# Patient Record
Sex: Male | Born: 1940 | Race: White | Hispanic: No | Marital: Married | Smoking: Current every day smoker
Health system: Southern US, Community
[De-identification: ages and names within clinical notes are randomized; demographics above are authoritative.]

## PROBLEM LIST (undated history)

## (undated) DIAGNOSIS — C140 Malignant neoplasm of pharynx, unspecified: Secondary | ICD-10-CM

## (undated) DIAGNOSIS — I10 Essential (primary) hypertension: Secondary | ICD-10-CM

## (undated) HISTORY — PX: BACK SURGERY: SHX140

## (undated) HISTORY — PX: PROSTATE SURGERY: SHX751

---

## 2005-01-26 ENCOUNTER — Emergency Department (HOSPITAL_COMMUNITY): Admission: EM | Admit: 2005-01-26 | Discharge: 2005-01-26 | Payer: Self-pay | Admitting: Emergency Medicine

## 2005-02-06 ENCOUNTER — Encounter (INDEPENDENT_AMBULATORY_CARE_PROVIDER_SITE_OTHER): Payer: Self-pay | Admitting: *Deleted

## 2005-02-06 ENCOUNTER — Inpatient Hospital Stay (HOSPITAL_COMMUNITY): Admission: RE | Admit: 2005-02-06 | Discharge: 2005-02-09 | Payer: Self-pay | Admitting: Urology

## 2006-07-25 ENCOUNTER — Ambulatory Visit: Admission: RE | Admit: 2006-07-25 | Discharge: 2006-10-07 | Payer: Self-pay | Admitting: Radiation Oncology

## 2006-07-30 ENCOUNTER — Ambulatory Visit (HOSPITAL_COMMUNITY): Admission: RE | Admit: 2006-07-30 | Discharge: 2006-07-30 | Payer: Self-pay | Admitting: Radiation Oncology

## 2006-08-14 LAB — CBC WITH DIFFERENTIAL/PLATELET
BASO%: 0.7 % (ref 0.0–2.0)
EOS%: 4.8 % (ref 0.0–7.0)
MCH: 29.6 pg (ref 28.0–33.4)
MCHC: 34.2 g/dL (ref 32.0–35.9)
MCV: 86.6 fL (ref 81.6–98.0)
MONO%: 13 % (ref 0.0–13.0)
RBC: 5.21 10*6/uL (ref 4.20–5.71)
RDW: 13.8 % (ref 11.2–14.6)
lymph#: 1.4 10*3/uL (ref 0.9–3.3)

## 2006-08-28 LAB — CBC WITH DIFFERENTIAL/PLATELET
Basophils Absolute: 0 10*3/uL (ref 0.0–0.1)
EOS%: 4.4 % (ref 0.0–7.0)
Eosinophils Absolute: 0.3 10*3/uL (ref 0.0–0.5)
HGB: 15.2 g/dL (ref 13.0–17.1)
MCV: 86.7 fL (ref 81.6–98.0)
MONO%: 14.3 % — ABNORMAL HIGH (ref 0.0–13.0)
NEUT#: 4.7 10*3/uL (ref 1.5–6.5)
RBC: 5.1 10*6/uL (ref 4.20–5.71)
RDW: 13.8 % (ref 11.2–14.6)
lymph#: 1 10*3/uL (ref 0.9–3.3)

## 2007-03-26 ENCOUNTER — Encounter: Admission: RE | Admit: 2007-03-26 | Discharge: 2007-03-26 | Payer: Self-pay | Admitting: Otolaryngology

## 2009-03-02 ENCOUNTER — Ambulatory Visit (HOSPITAL_COMMUNITY): Admission: RE | Admit: 2009-03-02 | Discharge: 2009-03-02 | Payer: Self-pay | Admitting: Radiation Oncology

## 2009-07-26 ENCOUNTER — Encounter: Admission: RE | Admit: 2009-07-26 | Discharge: 2009-07-26 | Payer: Self-pay | Admitting: Family Medicine

## 2010-07-05 ENCOUNTER — Ambulatory Visit: Admission: RE | Admit: 2010-07-05 | Discharge: 2010-07-05 | Payer: Self-pay | Admitting: Radiation Oncology

## 2011-01-23 ENCOUNTER — Ambulatory Visit
Admission: RE | Admit: 2011-01-23 | Discharge: 2011-01-23 | Disposition: A | Discharge status: Home / Self Care | Payer: 59 | Source: Ambulatory Visit | Attending: Otolaryngology | Admitting: Otolaryngology

## 2011-01-23 ENCOUNTER — Other Ambulatory Visit: Payer: Self-pay | Admitting: Otolaryngology

## 2011-01-23 DIAGNOSIS — I10 Essential (primary) hypertension: Secondary | ICD-10-CM

## 2011-01-23 DIAGNOSIS — Z8521 Personal history of malignant neoplasm of larynx: Secondary | ICD-10-CM

## 2011-02-24 NOTE — Discharge Summary (Signed)
Sergio Chavez, Sergio Chavez               ACCOUNT NO.:  [id number]   MEDICAL RECORD NO.:  [id number]          PATIENT TYPE:  INP   LOCATION:  0382                         FACILITY:  [facility]   PHYSICIAN:  Lindaann Slough, M.D.  DATE OF BIRTH:  January 23, 1941   DATE OF ADMISSION:  02/06/2005  DATE OF DISCHARGE:                                 DISCHARGE SUMMARY   DISCHARGE DIAGNOSES:  1.  Urinary retention.  2.  Benign prostatic hypertrophy.  3.  Chronic prostatitis.   PROCEDURE DONE:  Retropubic prostatectomy.   The patient is a 70 year old male who had been complaining of frequency,  hesitancy, voiding small amount of urine at a time for several weeks. On  December 22, 2004 he was unable to urinate and a Foley catheter was then  inserted in the bladder. He was started on Flomax and he failed three  voiding trials. Cystoscopy showed trilobar prostatic hypertrophy. Prostate  ultrasound showed a volume of 147 cc. PSA was also found to be elevated at  18.14. Prostate biopsy showed chronic prostatitis and the patient was  admitted on Feb 06, 2005 for retropubic prostatectomy.   On physical examination, his blood pressure was 186/111, pulse of 90,  respirations 14, temperature 96.7, height 5-9, and weight 166 pounds. Lungs  were clear to auscultation and percussion, heart regular rhythm. Abdomen  soft, nondistended, nontender, no CVA tenderness, and kidneys not palpable.  No hepatomegaly, no splenomegaly, no inguinal hernia, bowel sounds normal.  Penis uncircumcised, meatus normal, scrotum normal. No hydrocele, no  testicular mass. Cords and epididymides were within normal limits. On rectal  examination, sphincter tone is normal. Prostate is enlarged, firm,  nontender.   Hemoglobin on admission was 15.2, hematocrit 45.5, wbc's 7.4. Potassium was  5.5, BUN 12, creatinine 1.0, sodium 139. Urinalysis showed calcium oxalate  crystals, 7-10 rbc's, and 3-6 wbc's, and urine culture showed insignificant  growth. Chest x-ray showed emphysema without acute pulmonary process. He  also has asymmetric pleural parenchymal opacity in the right apex. His EKG  showed left ventricular hypertrophy and normal sinus rhythm.   The patient had a retropubic prostatectomy on Feb 06, 2005. Postoperative  course was uneventful. He remained afebrile. He had continuous bladder  irrigation that was discontinued on postoperative day #2 and the catheter  was draining well. However, he still had some blood clots and those blood  clots were irrigated out of the bladder. On Feb 09, 2005 he was afebrile, he  was eating well, his wound was clean and dry, and the catheter was draining  slightly bloody urine, and he was having regular bowel movements. He was  then discharged home on Cipro 250 mg twice a day and Percocet 5/325 one or  two tablets q.4h. p.r.n. for pain. Discharge diet regular.   The patient was instructed how to irrigate the Foley catheter if needed. He  is instructed not to do any lifting, straining, or driving until further  advised. He will be seen in the office next week for removal of the Foley  catheter and skin staples.   CONDITION ON DISCHARGE:  Improved.  MN/MEDQ  D:  02/09/2005  T:  02/09/2005  Job:  [id number]

## 2011-02-24 NOTE — H&P (Signed)
NAME:  Sergio Chavez, Sergio Chavez               ACCOUNT NO.:  [id number]   MEDICAL RECORD NO.:  [id number]          PATIENT TYPE:  INP   LOCATION:  0006                         FACILITY:  [facility]   PHYSICIAN:  Lindaann Slough, M.D.  DATE OF BIRTH:  1941/05/19   DATE OF ADMISSION:  02/06/2005  DATE OF DISCHARGE:                                HISTORY & PHYSICAL   CHIEF COMPLAINT:  Inability to urinate.   HISTORY OF PRESENT ILLNESS:  The patient is a 70 year old male who had been  complaining of frequency, hesitancy, voiding small amount of urine at a time  for several weeks. On December 22, 2004 he was unable to urinate and a Foley  catheter was inserted in the bladder and left indwelling. He was started on  Flomax and he failed three voiding trials. Cystoscopy showed trilobar  prostatic hypertrophy. Prostate ultrasound showed a volume of 147 mL. His  PSA was also found to be elevated at 18.14. Prostate biopsy showed chronic  prostatitis and no evidence of malignancy. The patient is scheduled today  for retropubic prostatectomy.   PAST MEDICAL HISTORY:  Negative. He has not seen a physician in about 10  years.   PAST SURGICAL HISTORY:  He had back surgery in 1992.   MEDICATIONS:  Aleve, Tylenol, Levaquin, and Pyridium.   ALLERGIES:  He has no known drug allergies.   SOCIAL HISTORY:  He is married, has 2 children. Has smoked for about 40  years and does not drink.   FAMILY HISTORY:  Positive for diabetes and heart disease. His father died of  a heart attack at age 34. His mother is 84 years old and doing well, and he  has one brother.   REVIEW OF SYSTEMS:  He has no cough, no shortness of breath, no hemoptysis.  CARDIOVASCULAR:  No palpitation, no chest pain. GI:  No nausea, no vomiting,  no diarrhea or constipation. GU:  As per history. All others are negative.   PHYSICAL EXAMINATION:  GENERAL:  This is a well-built 70 year old male in no  acute distress.  VITAL SIGNS:  Blood pressure is  186/111, pulse 90, respirations 14,  temperature 96.7. Height 5 feet 9 inches and weight 166 pounds.  MENTAL STATUS:  He is oriented to time, place, and person.  HEENT:  His head is normal. Pupils are equal and reactive to light and  accommodation. Ears, nose, and throat within normal limits.  NECK:  Supple. No cervical lymph nodes, no thyromegaly.  CHEST:  Symmetrical.  LUNGS:  Fully expanded and clear to percussion and auscultation.  HEART:  Regular rhythm. No murmur, no gallops.  ABDOMEN:  Soft, nondistended, nontender. He has no CVA tenderness. Kidneys  are not palpable. He has no hepatomegaly, no splenomegaly. He has no  inguinal hernia and bowel sounds normal.  GENITALIA:  Penis is uncircumcised. He has a Foley catheter that is now  draining clear urine. Scrotum is normal. He has no hydrocele, no testicular  mass. Cords and epididymis are within normal limits.  RECTAL:  He has external hemorrhoids. Sphincter tone is normal. Prostate is  enlarged,  firm, nontender. Seminal vesicles not palpable.  EXTREMITIES:  Within normal limits. He has no pedal edema, no deformities,  and he has good peripheral pulses.   ADMISSION DIAGNOSIS:  Urinary retention, benign prostatic hypertrophy.      MN/MEDQ  D:  02/06/2005  T:  02/06/2005  Job:  [id number]

## 2011-02-24 NOTE — Op Note (Signed)
NAME:  Sergio, Chavez               ACCOUNT NO.:  [id number]   MEDICAL RECORD NO.:  [id number]          PATIENT TYPE:  INP   LOCATION:  0006                         FACILITY:  [facility]   PHYSICIAN:  Lindaann Slough, M.D.  DATE OF BIRTH:  1941-09-13   DATE OF PROCEDURE:  02/06/2005  DATE OF DISCHARGE:                                 OPERATIVE REPORT   PREOPERATIVE DIAGNOSIS:  Urinary retention, benign prostatic hypertrophy.   POSTOPERATIVE DIAGNOSIS:  Urinary retention, benign prostatic hypertrophy.   PROCEDURE:  Simple retropubic prostatectomy.   ATTENDING SURGEON:  Su Grand, MD   RESIDENT SURGEON:  Rhae Lerner, MD   ANESTHESIA:  General endotracheal anesthesia.   COMPLICATIONS:  None.   ESTIMATED BLOOD LOSS:  400 mL.   INDICATIONS FOR PROCEDURE:  Sergio Chavez is a 70 year old gentleman with a  past medical history significant for BPH and urinary retention who was noted  on ultrasound to have a prostate of approximately 144 g. After discussing  the treatment options with the patient and the likelihood of success of  endoscopic versus open treatment of his BPH, he has elected to proceed with  an open simple retropubic prostatectomy.   PROCEDURE IN DETAIL:  The patient was brought to the operating room  following induction of general endotracheal anesthesia and was placed in the  supine position and prepped and draped in the usual sterile fashion. A low  midline incision was subsequently performed with a scalpel and dissection  was carried down to the level of the anterior rectus fascia. The anterior  rectus fascia was opened in the midline along the entire length of the  incision. The rectus muscles were separated and the space of Retzius entered  and developed. At this point in the procedure, a Bookwalter retractor was  placed with the retractor blade set for maximal exposure of the anterior  surface of the prostate. All retropubic fat was carefully dissected off of  the anterior surface of the prostate. A 2-0 Vicryl suture was subsequently  placed just distal to the bladder neck on the base of prostate to prevent  backbleeding. A second 2-0 silk suture was subsequently placed approximately  1 cm distal to the first one. Both of these sutures were subsequently tied  down. The prostate capsule was subsequently opened transversely along the  base of prostate between the two previously placed silk sutures. Once the  surface of the actual prostate tissue had been identified, a plane was  developed between the prostate adenoma and the prostate capsule using blunt  dissection with the index finger. This plane was developed until the  underlying gland had been completely separated from the capsule. The  prostate was then carefully separated from the external sphincter by  pinching off the tissue distally. In addition, the base of the prostate was  separated from the bladder neck bluntly in the same fashion. The gland was  subsequently removed from the operative field by first removing the right  lobe and then removing the remainder of the gland. A small amount of  prostate adenoma was identified within the operative field  still adherent to  the left bladder neck. This was carefully removed using sharp dissection  with Metzenbaum scissors. The previously placed 20 French Foley catheter was  subsequent removed and a 22 [place] Foley was passed through the urethra and  brought across the defect created by the newly removed prostate gland and  used to insufflate the bladder neck. The balloon was subsequently inflated  and after obtaining hemostasis with careful application of Bovie cautery,  closure of the capsule was initiated. The capsule was closed using a running  #0 Vicryl starting laterally on each side. Once the capsule had been  completely closed, the previously placed silk sutures were tied down to  provide additional support to the closure. At this point,  the wound was  irrigated and once hemostasis had been confirmed the Bookwalter retractor  was removed in its entirety. A JP drain was subsequently placed through a  separate stab incision in the left lower quadrant. The drain was sutured  into position using a 3-0 nylon. The rectus fascia was subsequently  reapproximated using a running #0 PDS. The skin was then reapproximated  using skin staples. At this point, the patient was allowed to awaken and the  case was ended. The Foley catheter was hooked up to continuous bladder  irrigation as a significant amount of hematuria was present. The patient  tolerated the procedure well and there were no complications. Please note  that Dr. Su Grand was present for the entire case and participated in all  aspects of procedure.      EG/MEDQ  D:  02/06/2005  T:  02/06/2005  Job:  [id number]

## 2011-07-06 ENCOUNTER — Ambulatory Visit: Payer: 59 | Admitting: Radiation Oncology

## 2015-02-13 ENCOUNTER — Emergency Department (HOSPITAL_COMMUNITY)
Admission: EM | Admit: 2015-02-13 | Discharge: 2015-02-13 | Disposition: A | Discharge status: Home / Self Care | Payer: Medicare Other | Attending: Emergency Medicine | Admitting: Emergency Medicine

## 2015-02-13 ENCOUNTER — Encounter (HOSPITAL_COMMUNITY): Payer: Self-pay | Admitting: Emergency Medicine

## 2015-02-13 DIAGNOSIS — S40261A Insect bite (nonvenomous) of right shoulder, initial encounter: Secondary | ICD-10-CM | POA: Diagnosis present

## 2015-02-13 DIAGNOSIS — Z720 Tobacco use: Secondary | ICD-10-CM | POA: Diagnosis not present

## 2015-02-13 DIAGNOSIS — Y9389 Activity, other specified: Secondary | ICD-10-CM | POA: Insufficient documentation

## 2015-02-13 DIAGNOSIS — Z85818 Personal history of malignant neoplasm of other sites of lip, oral cavity, and pharynx: Secondary | ICD-10-CM | POA: Insufficient documentation

## 2015-02-13 DIAGNOSIS — Y9289 Other specified places as the place of occurrence of the external cause: Secondary | ICD-10-CM | POA: Diagnosis not present

## 2015-02-13 DIAGNOSIS — Y998 Other external cause status: Secondary | ICD-10-CM | POA: Diagnosis not present

## 2015-02-13 DIAGNOSIS — W57XXXA Bitten or stung by nonvenomous insect and other nonvenomous arthropods, initial encounter: Secondary | ICD-10-CM | POA: Insufficient documentation

## 2015-02-13 DIAGNOSIS — I10 Essential (primary) hypertension: Secondary | ICD-10-CM | POA: Insufficient documentation

## 2015-02-13 HISTORY — DX: Malignant neoplasm of pharynx, unspecified: C14.0

## 2015-02-13 HISTORY — DX: Essential (primary) hypertension: I10

## 2015-02-13 MED ORDER — LIDOCAINE HCL (PF) 1 % IJ SOLN
5.0000 mL | Freq: Once | INTRAMUSCULAR | Status: AC
  Administered 2015-02-13: 5 mL
  Filled 2015-02-13: qty 5

## 2015-02-13 MED ORDER — DOXYCYCLINE HYCLATE 100 MG PO TABS
200.0000 mg | ORAL_TABLET | Freq: Once | ORAL | Status: AC
  Administered 2015-02-13: 200 mg via ORAL
  Filled 2015-02-13: qty 2

## 2015-02-13 NOTE — ED Provider Notes (Signed)
CSN: [id number]     Arrival date & time 02/13/15  1941 History  This chart was scribed for Clemens Catholic, NP working with No att. providers found by Mercy Moore, ED Scribe. This patient was seen in room [place] and the patient's care was started at 8:34 PM.   Chief Complaint  Patient presents with  . Tick Removal   The history is provided by the patient. No language interpreter was used.   HPI Comments: Sergio Chavez is a 74 y.o. male who presents to the Emergency Department requesting tick removal just below right axillary region. Patient reports that he's been outdoors a lot recently. Patient shares that he discovered the tick tonight when undressing for shower. Patient's wife applied alcohol and attempted to remove the tick with tweezers, but was unsuccessful. Patient suspects partial remains. Patient denies any pain at the site. He denies any pain, fever, chills, muscle aches or rash  Past Medical History  Diagnosis Date  . Hypertension   . Throat cancer    Past Surgical History  Procedure Laterality Date  . Back surgery    . Prostate surgery     No family history on file. History  Substance Use Topics  . Smoking status: Current Every Day Smoker  . Smokeless tobacco: Not on file  . Alcohol Use: No    Review of Systems  Constitutional: Negative for fever and chills.  Skin: Negative for rash.       Tick bite   Allergies  Review of patient's allergies indicates no known allergies.  Home Medications   Prior to Admission medications   Not on File   Triage Vitals: BP 146/66 mmHg  Pulse 85  Temp(Src) 98 F (36.7 C) (Oral)  Resp 15  SpO2 96% Physical Exam  Constitutional: He is oriented to person, place, and time. He appears well-developed and well-nourished. No distress.  HENT:  Head: Normocephalic and atraumatic.  Eyes: EOM are normal.  Neck: Neck supple. No tracheal deviation present.  Cardiovascular: Normal rate.   Pulmonary/Chest: Effort normal. No  respiratory distress.  Musculoskeletal: Normal range of motion.  Neurological: He is alert and oriented to person, place, and time.  Skin: Skin is warm and dry.  5 mm are of mild redness, right lateral chest wall near axilla. Small, insect parts noted at center, no tick body noted.  Psychiatric: He has a normal mood and affect. His behavior is normal.  Nursing note and vitals reviewed.   ED Course  Procedures (including critical care time)  COORDINATION OF CARE: 9:07 PM- Discussed treatment plan with patient at bedside and patient agreed to plan.   Labs Review Labs Reviewed - No data to display  Imaging Review No results found.   EKG Interpretation None      MDM   Final diagnoses:  Tick bite with subsequent removal of tick   74 yo presenting with tick bite.  Body removed at home, but mouth pieces remain in place. Case discussed with Dr. Regenia Skeeter. Skin numbed and mouth pieces removes and wound cleansed and irrigated. Doxycycline dose given in the ED.  Tick exposure less than 24 hours.  No rashes, fevers or muscle aches reported.  Pt is well-appearing, in no acute distress and vital signs reviewed and not concerning. He appears safe to be discharged.  Discharge include follow-up with their PCP.  Return precautions provided.  Pt aware of plan and in agreement.   I personally performed the services described in this documentation, which was scribed  in my presence. The recorded information has been reviewed and is accurate.  Filed Vitals:   02/13/15 1954  BP: 146/66  Pulse: 85  Temp: 98 F (36.7 C)  TempSrc: Oral  Resp: 15  SpO2: 96%   Meds given in ED:  Medications  lidocaine (PF) (XYLOCAINE) 1 % injection 5 mL (5 mLs Infiltration Given by Other 02/13/15 2133)  doxycycline (VIBRA-TABS) tablet 200 mg (200 mg Oral Given 02/13/15 2202)    Discharge Medication List as of 02/13/2015  9:50 PM        Britt Bottom, NP 02/14/15 1811  Sherwood Gambler, MD 02/16/15 1527

## 2015-02-13 NOTE — Discharge Instructions (Signed)
Please follow the directions provided. Be sure to follow-up with your primary care doctor to make sure you're getting better. Keep the area clean and dry. Change your dressing daily. Use warm soapy water soaks until the area is completely healed. Don't hesitate to return for any new, worsening, or concerning symptoms.   WHEN SHOULD YOU SEEK MEDICAL CARE?  Contact your health care provider if you are unable to remove a tick from your skin or if a part of the tick breaks off and is stuck in the skin.  After a tick bite, you need to be aware of signs and symptoms that could be related to diseases spread by ticks. Contact your health care provider if you develop any of the following in the days or weeks after the tick bite:  Unexplained fever.  Rash. A circular rash that appears days or weeks after the tick bite may indicate the possibility of Lyme disease. The rash may resemble a target with a bull's-eye and may occur at a different part of your body than the tick bite.  Redness and swelling in the area of the tick bite.  Tender, swollen lymph glands.  Diarrhea.  Weight loss.  Cough.  Fatigue.  Muscle, joint, or bone pain.  Abdominal pain.  Headache.  Lethargy or a change in your level of consciousness.  Difficulty walking or moving your legs.  Numbness in the legs.  Paralysis.  Shortness of breath.  Confusion.  Repeated vomiting.

## 2015-02-13 NOTE — ED Notes (Signed)
Pt from home c/o a tick bite that occurred sometime between yesterday and this morning. Pt reports family attempted to remove some of tick but believes some remains. Pt denies pain, fever, or rashes.

## 2016-02-28 ENCOUNTER — Other Ambulatory Visit: Payer: Self-pay | Admitting: Otolaryngology

## 2016-02-28 DIAGNOSIS — J3801 Paralysis of vocal cords and larynx, unilateral: Secondary | ICD-10-CM

## 2016-03-02 ENCOUNTER — Ambulatory Visit
Admission: RE | Admit: 2016-03-02 | Discharge: 2016-03-02 | Disposition: A | Discharge status: Home / Self Care | Payer: Medicare Other | Source: Ambulatory Visit | Attending: Otolaryngology | Admitting: Otolaryngology

## 2016-03-02 DIAGNOSIS — J3801 Paralysis of vocal cords and larynx, unilateral: Secondary | ICD-10-CM

## 2016-03-02 MED ORDER — IOPAMIDOL (ISOVUE-300) INJECTION 61%
75.0000 mL | Freq: Once | INTRAVENOUS | Status: AC | PRN
  Administered 2016-03-02: 75 mL via INTRAVENOUS

## 2019-10-27 ENCOUNTER — Emergency Department (HOSPITAL_BASED_OUTPATIENT_CLINIC_OR_DEPARTMENT_OTHER): Payer: Medicare Other

## 2019-10-27 ENCOUNTER — Emergency Department (HOSPITAL_BASED_OUTPATIENT_CLINIC_OR_DEPARTMENT_OTHER)
Admission: EM | Admit: 2019-10-27 | Discharge: 2019-10-27 | Disposition: A | Discharge status: Home / Self Care | Payer: Medicare Other | Attending: Emergency Medicine | Admitting: Emergency Medicine

## 2019-10-27 ENCOUNTER — Ambulatory Visit
Admission: EM | Admit: 2019-10-27 | Discharge: 2019-10-27 | Disposition: A | Discharge status: Home / Self Care | Payer: Medicare Other | Source: Home / Self Care

## 2019-10-27 ENCOUNTER — Other Ambulatory Visit: Payer: Self-pay

## 2019-10-27 ENCOUNTER — Encounter (HOSPITAL_BASED_OUTPATIENT_CLINIC_OR_DEPARTMENT_OTHER): Payer: Self-pay | Admitting: *Deleted

## 2019-10-27 DIAGNOSIS — Y939 Activity, unspecified: Secondary | ICD-10-CM | POA: Diagnosis not present

## 2019-10-27 DIAGNOSIS — Y999 Unspecified external cause status: Secondary | ICD-10-CM | POA: Insufficient documentation

## 2019-10-27 DIAGNOSIS — Z8521 Personal history of malignant neoplasm of larynx: Secondary | ICD-10-CM | POA: Diagnosis not present

## 2019-10-27 DIAGNOSIS — F17210 Nicotine dependence, cigarettes, uncomplicated: Secondary | ICD-10-CM | POA: Diagnosis not present

## 2019-10-27 DIAGNOSIS — S0080XA Unspecified superficial injury of other part of head, initial encounter: Secondary | ICD-10-CM | POA: Diagnosis present

## 2019-10-27 DIAGNOSIS — Y929 Unspecified place or not applicable: Secondary | ICD-10-CM | POA: Insufficient documentation

## 2019-10-27 DIAGNOSIS — I10 Essential (primary) hypertension: Secondary | ICD-10-CM | POA: Insufficient documentation

## 2019-10-27 DIAGNOSIS — S022XXA Fracture of nasal bones, initial encounter for closed fracture: Secondary | ICD-10-CM | POA: Insufficient documentation

## 2019-10-27 DIAGNOSIS — S0993XA Unspecified injury of face, initial encounter: Secondary | ICD-10-CM

## 2019-10-27 DIAGNOSIS — W010XXA Fall on same level from slipping, tripping and stumbling without subsequent striking against object, initial encounter: Secondary | ICD-10-CM | POA: Diagnosis not present

## 2019-10-27 NOTE — ED Triage Notes (Signed)
Pt c/o fall with nose injury x 3 hrs ago

## 2019-10-27 NOTE — ED Provider Notes (Signed)
[facility] Emergency Department Provider Note MRN:  [id number]  Arrival date & time: 10/27/19     Chief Complaint   Head Injury   History of Present Illness   Sergio Chavez is a 79 y.o. year-old male with a history of hypertension presenting to the ED with chief complaint of head injury.  Patient explains that he lost his balance today and tripped, falling forward onto his face.  No loss of consciousness, no nausea or vomiting since the fall.  Denies neck pain, no chest pain or shortness of breath, no abdominal pain, no numbness or weakness to the arms or legs, no headache.  Endorsing isolated nose pain.  Fall occurred 6 hours ago.  Pain is mild to moderate in severity, worse with palpation.  Had some bleeding from the nose initially but it stopped with time.  Review of Systems  A complete 10 system review of systems was obtained and all systems are negative except as noted in the HPI and PMH.   Patient's Health History    Past Medical History:  Diagnosis Date  . Hypertension   . Throat cancer [facility])     Past Surgical History:  Procedure Laterality Date  . BACK SURGERY    . PROSTATE SURGERY      No family history on file.  Social History   Socioeconomic History  . Marital status: Married    Spouse name: Not on file  . Number of children: Not on file  . Years of education: Not on file  . Highest education level: Not on file  Occupational History  . Not on file  Tobacco Use  . Smoking status: Current Every Day Smoker    Packs/day: 0.50  . Smokeless tobacco: Never Used  Substance and Sexual Activity  . Alcohol use: No  . Drug use: Not on file  . Sexual activity: Not on file  Other Topics Concern  . Not on file  Social History Narrative  . Not on file   Social Determinants of Health   Financial Resource Strain:   . Difficulty of Paying Living Expenses: Not on file  Food Insecurity:   . Worried About Charity fundraiser in the Last  Year: Not on file  . Ran Out of Food in the Last Year: Not on file  Transportation Needs:   . Lack of Transportation (Medical): Not on file  . Lack of Transportation (Non-Medical): Not on file  Physical Activity:   . Days of Exercise per Week: Not on file  . Minutes of Exercise per Session: Not on file  Stress:   . Feeling of Stress : Not on file  Social Connections:   . Frequency of Communication with Friends and Family: Not on file  . Frequency of Social Gatherings with Friends and Family: Not on file  . Attends Religious Services: Not on file  . Active Member of Clubs or Organizations: Not on file  . Attends Archivist Meetings: Not on file  . Marital Status: Not on file  Intimate Partner Violence:   . Fear of Current or Ex-Partner: Not on file  . Emotionally Abused: Not on file  . Physically Abused: Not on file  . Sexually Abused: Not on file     Physical Exam  Vital Signs and Nursing Notes reviewed Vitals:   10/27/19 1843  BP: (!) 168/86  Pulse: 92  Resp: 18  Temp: 97.8 F (36.6 C)  SpO2: 99%    CONSTITUTIONAL:  Well-appearing, NAD NEURO:  Alert and oriented x 3, no focal deficits EYES:  eyes equal and reactive, normal extraocular movements, no entrapment ENT/NECK:  no LAD, no JVD; bruising and abrasion to the nose, which is well aligned, no septal hematoma CARDIO: Regular rate, well-perfused, normal S1 and S2 PULM:  CTAB no wheezing or rhonchi GI/GU:  normal bowel sounds, non-distended, non-tender MSK/SPINE:  No gross deformities, no edema SKIN:  no rash, atraumatic PSYCH:  Appropriate speech and behavior  Diagnostic and Interventional Summary    EKG Interpretation  Date/Time:    Ventricular Rate:    PR Interval:    QRS Duration:   QT Interval:    QTC Calculation:   R Axis:     Text Interpretation:        Labs Reviewed - No data to display  DG Nasal Bones  Final Result      Medications - No data to display   Procedures  /  Critical  Care Procedures  ED Course and Medical Decision Making  I have reviewed the triage vital signs, the nursing notes, and pertinent available records from the EMR.  Pertinent labs & imaging results that were available during my care of the patient were reviewed by me and considered in my medical decision making (see below for details).     Isolated nose injury after mechanical fall today, reassuring vital signs, apart from the nose there is no evidence of trauma today, no cervical spinal tenderness, normal range of motion of the neck.  Normal extraocular movements, no tenderness to the facial bones, normal neurological exam, no anticoagulation, no loss consciousness, no vomiting, no indication for CNS imaging today.  Plain film confirms nasal bone fracture but this is minimally displaced and will heal well.  No septal hematoma, bleeding is controlled, appropriate for discharge.    Barth Kirks. Sedonia Small, [place] [email or web address]  Final Clinical Impressions(s) / ED Diagnoses     ICD-10-CM   1. Facial injury, initial encounter  S09.93XA   2. Closed fracture of nasal bone, initial encounter  S02.2XXA     ED Discharge Orders    None       Discharge Instructions Discussed with and Provided to Patient:     Discharge Instructions     You were evaluated in the Emergency Department and after careful evaluation, we did not find any emergent condition requiring admission or further testing in the hospital.  Your x-ray revealed a broken nose today.  Your exam is otherwise reassuring.  We recommend cold compresses for the next 2 days to help with the swelling.  We recommend Tylenol for discomfort.  Please return to the Emergency Department if you experience any worsening of your condition.  We encourage you to follow up with a primary care provider.  Thank you for allowing [place] to be a part of your care.        Maudie Flakes,  MD 10/27/19 2126

## 2019-10-27 NOTE — ED Notes (Signed)
Pt reports that 'he is good' and does not want his BP rechecked. Pt with jackets on and ready to go. Ambulatory out of the dept without difficulty with son in law.

## 2019-10-27 NOTE — Discharge Instructions (Addendum)
You were evaluated in the Emergency Department and after careful evaluation, we did not find any emergent condition requiring admission or further testing in the hospital.  Your x-ray revealed a broken nose today.  Your exam is otherwise reassuring.  We recommend cold compresses for the next 2 days to help with the swelling.  We recommend Tylenol for discomfort.  Please return to the Emergency Department if you experience any worsening of your condition.  We encourage you to follow up with a primary care provider.  Thank you for allowing [place] to be a part of your care.

## 2019-10-27 NOTE — ED Notes (Signed)
Pt has bloody nose and forehead, did hit head when falling.  Spoke with APP about patient, and encouraged them to follow up at ER for assessment/CT scan due to patient's age/risk factors.  Verbalized understanding.

## 2020-03-18 ENCOUNTER — Ambulatory Visit: Payer: Medicare Other | Attending: Internal Medicine

## 2020-03-18 DIAGNOSIS — Z23 Encounter for immunization: Secondary | ICD-10-CM

## 2020-03-18 NOTE — Progress Notes (Signed)
   Covid-19 Vaccination Clinic  Name:  LAVERE STORK    MRN: [id number] DOB: 06-Mar-1941  03/18/2020  Mr. Creswell was observed post Covid-19 immunization for 15 minutes without incident. He was provided with Vaccine Information Sheet and instruction to access the V-Safe system.   Mr. Geers was instructed to call 911 with any severe reactions post vaccine: Marland Kitchen Difficulty breathing  . Swelling of face and throat  . A fast heartbeat  . A bad rash all over body  . Dizziness and weakness   Immunizations Administered    Name Date Dose VIS Date Route   Pfizer COVID-19 Vaccine 03/18/2020 10:15 AM 0.3 mL 12/03/2018 Intramuscular   Manufacturer: [employer]   Lot: [id number]   [place]: [id number]

## 2020-04-08 ENCOUNTER — Ambulatory Visit: Payer: Medicare Other | Attending: Internal Medicine

## 2020-04-08 DIAGNOSIS — Z23 Encounter for immunization: Secondary | ICD-10-CM

## 2020-04-08 NOTE — Progress Notes (Signed)
   Covid-19 Vaccination Clinic  Name:  Sergio Chavez    MRN: [id number] DOB: 09-03-1941  04/08/2020  [name] was observed post Covid-19 immunization for 15 minutes without incident. He was provided with Vaccine Information Sheet and instruction to access the V-Safe system.   [name] was instructed to call 911 with any severe reactions post vaccine: Marland Kitchen Difficulty breathing  . Swelling of face and throat  . A fast heartbeat  . A bad rash all over body  . Dizziness and weakness   Immunizations Administered    Name Date Dose VIS Date Route   Pfizer COVID-19 Vaccine 04/08/2020  9:41 AM 0.3 mL 12/03/2018 Intramuscular   Manufacturer: [place]   Lot: [id number]   [place]: [id number]

## 2020-12-03 ENCOUNTER — Inpatient Hospital Stay (HOSPITAL_BASED_OUTPATIENT_CLINIC_OR_DEPARTMENT_OTHER)
Admission: EM | Admit: 2020-12-03 | Discharge: 2020-12-08 | DRG: 522 | Disposition: A | Discharge status: Skilled Nursing Facility | Payer: Medicare Other | Attending: Internal Medicine | Admitting: Internal Medicine

## 2020-12-03 ENCOUNTER — Emergency Department (HOSPITAL_BASED_OUTPATIENT_CLINIC_OR_DEPARTMENT_OTHER): Payer: Medicare Other

## 2020-12-03 ENCOUNTER — Other Ambulatory Visit: Payer: Self-pay

## 2020-12-03 ENCOUNTER — Encounter (HOSPITAL_BASED_OUTPATIENT_CLINIC_OR_DEPARTMENT_OTHER): Payer: Self-pay

## 2020-12-03 DIAGNOSIS — M898X9 Other specified disorders of bone, unspecified site: Secondary | ICD-10-CM | POA: Diagnosis present

## 2020-12-03 DIAGNOSIS — M80052A Age-related osteoporosis with current pathological fracture, left femur, initial encounter for fracture: Principal | ICD-10-CM | POA: Diagnosis present

## 2020-12-03 DIAGNOSIS — D62 Acute posthemorrhagic anemia: Secondary | ICD-10-CM | POA: Diagnosis not present

## 2020-12-03 DIAGNOSIS — W010XXA Fall on same level from slipping, tripping and stumbling without subsequent striking against object, initial encounter: Secondary | ICD-10-CM | POA: Diagnosis present

## 2020-12-03 DIAGNOSIS — Z20822 Contact with and (suspected) exposure to covid-19: Secondary | ICD-10-CM | POA: Diagnosis present

## 2020-12-03 DIAGNOSIS — Z923 Personal history of irradiation: Secondary | ICD-10-CM

## 2020-12-03 DIAGNOSIS — Z85819 Personal history of malignant neoplasm of unspecified site of lip, oral cavity, and pharynx: Secondary | ICD-10-CM

## 2020-12-03 DIAGNOSIS — Z79899 Other long term (current) drug therapy: Secondary | ICD-10-CM

## 2020-12-03 DIAGNOSIS — W19XXXA Unspecified fall, initial encounter: Secondary | ICD-10-CM

## 2020-12-03 DIAGNOSIS — E222 Syndrome of inappropriate secretion of antidiuretic hormone: Secondary | ICD-10-CM | POA: Diagnosis present

## 2020-12-03 DIAGNOSIS — E871 Hypo-osmolality and hyponatremia: Secondary | ICD-10-CM

## 2020-12-03 DIAGNOSIS — Y92019 Unspecified place in single-family (private) house as the place of occurrence of the external cause: Secondary | ICD-10-CM

## 2020-12-03 DIAGNOSIS — Z833 Family history of diabetes mellitus: Secondary | ICD-10-CM

## 2020-12-03 DIAGNOSIS — F17210 Nicotine dependence, cigarettes, uncomplicated: Secondary | ICD-10-CM | POA: Diagnosis present

## 2020-12-03 DIAGNOSIS — S72001A Fracture of unspecified part of neck of right femur, initial encounter for closed fracture: Secondary | ICD-10-CM | POA: Diagnosis present

## 2020-12-03 DIAGNOSIS — S72002A Fracture of unspecified part of neck of left femur, initial encounter for closed fracture: Secondary | ICD-10-CM

## 2020-12-03 DIAGNOSIS — E785 Hyperlipidemia, unspecified: Secondary | ICD-10-CM | POA: Diagnosis present

## 2020-12-03 DIAGNOSIS — S72009A Fracture of unspecified part of neck of unspecified femur, initial encounter for closed fracture: Secondary | ICD-10-CM | POA: Diagnosis present

## 2020-12-03 DIAGNOSIS — I10 Essential (primary) hypertension: Secondary | ICD-10-CM | POA: Diagnosis present

## 2020-12-03 LAB — CBC WITH DIFFERENTIAL/PLATELET
Abs Immature Granulocytes: 0.05 10*3/uL (ref 0.00–0.07)
Basophils Absolute: 0.1 10*3/uL (ref 0.0–0.1)
Basophils Relative: 1 %
Eosinophils Absolute: 0.1 10*3/uL (ref 0.0–0.5)
Eosinophils Relative: 1 %
HCT: 42.5 % (ref 39.0–52.0)
Hemoglobin: 14.5 g/dL (ref 13.0–17.0)
Immature Granulocytes: 0 %
Lymphocytes Relative: 5 %
Lymphs Abs: 0.6 10*3/uL — ABNORMAL LOW (ref 0.7–4.0)
MCH: 29.4 pg (ref 26.0–34.0)
MCHC: 34.1 g/dL (ref 30.0–36.0)
MCV: 86.2 fL (ref 80.0–100.0)
Monocytes Absolute: 0.7 10*3/uL (ref 0.1–1.0)
Monocytes Relative: 6 %
Neutro Abs: 10 10*3/uL — ABNORMAL HIGH (ref 1.7–7.7)
Neutrophils Relative %: 87 %
Platelets: 284 10*3/uL (ref 150–400)
RBC: 4.93 MIL/uL (ref 4.22–5.81)
RDW: 13.5 % (ref 11.5–15.5)
WBC: 11.6 10*3/uL — ABNORMAL HIGH (ref 4.0–10.5)
nRBC: 0 % (ref 0.0–0.2)

## 2020-12-03 LAB — BASIC METABOLIC PANEL WITH GFR
Anion gap: 10 (ref 5–15)
BUN: 19 mg/dL (ref 8–23)
CO2: 24 mmol/L (ref 22–32)
Calcium: 9.9 mg/dL (ref 8.9–10.3)
Chloride: 95 mmol/L — ABNORMAL LOW (ref 98–111)
Creatinine, Ser: 1.09 mg/dL (ref 0.61–1.24)
GFR, Estimated: >60 mL/min
Glucose, Bld: 132 mg/dL — ABNORMAL HIGH (ref 70–99)
Potassium: 4.1 mmol/L (ref 3.5–5.1)
Sodium: 129 mmol/L — ABNORMAL LOW (ref 135–145)

## 2020-12-03 LAB — PROTIME-INR
INR: 1 (ref 0.8–1.2)
Prothrombin Time: 13 s (ref 11.4–15.2)

## 2020-12-03 LAB — RESP PANEL BY RT-PCR (FLU A&B, COVID) ARPGX2
Influenza A by PCR: NEGATIVE
Influenza B by PCR: NEGATIVE
SARS Coronavirus 2 by RT PCR: NEGATIVE

## 2020-12-03 MED ORDER — FENTANYL CITRATE (PF) 100 MCG/2ML IJ SOLN
50.0000 ug | Freq: Once | INTRAMUSCULAR | Status: AC
  Administered 2020-12-03: 50 ug via INTRAVENOUS
  Filled 2020-12-03: qty 2

## 2020-12-03 MED ORDER — MORPHINE SULFATE (PF) 4 MG/ML IV SOLN
4.0000 mg | INTRAVENOUS | Status: AC | PRN
  Administered 2020-12-03 (×2): 4 mg via INTRAVENOUS
  Filled 2020-12-03 (×2): qty 1

## 2020-12-03 NOTE — ED Notes (Addendum)
States he fell today, fell onto concrete, was able to get up on his home, denied hitting his head, presents primarily with left hip pain, ice pack applied to left hit for comfort. No shortening or external rotation noted of LLE. Physical examination does reveal LLE is swollen, skin temp WNL, 1+ rt pedal pulse, 1+ rt post tib pulse, 1+ rt pop pulse noted, nml capillary refill also noted, able to plantar and dorsal flex on rt without difficulty. No discoloration, bruising or signs of injury noted at left hip during visual examiation

## 2020-12-03 NOTE — ED Notes (Signed)
Patient and Visitor made aware of Transfer Process; Patient signed Transfer Consent Form willingly.

## 2020-12-03 NOTE — ED Notes (Signed)
Safety measures remain place, cont on cardiac monitor

## 2020-12-03 NOTE — ED Triage Notes (Signed)
Pt states he tripped/fell today-pain to left hip-reports unable to bear weight-NAD-to triage in w/c

## 2020-12-03 NOTE — ED Notes (Signed)
Phone Handoff report provided to [facility].

## 2020-12-03 NOTE — ED Provider Notes (Signed)
[place] EMERGENCY DEPARTMENT Provider Note   CSN: [id number] Arrival date & time: 12/03/20  1745     History Chief Complaint  Patient presents with  . Fall    Sergio Chavez is a 80 y.o. male.  He is here for evaluation of injuries from a fall today.  He said he was bending over to get something and he stumbled and landed on his left hip.  He denies any loss of consciousness and did not strike his head or his neck.  He is not on blood thinners.  He said he is unable to ambulate on that leg since then due to hip pain.  No numbness or weakness.  No chest or abdominal pain no neck or back pain  The history is provided by the patient and a relative.  Hip Pain This is a new problem. The current episode started 1 to 2 hours ago. The problem occurs constantly. The problem has not changed since onset.Pertinent negatives include no chest pain, no abdominal pain, no headaches and no shortness of breath. The symptoms are aggravated by bending and twisting. Nothing relieves the symptoms. He has tried nothing for the symptoms. The treatment provided no relief.       Past Medical History:  Diagnosis Date  . Hypertension   . Throat cancer ([place])     There are no problems to display for this patient.   Past Surgical History:  Procedure Laterality Date  . BACK SURGERY    . PROSTATE SURGERY         No family history on file.  Social History   Tobacco Use  . Smoking status: Current Every Day Smoker    Packs/day: 0.50  . Smokeless tobacco: Never Used  Vaping Use  . Vaping Use: Never used  Substance Use Topics  . Alcohol use: No  . Drug use: Never    Home Medications Prior to Admission medications   Medication Sig Start Date End Date Taking? Authorizing Provider  acetaminophen (TYLENOL) 500 MG tablet Take 1,000 mg by mouth every 6 (six) hours as needed for moderate pain.    [provider]  amLODipine (NORVASC) 10 MG tablet Take 10 mg by mouth daily.     [provider]  lisinopril-hydrochlorothiazide (PRINZIDE,ZESTORETIC) 20-25 MG per tablet Take 1 tablet by mouth daily.    [provider]  pravastatin (PRAVACHOL) 40 MG tablet Take 40 mg by mouth daily.    [provider]    Allergies    Patient has no known allergies.  Review of Systems   Review of Systems  Constitutional: Negative for fever.  HENT: Negative for sore throat.   Eyes: Negative for visual disturbance.  Respiratory: Negative for shortness of breath.   Cardiovascular: Positive for leg swelling (left). Negative for chest pain.  Gastrointestinal: Negative for abdominal pain.  Genitourinary: Negative for dysuria.  Musculoskeletal: Negative for neck pain.  Skin: Negative for rash.  Neurological: Negative for headaches.    Physical Exam Updated Vital Signs BP (!) 127/95 (BP Location: Right Arm)   Pulse 81   Temp 97.8 F (36.6 C)   Resp 16   Ht 5\' 7"  (1.702 m)   Wt 63.5 kg   SpO2 95%   BMI 21.93 kg/m   Physical Exam Vitals and nursing note reviewed.  Constitutional:      Appearance: Normal appearance. He is well-developed and well-nourished.  HENT:     Head: Normocephalic and atraumatic.  Eyes:  Conjunctiva/sclera: Conjunctivae normal.  Cardiovascular:     Rate and Rhythm: Normal rate and regular rhythm.     Heart sounds: No murmur heard.   Pulmonary:     Effort: Pulmonary effort is normal. No respiratory distress.     Breath sounds: Normal breath sounds.  Abdominal:     Palpations: Abdomen is soft.     Tenderness: There is no abdominal tenderness.  Musculoskeletal:        General: Tenderness present. No deformity.     Cervical back: Neck supple.     Left lower leg: Edema present.     Comments: He has full range of motion of his upper extremities without any pain or limitations.  Full range of motion right lower extremity without any pain or limitations.  Left leg there is pain at the hip with axial loading.  Not much  pain with internal/external rotation.  Knee and ankle nontender.  Has slight edema to that left leg though nonpitting.  Distal pulses and sensation intact.  Skin:    General: Skin is warm and dry.     Capillary Refill: Capillary refill takes less than 2 seconds.  Neurological:     General: No focal deficit present.     Mental Status: He is alert.     Sensory: No sensory deficit.     Motor: No weakness.  Psychiatric:        Mood and Affect: Mood and affect normal.     ED Results / Procedures / Treatments   Labs (all labs ordered are listed, but only abnormal results are displayed) Labs Reviewed  BASIC METABOLIC PANEL - Abnormal; Notable for the following components:      Result Value   Sodium 129 (*)    Chloride 95 (*)    Glucose, Bld 132 (*)    All other components within normal limits  CBC WITH DIFFERENTIAL/PLATELET - Abnormal; Notable for the following components:   WBC 11.6 (*)    Neutro Abs 10.0 (*)    Lymphs Abs 0.6 (*)    All other components within normal limits  RESP PANEL BY RT-PCR (FLU A&B, COVID) [id number]  PROTIME-INR    EKG EKG Interpretation  Date/Time:  Friday December 03 2020 19:06:36 EST Ventricular Rate:  81 PR Interval:    QRS Duration: 93 QT Interval:  393 QTC Calculation: 457 R Axis:   79 Text Interpretation: Sinus rhythm Borderline repolarization abnormality No significant change since prior 4/06 Confirmed by Aletta Edouard [phone]) on 12/03/2020 7:08:57 PM   Radiology DG Chest 1 View  Result Date: 12/03/2020 CLINICAL DATA:  Fall today. EXAM: CHEST  1 VIEW COMPARISON:  January 23, 2011. FINDINGS: The heart size and mediastinal contours are within normal limits. Both lungs are clear. No pneumothorax or pleural effusion is noted. Hyperexpansion of the lungs is noted. The visualized skeletal structures are unremarkable. IMPRESSION: No active disease. Aortic Atherosclerosis (ICD10-I70.0). Electronically Signed   By: Marijo Conception M.D.   On: 12/03/2020  19:40   DG Hip Unilat With Pelvis 2-3 Views Left  Result Date: 12/03/2020 CLINICAL DATA:  Left hip pain after fall. EXAM: DG HIP (WITH OR WITHOUT PELVIS) 2-3V LEFT COMPARISON:  None. FINDINGS: Moderately angulated fracture is seen involving the proximal left femoral neck. Right hip is unremarkable. IMPRESSION: Moderately angulated proximal left femoral neck fracture. Electronically Signed   By: Marijo Conception M.D.   On: 12/03/2020 19:38    Procedures Procedures   Medications Ordered in ED Medications  morphine 4 MG/ML injection 4 mg (4 mg Intravenous Given 12/03/20 1914)  fentaNYL (SUBLIMAZE) injection 50 mcg (has no administration in time range)    ED Course  I have reviewed the triage vital signs and the nursing notes.  Pertinent labs & imaging results that were available during my care of the patient were reviewed by me and considered in my medical decision making (see chart for details).  Clinical Course as of 12/03/20 2149  Fri Dec 03, 2020  9373 X-ray interpreted by me as left femoral neck fracture [MB]  2008 Received a call from [facility] that Dr. Mardelle Matte is scrubbed into the operating room but he is reviewed the case and asked that the patient be admitted to [facility]. [MB]  2029 Discussed with [facility] hospitalist Dr. Daryel November who will put the patient in for admission to [facility]. [MB]  2148 Received a call from Dr. Luanna Cole PA. Apparently now they need him on [facility] as Dr. Marcelino Scot is going to do the surgery. Patient was reluctant to be admitted to [facility] as it would be more difficult for his wife to get into see him but he ultimately is agreeable. I have messaged [facility] attending Dr. Kai Levins regarding the change. [MB]    Clinical Course User Index [MB] Hayden Rasmussen, MD   MDM Rules/Calculators/A&P                         Delilah Shan patient complains of left hip pain after fall; this involves an extensive number of treatment Options and is a  complaint that carries with it a high risk of complications and Morbidity. The differential includes fracture, dislocation, contusion  I ordered, reviewed and interpreted labs, which included CBC with mildly elevated white count likely reactive, normal hemoglobin, normal platelets, chemistries with low sodium seen on priors, normal renal function, normal coags.  Covid testing pending I ordered medication IV pain medication with improvement in his pain I ordered imaging studies which included chest x-ray and pelvis with left hip and I independently    visualized and interpreted imaging which showed left femoral neck fracture Additional history obtained from patient son Previous records obtained and reviewed in epic no recent admissions I consulted Dr. Mardelle Matte orthopedics and Dr. Tonie Griffith and discussed lab and imaging findings  Critical Interventions: None  After the interventions stated above, I reevaluated the patient and found patient's pain to be somewhat improved.  He is agreeable to admission and further discussion with orthopedics regarding operative repair of his left hip fracture.   Final Clinical Impression(s) / ED Diagnoses Final diagnoses:  Closed fracture of left hip, initial encounter [facility])  Fall, initial encounter  Hyponatremia    Rx / DC Orders ED Discharge Orders    None       Hayden Rasmussen, MD 12/03/20 2042

## 2020-12-03 NOTE — ED Notes (Signed)
Medicated for left hip pain per EDP orders, safety measures in place, sr x 2 up, family at side, on cont cardiac monitoring with cont POX monitoring and int NPB assessments

## 2020-12-03 NOTE — Progress Notes (Signed)
Called from [facility] regarding left femoral neck fracture.   Plan for transfer and surgery possibly tomorrow with Dr. Marcelino Scot, or Sunday with me.  Full consult to follow in the am.   Johnny Bridge, MD

## 2020-12-03 NOTE — ED Notes (Signed)
Patient placed on 3L [place] as this RN noted mild Hypoxia at 88-89% SPO2 on RA.

## 2020-12-03 NOTE — ED Notes (Signed)
Per patient.... To call Richardson Landry at [phone] for status update.

## 2020-12-03 NOTE — ED Notes (Signed)
Placed on cont cardiac monitoring with int NBP assessment and cont POX assessment. SR  X 2 up, call bell within reach, stretcher in lowest position, family at bedside

## 2020-12-04 ENCOUNTER — Inpatient Hospital Stay: Admit: 2020-12-04 | Payer: Medicare Other | Admitting: Orthopedic Surgery

## 2020-12-04 ENCOUNTER — Encounter (HOSPITAL_COMMUNITY): Payer: Self-pay | Admitting: Family Medicine

## 2020-12-04 ENCOUNTER — Inpatient Hospital Stay (HOSPITAL_COMMUNITY): Payer: Medicare Other | Admitting: Anesthesiology

## 2020-12-04 ENCOUNTER — Inpatient Hospital Stay (HOSPITAL_COMMUNITY): Payer: Medicare Other

## 2020-12-04 ENCOUNTER — Encounter (HOSPITAL_COMMUNITY)
Admission: EM | Disposition: A | Discharge status: Skilled Nursing Facility | Payer: Self-pay | Source: Home / Self Care | Attending: Internal Medicine

## 2020-12-04 ENCOUNTER — Other Ambulatory Visit: Payer: Self-pay

## 2020-12-04 DIAGNOSIS — F17210 Nicotine dependence, cigarettes, uncomplicated: Secondary | ICD-10-CM | POA: Diagnosis present

## 2020-12-04 DIAGNOSIS — Y92019 Unspecified place in single-family (private) house as the place of occurrence of the external cause: Secondary | ICD-10-CM | POA: Diagnosis not present

## 2020-12-04 DIAGNOSIS — S72002A Fracture of unspecified part of neck of left femur, initial encounter for closed fracture: Secondary | ICD-10-CM | POA: Diagnosis not present

## 2020-12-04 DIAGNOSIS — E222 Syndrome of inappropriate secretion of antidiuretic hormone: Secondary | ICD-10-CM | POA: Diagnosis present

## 2020-12-04 DIAGNOSIS — D62 Acute posthemorrhagic anemia: Secondary | ICD-10-CM | POA: Diagnosis not present

## 2020-12-04 DIAGNOSIS — W010XXA Fall on same level from slipping, tripping and stumbling without subsequent striking against object, initial encounter: Secondary | ICD-10-CM | POA: Diagnosis present

## 2020-12-04 DIAGNOSIS — M898X9 Other specified disorders of bone, unspecified site: Secondary | ICD-10-CM | POA: Diagnosis present

## 2020-12-04 DIAGNOSIS — E785 Hyperlipidemia, unspecified: Secondary | ICD-10-CM | POA: Diagnosis present

## 2020-12-04 DIAGNOSIS — E871 Hypo-osmolality and hyponatremia: Secondary | ICD-10-CM | POA: Diagnosis present

## 2020-12-04 DIAGNOSIS — Z833 Family history of diabetes mellitus: Secondary | ICD-10-CM | POA: Diagnosis not present

## 2020-12-04 DIAGNOSIS — I10 Essential (primary) hypertension: Secondary | ICD-10-CM | POA: Diagnosis present

## 2020-12-04 DIAGNOSIS — Z923 Personal history of irradiation: Secondary | ICD-10-CM | POA: Diagnosis not present

## 2020-12-04 DIAGNOSIS — Z79899 Other long term (current) drug therapy: Secondary | ICD-10-CM | POA: Diagnosis not present

## 2020-12-04 DIAGNOSIS — Z20822 Contact with and (suspected) exposure to covid-19: Secondary | ICD-10-CM | POA: Diagnosis present

## 2020-12-04 DIAGNOSIS — M80052A Age-related osteoporosis with current pathological fracture, left femur, initial encounter for fracture: Secondary | ICD-10-CM | POA: Diagnosis present

## 2020-12-04 DIAGNOSIS — S72009A Fracture of unspecified part of neck of unspecified femur, initial encounter for closed fracture: Secondary | ICD-10-CM | POA: Diagnosis present

## 2020-12-04 DIAGNOSIS — Z85819 Personal history of malignant neoplasm of unspecified site of lip, oral cavity, and pharynx: Secondary | ICD-10-CM | POA: Diagnosis not present

## 2020-12-04 HISTORY — PX: HIP ARTHROPLASTY: SHX981

## 2020-12-04 LAB — TYPE AND SCREEN
ABO/RH(D): AB POS
Antibody Screen: NEGATIVE

## 2020-12-04 LAB — BASIC METABOLIC PANEL
Anion gap: 10 (ref 5–15)
Anion gap: 9 (ref 5–15)
BUN: 18 mg/dL (ref 8–23)
BUN: 18 mg/dL (ref 8–23)
CO2: 26 mmol/L (ref 22–32)
CO2: 26 mmol/L (ref 22–32)
Calcium: 9.9 mg/dL (ref 8.9–10.3)
Calcium: 9.6 mg/dL (ref 8.9–10.3)
Chloride: 95 mmol/L — ABNORMAL LOW (ref 98–111)
Chloride: 95 mmol/L — ABNORMAL LOW (ref 98–111)
Creatinine, Ser: 1.11 mg/dL (ref 0.61–1.24)
Creatinine, Ser: 1.06 mg/dL (ref 0.61–1.24)
GFR, Estimated: >60 mL/min (ref >60)
GFR, Estimated: >60 mL/min (ref >60)
Glucose, Bld: 123 mg/dL — ABNORMAL HIGH (ref 70–99)
Glucose, Bld: 111 mg/dL — ABNORMAL HIGH (ref 70–99)
Potassium: 4.7 mmol/L (ref 3.5–5.1)
Potassium: 4.1 mmol/L (ref 3.5–5.1)
Sodium: 131 mmol/L — ABNORMAL LOW (ref 135–145)
Sodium: 130 mmol/L — ABNORMAL LOW (ref 135–145)

## 2020-12-04 LAB — HEPATIC FUNCTION PANEL
ALT: 12 U/L (ref 0–44)
AST: 20 U/L (ref 15–41)
Albumin: 3.7 g/dL (ref 3.5–5.0)
Alkaline Phosphatase: 68 U/L (ref 38–126)
Bilirubin, Direct: 0.2 mg/dL (ref 0.0–0.2)
Indirect Bilirubin: 0.8 mg/dL (ref 0.3–0.9)
Total Bilirubin: 1 mg/dL (ref 0.3–1.2)
Total Protein: 6.2 g/dL — ABNORMAL LOW (ref 6.5–8.1)

## 2020-12-04 LAB — CBC WITH DIFFERENTIAL/PLATELET
Abs Immature Granulocytes: 0.05 10*3/uL (ref 0.00–0.07)
Basophils Absolute: 0.1 10*3/uL (ref 0.0–0.1)
Basophils Relative: 1 %
Eosinophils Absolute: 0.1 10*3/uL (ref 0.0–0.5)
Eosinophils Relative: 1 %
HCT: 39.3 % (ref 39.0–52.0)
Hemoglobin: 13.9 g/dL (ref 13.0–17.0)
Immature Granulocytes: 1 %
Lymphocytes Relative: 5 %
Lymphs Abs: 0.5 10*3/uL — ABNORMAL LOW (ref 0.7–4.0)
MCH: 30.2 pg (ref 26.0–34.0)
MCHC: 35.4 g/dL (ref 30.0–36.0)
MCV: 85.2 fL (ref 80.0–100.0)
Monocytes Absolute: 0.9 10*3/uL (ref 0.1–1.0)
Monocytes Relative: 9 %
Neutro Abs: 8.5 10*3/uL — ABNORMAL HIGH (ref 1.7–7.7)
Neutrophils Relative %: 83 %
Platelets: 259 10*3/uL (ref 150–400)
RBC: 4.61 MIL/uL (ref 4.22–5.81)
RDW: 13.6 % (ref 11.5–15.5)
WBC: 10.1 10*3/uL (ref 4.0–10.5)
nRBC: 0 % (ref 0.0–0.2)

## 2020-12-04 LAB — ABO/RH: ABO/RH(D): AB POS

## 2020-12-04 LAB — CORTISOL: Cortisol, Plasma: 24.3 ug/dL

## 2020-12-04 LAB — TSH: TSH: 1.19 u[IU]/mL (ref 0.350–4.500)

## 2020-12-04 SURGERY — HEMIARTHROPLASTY, HIP, DIRECT ANTERIOR APPROACH, FOR FRACTURE
Anesthesia: General | Site: Hip | Laterality: Left

## 2020-12-04 SURGERY — HEMIARTHROPLASTY, HIP, DIRECT ANTERIOR APPROACH, FOR FRACTURE
Anesthesia: Choice | Site: Hip | Laterality: Left

## 2020-12-04 MED ORDER — ONDANSETRON HCL 4 MG/2ML IJ SOLN: 4.0000 mg | Freq: Four times a day (QID) | INTRAMUSCULAR | Status: DC | PRN

## 2020-12-04 MED ORDER — METOCLOPRAMIDE HCL 5 MG PO TABS: 5.0000 mg | ORAL_TABLET | Freq: Three times a day (TID) | ORAL | Status: DC | PRN

## 2020-12-04 MED ORDER — 0.9 % SODIUM CHLORIDE (POUR BTL) OPTIME
TOPICAL | Status: DC | PRN
  Administered 2020-12-04: 1000 mL

## 2020-12-04 MED ORDER — CEFAZOLIN SODIUM-DEXTROSE 2-4 GM/100ML-% IV SOLN
2.0000 g | Freq: Once | INTRAVENOUS | Status: AC
  Administered 2020-12-04: 2 g via INTRAVENOUS

## 2020-12-04 MED ORDER — PHENOL 1.4 % MT LIQD: 1.0000 | OROMUCOSAL | Status: DC | PRN

## 2020-12-04 MED ORDER — ACETAMINOPHEN 325 MG PO TABS
650.0000 mg | ORAL_TABLET | Freq: Four times a day (QID) | ORAL | Status: DC
  Administered 2020-12-04 – 2020-12-08 (×11): 650 mg via ORAL
  Filled 2020-12-04 (×12): qty 2

## 2020-12-04 MED ORDER — ENSURE ENLIVE PO LIQD
237.0000 mL | Freq: Two times a day (BID) | ORAL | Status: DC
  Administered 2020-12-05 – 2020-12-07 (×3): 237 mL via ORAL
  Filled 2020-12-04: qty 237

## 2020-12-04 MED ORDER — CHLORHEXIDINE GLUCONATE 0.12 % MT SOLN
OROMUCOSAL | Status: AC
  Administered 2020-12-04: 15 mL via OROMUCOSAL
  Filled 2020-12-04: qty 15

## 2020-12-04 MED ORDER — METOCLOPRAMIDE HCL 5 MG/ML IJ SOLN: 5.0000 mg | Freq: Three times a day (TID) | INTRAMUSCULAR | Status: DC | PRN

## 2020-12-04 MED ORDER — ALBUTEROL SULFATE (2.5 MG/3ML) 0.083% IN NEBU: 2.5000 mg | INHALATION_SOLUTION | RESPIRATORY_TRACT | Status: DC | PRN

## 2020-12-04 MED ORDER — SODIUM CHLORIDE 0.9 % IR SOLN
Status: DC | PRN
  Administered 2020-12-04: 3000 mL

## 2020-12-04 MED ORDER — PHENYLEPHRINE HCL (PRESSORS) 10 MG/ML IV SOLN
INTRAVENOUS | Status: DC | PRN
  Administered 2020-12-04: 120 ug via INTRAVENOUS
  Administered 2020-12-04: 160 ug via INTRAVENOUS

## 2020-12-04 MED ORDER — MORPHINE SULFATE (PF) 2 MG/ML IV SOLN: 0.5000 mg | INTRAVENOUS | Status: DC | PRN

## 2020-12-04 MED ORDER — AMLODIPINE BESYLATE 10 MG PO TABS
10.0000 mg | ORAL_TABLET | Freq: Every day | ORAL | Status: DC
  Administered 2020-12-04 – 2020-12-06 (×3): 10 mg via ORAL
  Filled 2020-12-04 (×3): qty 1

## 2020-12-04 MED ORDER — SUGAMMADEX SODIUM 200 MG/2ML IV SOLN
INTRAVENOUS | Status: DC | PRN
  Administered 2020-12-04: 200 mg via INTRAVENOUS

## 2020-12-04 MED ORDER — METHOCARBAMOL 500 MG PO TABS
500.0000 mg | ORAL_TABLET | Freq: Four times a day (QID) | ORAL | Status: DC | PRN
  Administered 2020-12-04: 500 mg via ORAL
  Filled 2020-12-04: qty 1

## 2020-12-04 MED ORDER — FENTANYL CITRATE (PF) 100 MCG/2ML IJ SOLN: 25.0000 ug | INTRAMUSCULAR | Status: DC | PRN

## 2020-12-04 MED ORDER — METHOCARBAMOL 1000 MG/10ML IJ SOLN
500.0000 mg | Freq: Four times a day (QID) | INTRAVENOUS | Status: DC | PRN
  Filled 2020-12-04 (×3): qty 5

## 2020-12-04 MED ORDER — DOCUSATE SODIUM 100 MG PO CAPS
100.0000 mg | ORAL_CAPSULE | Freq: Two times a day (BID) | ORAL | Status: DC
  Administered 2020-12-04 – 2020-12-07 (×7): 100 mg via ORAL
  Filled 2020-12-04 (×8): qty 1

## 2020-12-04 MED ORDER — ENOXAPARIN SODIUM 40 MG/0.4ML ~~LOC~~ SOLN
40.0000 mg | SUBCUTANEOUS | Status: DC
  Administered 2020-12-05 – 2020-12-08 (×4): 40 mg via SUBCUTANEOUS
  Filled 2020-12-04 (×4): qty 0.4

## 2020-12-04 MED ORDER — PROPOFOL 10 MG/ML IV BOLUS
INTRAVENOUS | Status: AC
  Filled 2020-12-04: qty 20

## 2020-12-04 MED ORDER — ONDANSETRON HCL 4 MG PO TABS: 4.0000 mg | ORAL_TABLET | Freq: Four times a day (QID) | ORAL | Status: DC | PRN

## 2020-12-04 MED ORDER — ONDANSETRON HCL 4 MG/2ML IJ SOLN
INTRAMUSCULAR | Status: DC | PRN
  Administered 2020-12-04: 4 mg via INTRAVENOUS

## 2020-12-04 MED ORDER — ONDANSETRON HCL 4 MG/2ML IJ SOLN: 4.0000 mg | Freq: Once | INTRAMUSCULAR | Status: DC | PRN

## 2020-12-04 MED ORDER — ACETAMINOPHEN 325 MG PO TABS: 650.0000 mg | ORAL_TABLET | Freq: Four times a day (QID) | ORAL | Status: DC | PRN

## 2020-12-04 MED ORDER — ACETAMINOPHEN 10 MG/ML IV SOLN
INTRAVENOUS | Status: DC | PRN
  Administered 2020-12-04: 1000 mg via INTRAVENOUS

## 2020-12-04 MED ORDER — CEFAZOLIN SODIUM-DEXTROSE 2-4 GM/100ML-% IV SOLN
INTRAVENOUS | Status: AC
  Filled 2020-12-04: qty 100

## 2020-12-04 MED ORDER — EPHEDRINE SULFATE 50 MG/ML IJ SOLN
INTRAMUSCULAR | Status: DC | PRN
  Administered 2020-12-04: 15 mg via INTRAVENOUS

## 2020-12-04 MED ORDER — PROPOFOL 10 MG/ML IV BOLUS
INTRAVENOUS | Status: DC | PRN
  Administered 2020-12-04: 130 mg via INTRAVENOUS

## 2020-12-04 MED ORDER — FENTANYL CITRATE (PF) 250 MCG/5ML IJ SOLN
INTRAMUSCULAR | Status: AC
  Filled 2020-12-04: qty 5

## 2020-12-04 MED ORDER — DEXAMETHASONE SODIUM PHOSPHATE 10 MG/ML IJ SOLN
INTRAMUSCULAR | Status: DC | PRN
  Administered 2020-12-04: 10 mg via INTRAVENOUS

## 2020-12-04 MED ORDER — NICOTINE 7 MG/24HR TD PT24
7.0000 mg | MEDICATED_PATCH | Freq: Every day | TRANSDERMAL | Status: DC
  Filled 2020-12-04 (×2): qty 1

## 2020-12-04 MED ORDER — ALBUMIN HUMAN 5 % IV SOLN: INTRAVENOUS | Status: DC | PRN

## 2020-12-04 MED ORDER — MORPHINE SULFATE (PF) 2 MG/ML IV SOLN
1.0000 mg | INTRAVENOUS | Status: DC | PRN
Start: 2020-12-04 — End: 2020-12-04
  Administered 2020-12-04: 1 mg via INTRAVENOUS
  Filled 2020-12-04: qty 1

## 2020-12-04 MED ORDER — PHENYLEPHRINE HCL-NACL 10-0.9 MG/250ML-% IV SOLN
INTRAVENOUS | Status: DC | PRN
  Administered 2020-12-04: 25 ug/min via INTRAVENOUS

## 2020-12-04 MED ORDER — CEFAZOLIN SODIUM-DEXTROSE 2-4 GM/100ML-% IV SOLN
2.0000 g | Freq: Four times a day (QID) | INTRAVENOUS | Status: AC
  Administered 2020-12-04 (×2): 2 g via INTRAVENOUS
  Filled 2020-12-04 (×2): qty 100

## 2020-12-04 MED ORDER — LABETALOL HCL 5 MG/ML IV SOLN: 10.0000 mg | INTRAVENOUS | Status: DC | PRN

## 2020-12-04 MED ORDER — FENTANYL CITRATE (PF) 250 MCG/5ML IJ SOLN
INTRAMUSCULAR | Status: DC | PRN
  Administered 2020-12-04: 50 ug via INTRAVENOUS
  Administered 2020-12-04: 25 ug via INTRAVENOUS
  Administered 2020-12-04: 100 ug via INTRAVENOUS
  Administered 2020-12-04: 25 ug via INTRAVENOUS
  Administered 2020-12-04: 50 ug via INTRAVENOUS

## 2020-12-04 MED ORDER — SALINE SPRAY 0.65 % NA SOLN
1.0000 | NASAL | Status: DC | PRN
  Filled 2020-12-04: qty 44

## 2020-12-04 MED ORDER — IPRATROPIUM-ALBUTEROL 0.5-2.5 (3) MG/3ML IN SOLN
3.0000 mL | Freq: Four times a day (QID) | RESPIRATORY_TRACT | Status: DC
  Filled 2020-12-04: qty 3

## 2020-12-04 MED ORDER — ONDANSETRON HCL 4 MG/2ML IJ SOLN
4.0000 mg | Freq: Four times a day (QID) | INTRAMUSCULAR | Status: DC | PRN
  Administered 2020-12-04: 4 mg via INTRAVENOUS
  Filled 2020-12-04: qty 2

## 2020-12-04 MED ORDER — CHLORHEXIDINE GLUCONATE 0.12 % MT SOLN: 15.0000 mL | Freq: Once | OROMUCOSAL | Status: AC

## 2020-12-04 MED ORDER — LIDOCAINE 2% (20 MG/ML) 5 ML SYRINGE
INTRAMUSCULAR | Status: DC | PRN
  Administered 2020-12-04: 100 mg via INTRAVENOUS

## 2020-12-04 MED ORDER — MENTHOL 3 MG MT LOZG: 1.0000 | LOZENGE | OROMUCOSAL | Status: DC | PRN

## 2020-12-04 MED ORDER — ROCURONIUM BROMIDE 10 MG/ML (PF) SYRINGE
PREFILLED_SYRINGE | INTRAVENOUS | Status: DC | PRN
  Administered 2020-12-04: 60 mg via INTRAVENOUS
  Administered 2020-12-04: 20 mg via INTRAVENOUS

## 2020-12-04 MED ORDER — LACTATED RINGERS IV SOLN: INTRAVENOUS | Status: DC

## 2020-12-04 SURGICAL SUPPLY — 45 items
BIPOLAR PROS AML 55 (Hips) ×2 IMPLANT
BIPOLAR PROS AML 55MM (Hips) ×1 IMPLANT
BLADE SAW SAG 73X25 THK (BLADE) ×2
BLADE SAW SGTL 73X25 THK (BLADE) ×1 IMPLANT
BRUSH SCRUB EZ PLAIN DRY (MISCELLANEOUS) ×6 IMPLANT
COVER SURGICAL LIGHT HANDLE (MISCELLANEOUS) ×3 IMPLANT
DRAPE INCISE IOBAN 85X60 (DRAPES) ×3 IMPLANT
DRAPE ORTHO SPLIT 77X108 STRL (DRAPES) ×6
DRAPE SURG ORHT 6 SPLT 77X108 (DRAPES) ×2 IMPLANT
DRAPE U-SHAPE 47X51 STRL (DRAPES) ×3 IMPLANT
DRSG MEPILEX BORDER 4X8 (GAUZE/BANDAGES/DRESSINGS) ×3 IMPLANT
ELECT BLADE 6.5 EXT (BLADE) ×2 IMPLANT
ELECT REM PT RETURN 9FT ADLT (ELECTROSURGICAL) ×3
ELECTRODE REM PT RTRN 9FT ADLT (ELECTROSURGICAL) ×1 IMPLANT
GLOVE BIO SURGEON STRL SZ7.5 (GLOVE) ×3 IMPLANT
GLOVE BIO SURGEON STRL SZ8 (GLOVE) ×5 IMPLANT
GOWN STRL REUS W/ TWL LRG LVL3 (GOWN DISPOSABLE) ×2 IMPLANT
GOWN STRL REUS W/ TWL XL LVL3 (GOWN DISPOSABLE) ×1 IMPLANT
GOWN STRL REUS W/TWL LRG LVL3 (GOWN DISPOSABLE) ×3
GOWN STRL REUS W/TWL XL LVL3 (GOWN DISPOSABLE) ×6
HANDPIECE INTERPULSE COAX TIP (DISPOSABLE) ×3
HEAD BIPOLAR PROS AML 55 (Hips) IMPLANT
HEAD FEM STD 28X+1.5 STRL (Hips) ×2 IMPLANT
IMMOBILIZER KNEE 20 (SOFTGOODS) ×3
IMMOBILIZER KNEE 20 THIGH 36 (SOFTGOODS) IMPLANT
KIT BASIN OR (CUSTOM PROCEDURE TRAY) ×3 IMPLANT
KIT TURNOVER KIT B (KITS) ×3 IMPLANT
MANIFOLD NEPTUNE II (INSTRUMENTS) ×3 IMPLANT
NS IRRIG 1000ML POUR BTL (IV SOLUTION) ×3 IMPLANT
PACK TOTAL JOINT (CUSTOM PROCEDURE TRAY) ×3 IMPLANT
PAD ARMBOARD 7.5X6 YLW CONV (MISCELLANEOUS) ×6 IMPLANT
RETRIEVER SUT HEWSON (MISCELLANEOUS) ×3 IMPLANT
SET HNDPC FAN SPRY TIP SCT (DISPOSABLE) IMPLANT
STAPLER VISISTAT 35W (STAPLE) ×3 IMPLANT
STEM SUMMIT PRESSFIT HIP SZ7 (Hips) ×2 IMPLANT
SUT ETHILON 2 0 PSLX (SUTURE) ×4 IMPLANT
SUT VIC AB 0 CT1 27 (SUTURE) ×3
SUT VIC AB 0 CT1 27XBRD ANBCTR (SUTURE) IMPLANT
SUT VIC AB 1 CT1 18XCR BRD 8 (SUTURE) ×1 IMPLANT
SUT VIC AB 1 CT1 8-18 (SUTURE) ×3
SUT VIC AB 2-0 CT1 27 (SUTURE) ×6
SUT VIC AB 2-0 CT1 TAPERPNT 27 (SUTURE) ×2 IMPLANT
TOWEL GREEN STERILE (TOWEL DISPOSABLE) ×3 IMPLANT
TOWEL GREEN STERILE FF (TOWEL DISPOSABLE) ×3 IMPLANT
WATER STERILE IRR 1000ML POUR (IV SOLUTION) ×3 IMPLANT

## 2020-12-04 NOTE — Plan of Care (Signed)

## 2020-12-04 NOTE — Anesthesia Preprocedure Evaluation (Addendum)
Anesthesia Evaluation  Patient identified by MRN, date of birth, ID band Patient awake    Reviewed: Allergy & Precautions, NPO status , Patient's Chart, lab work & pertinent test results  Airway Mallampati: II  TM Distance: >3 FB Neck ROM: Full    Dental  (+) Teeth Intact, Dental Advisory Given, Caps   Pulmonary Current Smoker and Patient abstained from smoking.,  Throat cancer    Pulmonary exam normal breath sounds clear to auscultation       Cardiovascular hypertension, Pt. on medications Normal cardiovascular exam Rhythm:Regular Rate:Normal     Neuro/Psych negative neurological ROS  negative psych ROS   GI/Hepatic negative GI ROS, Neg liver ROS,   Endo/Other  negative endocrine ROS  Renal/GU negative Renal ROS     Musculoskeletal negative musculoskeletal ROS (+)   Abdominal   Peds  Hematology HLD   Anesthesia Other Findings LEFT FEMORAL NECK FRACTURE  Reproductive/Obstetrics                            Anesthesia Physical Anesthesia Plan  ASA: III  Anesthesia Plan: General   Post-op Pain Management:    Induction: Intravenous  PONV Risk Score and Plan: 2 and Dexamethasone and Ondansetron  Airway Management Planned: Oral ETT and Video Laryngoscope Planned  Additional Equipment:   Intra-op Plan:   Post-operative Plan: Extubation in OR  Informed Consent: I have reviewed the patients History and Physical, chart, labs and discussed the procedure including the risks, benefits and alternatives for the proposed anesthesia with the patient or authorized representative who has indicated his/her understanding and acceptance.     Dental advisory given  Plan Discussed with: CRNA  Anesthesia Plan Comments:         Anesthesia Quick Evaluation

## 2020-12-04 NOTE — Progress Notes (Signed)
Patient admitted after midnight, please see H&P. 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  -current smoker per patient-- currently on O2-- add breathing treatments and wean O2 after surgery as tolerated -hyponatremia: most likely from HCTZ Plan to go to OR at 9:30 today with Dr. Robert Bellow DO

## 2020-12-04 NOTE — Op Note (Signed)
12/04/2020  PATIENT:  Sergio Chavez  21-Nov-1940  PRE-OPERATIVE DIAGNOSIS:  DISPLACED LEFT FEMORAL NECK FRACTURE  POST-OPERATIVE DIAGNOSIS:  DISPLACED LEFT FEMORAL NECK FRACTURE  PROCEDURE:  Procedure(s): BIPOLAR HEMIARTHROPLASTY OF THE LEFT HIP with [place] Basic ##7 femoral stem, standard neck, 55 mm bipolar head  SURGEON:  Surgeon(s) and Role:    Altamese Challis, MD - Primary  PHYSICIAN ASSISTANT: Ainsley Spinner, PA-C  ANESTHESIA:   general  EBL:  150 mL   IVF: 800 cc crystalloid, 250 cc albumin  BLOOD ADMINISTERED:none  DRAINS: none   LOCAL MEDICATIONS USED:  NONE  SPECIMEN:  No Specimen  DISPOSITION OF SPECIMEN:  N/A  COUNTS:  YES  TOURNIQUET:  * No tourniquets in log *  DICTATION: Note written in EPIC  PLAN OF CARE: Admit to inpatient   PATIENT DISPOSITION:  PACU - hemodynamically stable.   Delay start of Pharmacological VTE agent (>24hrs) due to surgical blood loss or risk of bleeding: no  BRIEF SUMMARY OF INDICATION FOR PROCEDURE:  Sergio Chavez is a very pleasant 80 y.o. community ambulator without assistive aids who sustained fall at home producing inability to bear weight and shortening. I discussed with the patient the risks and benefits, inclding the potential for leg length inequality, dislocation or instability, arthritis, loss of motion, DVT, PE, heart attack, stroke, and death.  Consent was given to proceed.  BRIEF SUMMARY OF PROCEDURE:  The patient was taken to the operating room where general anesthesia was induced and after administration of preoperative antibiotics consisting of 2 g of Ancef.  He was positioned with the left side up and all prominences were padded appropriately.  We made a 10 cm incision after the time-out, carrying dissection down to the IT band, was split in line with the skin.  Cerebellar retractor was placed and we were able to then flex and internally rotate the hip releasing the piriformis and short rotators at their  insertions.  The capsule was then T'd, tagging the corners with #1 Vicryl.  The neck cut was refined using a cutting guide and then this was followed by removal of the head, which sized perfectly to 55 mm. Acetabular trials were placed, confirming this size as the best fit. 56 mm was too large, necessitating a bipolar rather than unipolar head. Mueller and Cobra retractors were placed along the proximal femur, which was then prepared with the canal finder, then lateralizer, followed by reamers up to #7, and the broaches, achieving outstanding fit and fill with the #7 broach.  The calcar reamer was used to refine the cut as we were using a low demand stem.  The canal was irrigated thoroughly and the acetabulum once again searched multiple times for fragments and irrigated thoroughly.  Trial components were placed and the patient had outstanding stability in combined 90 degrees of flexion, adduction, and internal rotation as well as in external rotation and extension.  Consequently, actual components were placed.  My assistant Ainsley Spinner, was necessary for delivery and control of the proximal femur during preparation, also during relocation and dislocation of the trial components as well as relocation of the actual components.  He assisted me with wound closure as well.  I did repair the capsule with #1 Vicryl and then used #2 FiberWire through bone tunnels to repair the short rotators and piriformis.  This was followed by a #1 Vicryl for the IT band and lastly 2-0 Vicryl and nylon for the subcutaneous and skin.  Sterile gently compressive  dressing was applied.  The patient was awakened from anesthesia and transported to the PACU in stable condition.  PROGNOSIS:  The patient will be weightbearing as tolerated with posterior hip precautions.  Patient has an elevated risk of complications specifically DVT/PE given cancer history. He remains on the Medical Service and will be on DVT prophylaxis  mechanically and for six weeks with Eliquis.     Astrid Divine. Marcelino Scot, M.D.

## 2020-12-04 NOTE — Progress Notes (Signed)
Pt transported off the unit to pre-op. Per pre-op, administered Amlodipine before pt left the unit. Pt not in distress and tolerated well.

## 2020-12-04 NOTE — Consult Note (Signed)
Orthopaedic Trauma Service Consultation  Reason for Consult: Displaced left femoral neck fracture Referring Physician: Gean Birchwood, MD  Sergio Chavez is an 80 y.o. male.  HPI: [place] at home with pain and inability to bear weight on the left side despite attempts to do so with a cane. He did not use any assistive devices prior to the fall. Denies other injury. Reports bilateral paresthesias in the feet which are symmetric. Pain, dull aching, sharp with motion, alleviated with stillness and narcotics. No prior injury or intervention.  Past Medical History:  Diagnosis Date  . Hypertension   . 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 BACK SURGERY    . PROSTATE SURGERY      Family History  Problem Relation Age of Onset  . Diabetes Mellitus II Father     Social History:  reports that he has been smoking. He has been smoking about 0.50 packs per day. He has never used smokeless tobacco. He reports that he does not drink alcohol and does not use drugs.  Allergies: No Known Allergies  Medications:  Prior to Admission:  Medications Prior to Admission  Medication Sig Dispense Refill Last Dose  . acetaminophen (TYLENOL) 500 MG tablet Take 1,000 mg by mouth every 6 (six) hours as needed for moderate pain.     Marland Kitchen amLODipine (NORVASC) 10 MG tablet Take 10 mg by mouth daily.     Marland Kitchen lisinopril-hydrochlorothiazide (PRINZIDE,ZESTORETIC) 20-25 MG per tablet Take 1 tablet by mouth daily.     . pravastatin (PRAVACHOL) 40 MG tablet Take 40 mg by mouth daily.       Results for orders placed or performed during the hospital encounter of 12/03/20 (from the past 48 hour(s))  Basic metabolic panel     Status: Abnormal   Collection Time: 12/03/20  7:09 PM  Result Value Ref Range   Sodium 129 (L) 135 - 145 mmol/L   Potassium 4.1 3.5 - 5.1 mmol/L   Chloride 95 (L) 98 - 111 mmol/L   CO2 24 22 - 32 mmol/L   Glucose, Bld 132 (H) 70 - 99 mg/dL    Comment: Glucose  reference range applies only to samples taken after fasting for at least 8 hours.   BUN 19 8 - 23 mg/dL   Creatinine, Ser 1.09 0.61 - 1.24 mg/dL   Calcium 9.9 8.9 - 10.3 mg/dL   GFR, Estimated >60 >60 mL/min    Comment: (NOTE) Calculated using the CKD-EPI Creatinine Equation (2021)    Anion gap 10 5 - 15    Comment: Performed at [facility], [address] [place], [place] [address]  CBC WITH DIFFERENTIAL     Status: Abnormal   Collection Time: 12/03/20  7:09 PM  Result Value Ref Range   WBC 11.6 (H) 4.0 - 10.5 K/uL   RBC 4.93 4.22 - 5.81 MIL/uL   Hemoglobin 14.5 13.0 - 17.0 g/dL   HCT 42.5 39.0 - 52.0 %   MCV 86.2 80.0 - 100.0 fL   MCH 29.4 26.0 - 34.0 pg   MCHC 34.1 30.0 - 36.0 g/dL   RDW 13.5 11.5 - 15.5 %   Platelets 284 150 - 400 K/uL   nRBC 0.0 0.0 - 0.2 %   Neutrophils Relative % 87 %   Neutro Abs 10.0 (H) 1.7 - 7.7 K/uL   Lymphocytes Relative 5 %   Lymphs Abs 0.6 (L) 0.7 - 4.0 K/uL  Monocytes Relative 6 %   Monocytes Absolute 0.7 0.1 - 1.0 K/uL   Eosinophils Relative 1 %   Eosinophils Absolute 0.1 0.0 - 0.5 K/uL   Basophils Relative 1 %   Basophils Absolute 0.1 0.0 - 0.1 K/uL   Immature Granulocytes 0 %   Abs Immature Granulocytes 0.05 0.00 - 0.07 K/uL    Comment: Performed at [facility], [address] [place], [place] [address]  Protime-INR     Status: None   Collection Time: 12/03/20  7:09 PM  Result Value Ref Range   Prothrombin Time 13.0 11.4 - 15.2 seconds   INR 1.0 0.8 - 1.2    Comment: (NOTE) INR goal varies based on device and disease states. Performed at [facility], [address] [place], [place] [address]   Resp Panel by RT-PCR (Flu A&B, Covid) Nasopharyngeal Swab     Status: None   Collection Time: 12/03/20  8:10 PM   Specimen: Nasopharyngeal Swab; Nasopharyngeal(NP) swabs in vial transport medium  Result Value Ref Range   SARS Coronavirus 2 by RT PCR NEGATIVE NEGATIVE    Comment: (NOTE) SARS-CoV-2  target nucleic acids are NOT DETECTED.  The SARS-CoV-2 RNA is generally detectable in upper respiratory specimens during the acute phase of infection. The lowest concentration of SARS-CoV-2 viral copies this assay can detect is 138 copies/mL. A negative result does not preclude SARS-Cov-2 infection and should not be used as the sole basis for treatment or other patient management decisions. A negative result may occur with  improper specimen collection/handling, submission of specimen other than nasopharyngeal swab, presence of viral mutation(s) within the areas targeted by this assay, and inadequate number of viral copies(<138 copies/mL). A negative result must be combined with clinical observations, patient history, and epidemiological information. The expected result is Negative.  Fact Sheet for Patients:  EntrepreneurPulse.com.au  Fact Sheet for Healthcare Providers:  IncredibleEmployment.be  This test is no t yet approved or cleared by the [place] FDA and  has been authorized for detection and/or diagnosis of SARS-CoV-2 by FDA under an Emergency Use Authorization (EUA). This EUA will remain  in effect (meaning this test can be used) for the duration of the COVID-19 declaration under Section 564(b)(1) of the Act, 21 U.S.C.section 360bbb-3(b)(1), unless the authorization is terminated  or revoked sooner.       Influenza A by PCR NEGATIVE NEGATIVE   Influenza B by PCR NEGATIVE NEGATIVE    Comment: (NOTE) The Xpert Xpress SARS-CoV-2/FLU/RSV plus assay is intended as an aid in the diagnosis of influenza from Nasopharyngeal swab specimens and should not be used as a sole basis for treatment. Nasal washings and aspirates are unacceptable for Xpert Xpress SARS-CoV-2/FLU/RSV testing.  Fact Sheet for Patients: EntrepreneurPulse.com.au  Fact Sheet for Healthcare Providers: IncredibleEmployment.be  This  test is not yet approved or cleared by the [place] FDA and has been authorized for detection and/or diagnosis of SARS-CoV-2 by FDA under an Emergency Use Authorization (EUA). This EUA will remain in effect (meaning this test can be used) for the duration of the COVID-19 declaration under Section 564(b)(1) of the Act, 21 U.S.C. section 360bbb-3(b)(1), unless the authorization is terminated or revoked.  Performed at [facility], [address] [place], [place] [address]   Basic metabolic panel     Status: Abnormal   Collection Time: 12/04/20  3:31 AM  Result Value Ref Range   Sodium 131 (L) 135 - 145 mmol/L  Potassium 4.7 3.5 - 5.1 mmol/L   Chloride 95 (L) 98 - 111 mmol/L   CO2 26 22 - 32 mmol/L   Glucose, Bld 123 (H) 70 - 99 mg/dL    Comment: Glucose reference range applies only to samples taken after fasting for at least 8 hours.   BUN 18 8 - 23 mg/dL   Creatinine, Ser 1.11 0.61 - 1.24 mg/dL   Calcium 9.9 8.9 - 10.3 mg/dL   GFR, Estimated >60 >60 mL/min    Comment: (NOTE) Calculated using the CKD-EPI Creatinine Equation (2021)    Anion gap 10 5 - 15    Comment: Performed at [facility] [address], [place], [place] [address]  CBC with Differential/Platelet     Status: Abnormal   Collection Time: 12/04/20  3:31 AM  Result Value Ref Range   WBC 10.1 4.0 - 10.5 K/uL   RBC 4.61 4.22 - 5.81 MIL/uL   Hemoglobin 13.9 13.0 - 17.0 g/dL   HCT 39.3 39.0 - 52.0 %   MCV 85.2 80.0 - 100.0 fL   MCH 30.2 26.0 - 34.0 pg   MCHC 35.4 30.0 - 36.0 g/dL   RDW 13.6 11.5 - 15.5 %   Platelets 259 150 - 400 K/uL   nRBC 0.0 0.0 - 0.2 %   Neutrophils Relative % 83 %   Neutro Abs 8.5 (H) 1.7 - 7.7 K/uL   Lymphocytes Relative 5 %   Lymphs Abs 0.5 (L) 0.7 - 4.0 K/uL   Monocytes Relative 9 %   Monocytes Absolute 0.9 0.1 - 1.0 K/uL   Eosinophils Relative 1 %   Eosinophils Absolute 0.1 0.0 - 0.5 K/uL   Basophils Relative 1 %   Basophils Absolute 0.1 0.0 - 0.1 K/uL    Immature Granulocytes 1 %   Abs Immature Granulocytes 0.05 0.00 - 0.07 K/uL    Comment: Performed at [facility] [address] [place], [place] [address]  TSH     Status: None   Collection Time: 12/04/20  3:31 AM  Result Value Ref Range   TSH 1.190 0.350 - 4.500 uIU/mL    Comment: Performed by a 3rd Generation assay with a functional sensitivity of <=0.01 uIU/mL. Performed at [facility], [place] [address], [place], [place] [address]   Cortisol     Status: None   Collection Time: 12/04/20  3:31 AM  Result Value Ref Range   Cortisol, Plasma 24.3 ug/dL    Comment: (NOTE) AM    6.7 - 22.6 ug/dL PM   <10.0       ug/dL Performed at [facility] [address] [place], [place] [address]   Hepatic function panel     Status: Abnormal   Collection Time: 12/04/20  3:31 AM  Result Value Ref Range   Total Protein 6.2 (L) 6.5 - 8.1 g/dL   Albumin 3.7 3.5 - 5.0 g/dL   AST 20 15 - 41 U/L   ALT 12 0 - 44 U/L   Alkaline Phosphatase 68 38 - 126 U/L   Total Bilirubin 1.0 0.3 - 1.2 mg/dL   Bilirubin, Direct 0.2 0.0 - 0.2 mg/dL   Indirect Bilirubin 0.8 0.3 - 0.9 mg/dL    Comment: Performed at [facility] [address], [place], [place] [address]  Type and screen [place]     Status: None   Collection Time: 12/04/20  3:36 AM  Result Value Ref Range  ABO/RH(D) AB POS    Antibody Screen NEG    Sample Expiration      12/07/2020,2359 Performed at [facility], [place] [address], [place], [place] [address]   ABO/Rh     Status: None   Collection Time: 12/04/20  4:41 AM  Result Value Ref Range   ABO/RH(D)      AB POS Performed at [facility] [address], [place], [place] [address]   Basic metabolic panel     Status: Abnormal   Collection Time: 12/04/20  6:27 AM  Result Value Ref Range   Sodium 130 (L) 135 - 145 mmol/L   Potassium 4.1 3.5 - 5.1 mmol/L   Chloride 95 (L) 98 - 111 mmol/L   CO2 26 22 - 32 mmol/L   Glucose, Bld 111  (H) 70 - 99 mg/dL    Comment: Glucose reference range applies only to samples taken after fasting for at least 8 hours.   BUN 18 8 - 23 mg/dL   Creatinine, Ser 1.06 0.61 - 1.24 mg/dL   Calcium 9.6 8.9 - 10.3 mg/dL   GFR, Estimated >60 >60 mL/min    Comment: (NOTE) Calculated using the CKD-EPI Creatinine Equation (2021)    Anion gap 9 5 - 15    Comment: Performed at [facility] [address], [place], [place] [address]    DG Chest 1 View  Result Date: 12/03/2020 CLINICAL DATA:  Fall today. EXAM: CHEST  1 VIEW COMPARISON:  January 23, 2011. FINDINGS: The heart size and mediastinal contours are within normal limits. Both lungs are clear. No pneumothorax or pleural effusion is noted. Hyperexpansion of the lungs is noted. The visualized skeletal structures are unremarkable. IMPRESSION: No active disease. Aortic Atherosclerosis (ICD10-I70.0). Electronically Signed   By: Marijo Conception M.D.   On: 12/03/2020 19:40   DG Hip Unilat With Pelvis 2-3 Views Left  Result Date: 12/03/2020 CLINICAL DATA:  Left hip pain after fall. EXAM: DG HIP (WITH OR WITHOUT PELVIS) 2-3V LEFT COMPARISON:  None. FINDINGS: Moderately angulated fracture is seen involving the proximal left femoral neck. Right hip is unremarkable. IMPRESSION: Moderately angulated proximal left femoral neck fracture. Electronically Signed   By: Marijo Conception M.D.   On: 12/03/2020 19:38    ROS As above. No recent fever, bleeding abnormalities, urologic dysfunction, or weight gain.  Blood pressure (!) 157/77, pulse 87, temperature 98 F (36.7 C), temperature source Oral, resp. rate 18, height 5\' 7"  (1.702 m), weight 63.5 kg, SpO2 91 %. Physical Exam  NCAT, dysphonia, A&O x 4 LLE No traumatic wounds, ecchymosis, or rash  Tender left hip  No knee or ankle effusion  Knee stable to varus/ valgus and anterior/posterior stress  Sens DPN, SPN, TN intact to touch but reports decreased in sensitivity (baseline and symmetric)  Motor EHL,  ext, flex, evers intact  PT palp, No significant edema   Assessment/Plan: Displaced left femoral neck fracture  I discussed with the patient the risks and benefits of surgery, including the possibility of infection, nerve injury, vessel injury, wound breakdown, arthritis, symptomatic hardware, DVT/ PE, loss of motion, malunion, nonunion, and need for further surgery among others.  We also specifically instability and limb length inequality.  He acknowledged these risks and wished to proceed.  Altamese Keewatin, MD [facility], [facility] [phone]  12/04/2020  8:43 AM

## 2020-12-04 NOTE — Anesthesia Procedure Notes (Signed)
Procedure Name: Intubation Date/Time: 12/04/2020 11:03 AM Performed by: Clearnce Sorrel, CRNA Pre-anesthesia Checklist: Patient identified, Emergency Drugs available, Suction available, Patient being monitored and Timeout performed Patient Re-evaluated:Patient Re-evaluated prior to induction Oxygen Delivery Method: Circle system utilized Preoxygenation: Pre-oxygenation with 100% oxygen Induction Type: IV induction Ventilation: Mask ventilation without difficulty Laryngoscope Size: Glidescope and 4 (anticipated difficult airway) Grade View: Grade I Tube type: Oral Tube size: 7.5 mm Number of attempts: 1 Airway Equipment and Method: Stylet Placement Confirmation: ETT inserted through vocal cords under direct vision,  positive ETCO2 and breath sounds checked- equal and bilateral Secured at: 23 cm Tube secured with: Tape Dental Injury: Teeth and Oropharynx as per pre-operative assessment

## 2020-12-04 NOTE — Progress Notes (Signed)
Initial Nutrition Assessment  DOCUMENTATION CODES:   Not applicable  INTERVENTION:  Once diet advances,  Provide Ensure Enlive po BID, each supplement provides 350 kcal and 20 grams of protein.  NUTRITION DIAGNOSIS:   Increased nutrient needs related to post-op healing as evidenced by estimated needs.  GOAL:   Patient will meet greater than or equal to 90% of their needs  MONITOR:   Supplement acceptance,Skin,Weight trends,Labs,I & O's,Diet advancement  REASON FOR ASSESSMENT:   Consult Assessment of nutrition requirement/status  ASSESSMENT:   80 y.o. male with history of hypertension, hyperlipidemia, laryngeal cancer s/p radiation treatment presents after fall at home. X-rays revealed left hip fracture  Pt unavailable, currently in OR undergoing hemiarthroplasty for left hip fx. RD unable to obtain pt nutrition history at this time. RD to order nutritional supplements to aid in caloric and protein needs as well as in post op healing. RN to provide once diet advances. Unable to complete Nutrition-Focused physical exam at this time.   Labs and medications reviewed.   Diet Order:   Diet Order            Diet NPO time specified  Diet effective now                 EDUCATION NEEDS:   Not appropriate for education at this time  Skin:  Skin Assessment: Reviewed RN Assessment  Last BM:  Unknown  Height:   Ht Readings from Last 1 Encounters:  12/03/20 5\' 7"  (1.702 m)    Weight:   Wt Readings from Last 1 Encounters:  12/03/20 63.5 kg    Ideal Body Weight:  67.27 kg  BMI:  Body mass index is 21.93 kg/m.  Estimated Nutritional Needs:   Kcal:  1750-1900  Protein:  80-90 grams  Fluid:  >/= 1.7 L/day  Corrin Parker, MS, RD, LDN RD pager number/after hours weekend pager number on Amion.

## 2020-12-04 NOTE — ED Notes (Signed)
Called [facility] to transport patient to [facility]

## 2020-12-04 NOTE — Progress Notes (Signed)
Per pt request, personal belongings placed in drawer while pt at procedure. Belongings include: watch, glasses, cell phone.

## 2020-12-04 NOTE — H&P (Signed)
History and Physical    Sergio Chavez:[id number] DOB: Feb 21, 1941 DOA: 12/03/2020  PCP: Saralyn Pilar, MD  Patient coming from: Home.  Chief Complaint: Fall.  HPI: Sergio Chavez is a 80 y.o. male with history of hypertension, hyperlipidemia, tobacco abuse had a fall at home while working at home.  Denies hitting his head or losing consciousness.  Was brought to the ER admits in [facility] due to pain in the left hip area.  ED Course: In the ER x-rays revealed left hip fracture and Dr. Mardelle Matte on-call orthopedic surgery was consulted.  Labs show sodium of around 129 and patient has chronic hyponatremia when compared to labs in [place] in September sodium was around 130.  Mild leukocytosis Covid test negative EKG shows nonspecific changes with chest x-ray showing nothing acute.  Review of Systems: As per HPI, rest all negative.   Past Medical History:  Diagnosis Date   Hypertension    Throat cancer [facility])     Past Surgical History:  Procedure Laterality Date   BACK SURGERY     PROSTATE SURGERY       reports that he has been smoking. He has been smoking about 0.50 packs per day. He has never used smokeless tobacco. He reports that he does not drink alcohol and does not use drugs.  No Known Allergies  Family History  Problem Relation Age of Onset   Diabetes Mellitus II Father     Prior to Admission medications   Medication Sig Start Date End Date Taking? Authorizing Provider  acetaminophen (TYLENOL) 500 MG tablet Take 1,000 mg by mouth every 6 (six) hours as needed for moderate pain.    [provider]  amLODipine (NORVASC) 10 MG tablet Take 10 mg by mouth daily.    [provider]  lisinopril-hydrochlorothiazide (PRINZIDE,ZESTORETIC) 20-25 MG per tablet Take 1 tablet by mouth daily.    [provider]  pravastatin (PRAVACHOL) 40 MG tablet Take 40 mg by mouth daily.    [provider]    Physical  Exam: Constitutional: Moderately built and nourished. Vitals:   12/03/20 2031 12/04/20 0000 12/04/20 0030 12/04/20 0209  BP: 125/70 (!) 141/72 127/75 (!) 158/79  Pulse: 70 66 65 91  Resp: 18 18 18 19   Temp:  97.9 F (36.6 C) 97.9 F (36.6 C) 97.9 F (36.6 C)  TempSrc:  Oral Oral Oral  SpO2: 94% 98% 98% (!) 89%  Weight:      Height:       Eyes: Anicteric no pallor. ENMT: No discharge from the ears eyes nose and mouth. Neck: No mass felt.  No neck rigidity. Respiratory: No rhonchi or crepitations. Cardiovascular: S1-S2 heard. Abdomen: Soft nontender bowel sounds present. Musculoskeletal: No edema.  Pain on moving left hip. Skin: No rash. Neurologic: Alert awake oriented to time place and person.  Moves all extremities. Psychiatric: Appears normal.  Normal affect.   Labs on Admission: I have personally reviewed following labs and imaging studies  CBC: Recent Labs  Lab 12/03/20 1909 12/04/20 0331  WBC 11.6* 10.1  NEUTROABS 10.0* 8.5*  HGB 14.5 13.9  HCT 42.5 39.3  MCV 86.2 85.2  PLT 284 790   Basic Metabolic Panel: Recent Labs  Lab 12/03/20 1909 12/04/20 0331  NA 129* 131*  K 4.1 4.7  CL 95* 95*  CO2 24 26  GLUCOSE 132* 123*  BUN 19 18  CREATININE 1.09 1.11  CALCIUM 9.9 9.9   GFR: Estimated Creatinine Clearance: 47.7  mL/min (by C-G formula based on SCr of 1.11 mg/dL). Liver Function Tests: Recent Labs  Lab 12/04/20 0331  AST 20  ALT 12  ALKPHOS 68  BILITOT 1.0  PROT 6.2*  ALBUMIN 3.7   No results for input(s): LIPASE, AMYLASE in the last 168 hours. No results for input(s): AMMONIA in the last 168 hours. Coagulation Profile: Recent Labs  Lab 12/03/20 1909  INR 1.0   Cardiac Enzymes: No results for input(s): CKTOTAL, CKMB, CKMBINDEX, TROPONINI in the last 168 hours. BNP (last 3 results) No results for input(s): PROBNP in the last 8760 hours. HbA1C: No results for input(s): HGBA1C in the last 72 hours. CBG: No results for input(s): GLUCAP  in the last 168 hours. Lipid Profile: No results for input(s): CHOL, HDL, LDLCALC, TRIG, CHOLHDL, LDLDIRECT in the last 72 hours. Thyroid Function Tests: Recent Labs    12/04/20 0331  TSH 1.190   Anemia Panel: No results for input(s): VITAMINB12, FOLATE, FERRITIN, TIBC, IRON, RETICCTPCT in the last 72 hours. Urine analysis: No results found for: COLORURINE, APPEARANCEUR, LABSPEC, PHURINE, GLUCOSEU, HGBUR, BILIRUBINUR, KETONESUR, PROTEINUR, UROBILINOGEN, NITRITE, LEUKOCYTESUR Sepsis Labs: @LABRCNTIP (procalcitonin:4,lacticidven:4) ) Recent Results (from the past 240 hour(s))  Resp Panel by RT-PCR (Flu A&B, Covid) Nasopharyngeal Swab     Status: None   Collection Time: 12/03/20  8:10 PM   Specimen: Nasopharyngeal Swab; Nasopharyngeal(NP) swabs in vial transport medium  Result Value Ref Range Status   SARS Coronavirus 2 by RT PCR NEGATIVE NEGATIVE Final    Comment: (NOTE) SARS-CoV-2 target nucleic acids are NOT DETECTED.  The SARS-CoV-2 RNA is generally detectable in upper respiratory specimens during the acute phase of infection. The lowest concentration of SARS-CoV-2 viral copies this assay can detect is 138 copies/mL. A negative result does not preclude SARS-Cov-2 infection and should not be used as the sole basis for treatment or other patient management decisions. A negative result may occur with  improper specimen collection/handling, submission of specimen other than nasopharyngeal swab, presence of viral mutation(s) within the areas targeted by this assay, and inadequate number of viral copies(<138 copies/mL). A negative result must be combined with clinical observations, patient history, and epidemiological information. The expected result is Negative.  Fact Sheet for Patients:  EntrepreneurPulse.com.au  Fact Sheet for Healthcare Providers:  IncredibleEmployment.be  This test is no t yet approved or cleared by the [place] FDA  and  has been authorized for detection and/or diagnosis of SARS-CoV-2 by FDA under an Emergency Use Authorization (EUA). This EUA will remain  in effect (meaning this test can be used) for the duration of the COVID-19 declaration under Section 564(b)(1) of the Act, 21 U.S.C.section 360bbb-3(b)(1), unless the authorization is terminated  or revoked sooner.       Influenza A by PCR NEGATIVE NEGATIVE Final   Influenza B by PCR NEGATIVE NEGATIVE Final    Comment: (NOTE) The Xpert Xpress SARS-CoV-2/FLU/RSV plus assay is intended as an aid in the diagnosis of influenza from Nasopharyngeal swab specimens and should not be used as a sole basis for treatment. Nasal washings and aspirates are unacceptable for Xpert Xpress SARS-CoV-2/FLU/RSV testing.  Fact Sheet for Patients: EntrepreneurPulse.com.au  Fact Sheet for Healthcare Providers: IncredibleEmployment.be  This test is not yet approved or cleared by the [place] FDA and has been authorized for detection and/or diagnosis of SARS-CoV-2 by FDA under an Emergency Use Authorization (EUA). This EUA will remain in effect (meaning this test can be used) for the duration of the COVID-19 declaration under Section 564(b)(1)  of the Act, 21 U.S.C. section 360bbb-3(b)(1), unless the authorization is terminated or revoked.  Performed at [facility], [address] [place], [place] [address]      Radiological Exams on Admission: DG Chest 1 View  Result Date: 12/03/2020 CLINICAL DATA:  Fall today. EXAM: CHEST  1 VIEW COMPARISON:  January 23, 2011. FINDINGS: The heart size and mediastinal contours are within normal limits. Both lungs are clear. No pneumothorax or pleural effusion is noted. Hyperexpansion of the lungs is noted. The visualized skeletal structures are unremarkable. IMPRESSION: No active disease. Aortic Atherosclerosis (ICD10-I70.0). Electronically Signed   By: Marijo Conception M.D.    On: 12/03/2020 19:40   DG Hip Unilat With Pelvis 2-3 Views Left  Result Date: 12/03/2020 CLINICAL DATA:  Left hip pain after fall. EXAM: DG HIP (WITH OR WITHOUT PELVIS) 2-3V LEFT COMPARISON:  None. FINDINGS: Moderately angulated fracture is seen involving the proximal left femoral neck. Right hip is unremarkable. IMPRESSION: Moderately angulated proximal left femoral neck fracture. Electronically Signed   By: Marijo Conception M.D.   On: 12/03/2020 19:38    EKG: Independently reviewed.  Normal sinus rhythm with nonspecific ST changes.  Assessment/Plan Principal Problem:   Closed left hip fracture (HCC) Active Problems:   Hyponatremia   Essential hypertension   Hip fracture (HCC)    1. Left hip fracture status post mechanical fall.  Dr. Mardelle Matte on-call orthopedic surgery has been consulted.  We will keep patient n.p.o. in anticipation of procedure.  Pain medication and muscle relaxants. 2. Hyponatremia appears to be chronic.  In September 2021 patient sodium was around 130.  Patient eventually needs to be discontinued on hydrochlorothiazide which patient takes at home.  Follow metabolic panel closely. 3. Hypertension -since patient is n.p.o. we will keep patient on as needed IV labetalol and resume home medication except hydrochlorothiazide when patient can take orally. 4. Hyperlipidemia on statins. 5. Tobacco abuse advised about quitting patient is not wheezing at this time.  Chest x-ray unremarkable.  Since patient has hip fracture will need close monitoring and inpatient status.   DVT prophylaxis: SCDs.  Avoiding anticoagulation in anticipation of surgery. Code Status: Full code. Family Communication: Discussed with patient. Disposition Plan: May need rehab. Consults called: Orthopedic surgery. Admission status: Inpatient.   Rise Patience MD [facility] Pager [phone].  If 7PM-7AM, please contact night-coverage [email or web address] Password [facility]  12/04/2020, 4:36 AM

## 2020-12-04 NOTE — Anesthesia Postprocedure Evaluation (Signed)
Anesthesia Post Note  Patient: Sergio Chavez  Procedure(s) Performed: HIP HEMIARTHROPLASTY (Left Hip)     Patient location during evaluation: PACU Anesthesia Type: General Level of consciousness: awake and alert Pain management: pain level controlled Vital Signs Assessment: post-procedure vital signs reviewed and stable Respiratory status: spontaneous breathing, nonlabored ventilation, respiratory function stable and patient connected to nasal cannula oxygen Cardiovascular status: blood pressure returned to baseline and stable Postop Assessment: no apparent nausea or vomiting Anesthetic complications: no   No complications documented.  Last Vitals:  Vitals:   12/04/20 1417 12/04/20 1440  BP: 132/66 121/70  Pulse: 72 68  Resp: 20 20  Temp: (!) 36.2 C 36.7 C  SpO2: 91% 92%    Last Pain:  Vitals:   12/04/20 1417  TempSrc:   PainSc: Asleep   Pain Goal:    LLE Motor Response: Purposeful movement,Responds to commands (12/04/20 1417) LLE Sensation: Full sensation (12/04/20 1417)            Catalina Gravel

## 2020-12-04 NOTE — Transfer of Care (Signed)
Immediate Anesthesia Transfer of Care Note  Patient: Sergio Chavez  Procedure(s) Performed: HIP HEMIARTHROPLASTY (Left Hip)  Patient Location: PACU  Anesthesia Type:General  Level of Consciousness: drowsy  Airway & Oxygen Therapy: Patient Spontanous Breathing and Patient connected to face mask oxygen  Post-op Assessment: Report given to RN and Post -op Vital signs reviewed and stable  Post vital signs: Reviewed and stable  Last Vitals:  Vitals Value Taken Time  BP 115/60 12/04/20 1317  Temp 36.5 C 12/04/20 1317  Pulse 72 12/04/20 1319  Resp 13 12/04/20 1319  SpO2 100 % 12/04/20 1319  Vitals shown include unvalidated device data.  Last Pain:  Vitals:   12/04/20 0900  TempSrc: Oral  PainSc: 4          Complications: No complications documented.

## 2020-12-05 DIAGNOSIS — S72002A Fracture of unspecified part of neck of left femur, initial encounter for closed fracture: Secondary | ICD-10-CM | POA: Diagnosis not present

## 2020-12-05 DIAGNOSIS — I10 Essential (primary) hypertension: Secondary | ICD-10-CM | POA: Diagnosis not present

## 2020-12-05 DIAGNOSIS — E871 Hypo-osmolality and hyponatremia: Secondary | ICD-10-CM | POA: Diagnosis not present

## 2020-12-05 LAB — CBC
HCT: 34.6 % — ABNORMAL LOW (ref 39.0–52.0)
Hemoglobin: 12.2 g/dL — ABNORMAL LOW (ref 13.0–17.0)
MCH: 30.3 pg (ref 26.0–34.0)
MCHC: 35.3 g/dL (ref 30.0–36.0)
MCV: 85.9 fL (ref 80.0–100.0)
Platelets: 210 10*3/uL (ref 150–400)
RBC: 4.03 MIL/uL — ABNORMAL LOW (ref 4.22–5.81)
RDW: 13.5 % (ref 11.5–15.5)
WBC: 9.2 10*3/uL (ref 4.0–10.5)
nRBC: 0 % (ref 0.0–0.2)

## 2020-12-05 LAB — BASIC METABOLIC PANEL
Anion gap: 12 (ref 5–15)
BUN: 22 mg/dL (ref 8–23)
CO2: 23 mmol/L (ref 22–32)
Calcium: 9.3 mg/dL (ref 8.9–10.3)
Chloride: 94 mmol/L — ABNORMAL LOW (ref 98–111)
Creatinine, Ser: 1.05 mg/dL (ref 0.61–1.24)
GFR, Estimated: >60 mL/min (ref >60)
Glucose, Bld: 118 mg/dL — ABNORMAL HIGH (ref 70–99)
Potassium: 4.1 mmol/L (ref 3.5–5.1)
Sodium: 129 mmol/L — ABNORMAL LOW (ref 135–145)

## 2020-12-05 LAB — SODIUM, URINE, RANDOM: Sodium, Ur: 31 mmol/L

## 2020-12-05 MED ORDER — ALUM & MAG HYDROXIDE-SIMETH 200-200-20 MG/5ML PO SUSP
30.0000 mL | ORAL | Status: DC | PRN
  Filled 2020-12-05: qty 30

## 2020-12-05 MED ORDER — LISINOPRIL 20 MG PO TABS
20.0000 mg | ORAL_TABLET | Freq: Every day | ORAL | Status: DC
  Administered 2020-12-05 – 2020-12-08 (×4): 20 mg via ORAL
  Filled 2020-12-05 (×4): qty 1

## 2020-12-05 NOTE — Progress Notes (Signed)
   ORTHOPAEDIC PROGRESS NOTE  s/p Procedure(s): Left HIP HEMIARTHROPLASTY  SUBJECTIVE: Reports mild pain about operative site. No chest pain. No SOB. No nausea/vomiting. No other complaints.  OBJECTIVE: PE: Patient sitting in chair   Ambulated short distance with physical therapy  Did have episode of lightheadedness at end of ambulation  Vitals:   12/05/20 0512 12/05/20 0905  BP: 133/74 (!) 142/106  Pulse: 87 79  Resp: 16 18  Temp: 98.5 F (36.9 C) 98 F (36.7 C)  SpO2: 97% 95%     ASSESSMENT: Sergio Chavez is a 80 y.o. male doing well postoperatively.  PLAN: Weightbearing: WBAT LLE Insicional and dressing care: Dressings left intact until follow-up Orthopedic device(s): None Showering: not yet VTE prophylaxis: Lovenox 40mg  qd 35 days Pain control: controlled Follow - up plan: short term skilled nursing placement for rehab vs home with wife and home health Contact information:   Mal[place] Ambrocio A. Kaleen Mask Physician Assistant [facility] [phone]  12/05/2020, 11:13 AM  Patient ID: Sergio Chavez, male   DOB: [birth year], 80 y.o.   MRN: [id number]

## 2020-12-05 NOTE — TOC CAGE-AID Note (Signed)
Transition of Care [facility]) - CAGE-AID Screening   Patient Details  Name: Sergio Chavez MRN: [id number] Date of Birth: 1941/10/09   Clinical Narrative:  Patient denies any current alcohol or drug use.  CAGE-AID Screening:    Have You Ever Felt You Ought to Cut Down on Your Drinking or Drug Use?: No Have People Annoyed You By Critizing Your Drinking Or Drug Use?: No Have You Felt Bad Or Guilty About Your Drinking Or Drug Use?: No Have You Ever Had a Drink or Used Drugs First Thing In The Morning to Steady Your Nerves or to Get Rid of a Hangover?: No CAGE-AID Score: 0  Substance Abuse Education Offered: No

## 2020-12-05 NOTE — Progress Notes (Signed)
Occupational Therapy Evaluation Patient Details Name: Sergio Chavez MRN: [id number] DOB: 02-16-1941 Today's Date: 12/05/2020    History of Present Illness Sergio Chavez is a 80 y.o. male with history of hypertension, hyperlipidemia, tobacco abuse had a fall at home while working at home; sustained a L hip fracture, now s/p L hip hemiarthroplasty for fixation, Post Hip Prec, WBAT   Clinical Impression   PTA, pt was living with his wife in a 1-level house with 3-4 STE. Pt was independent with ADLs/ADL mobility/IADLs without using a mobility device. Pt denies use of SPC although he owns one. Today, pt received semi-reclined in bed, pt's son-in-law present at bedside, pt agreeable to OT eval. Pt presents with intact strength to BUEs and RLE. Noted weakness to LLE 2/2 recent hip surgery (WBAT LLE and posterior hip precautions). OT reviewed posterior hip precautions and potential use of AE/DME for LB self-care, pt/family receptive (family ordered hip kit already). Currently, pt requires min guard for bed mobility, min guard for sit>stand from EOB using RW, min guard for marching in place using RW, min-mod assist for LB self-care, and setup-Mod I for UB self-care. Pt deferred further functional mobility in room 2/2 fatigue (up with PT earlier today and sat in chair for a few hours). Educated pt/family on AE/DME, d/c planning, PLB, activity pacing, posterior hip precautions, and RW safety management. Will continue to follow pt in-house as able. Pt would benefit from continued skilled acute care OT services to maximize independence with ADLs/ADL mobility.     Follow Up Recommendations  Home health OT;Supervision/Assistance - 24 hour;Other (comment) (may benefit from short stay rehab if pt demonstrates slow progression/safety concerns however pt prefers home if possible)    Equipment Recommendations  Other (comment) (will continue to assess)    Recommendations for Other Services Other (comment)  (None)     Precautions / Restrictions Precautions Precautions: Posterior Hip;Fall Precaution Booklet Issued: Yes (comment) Precaution Comments: Very dizzy walking to window and had to sit -- consider orthostatics Restrictions LLE Weight Bearing: Weight bearing as tolerated Other Position/Activity Restrictions: LLE WBAT      Mobility Bed Mobility Overal bed mobility: Needs Assistance Bed Mobility: Supine to Sit     Supine to sit: Min guard     General bed mobility comments: min guard for technique to adhere to posterior hip precautions, using bed railing    Transfers Overall transfer level: Needs assistance Equipment used: Rolling walker (2 wheeled) Transfers: Sit to/from Stand Sit to Stand: Min guard         General transfer comment: Cues for hand placement and safety, as well as LLE placement to adhere to post hip prec; good control of descent to sit; marched in place with RW for support and min guard    Balance Overall balance assessment: Needs assistance Sitting-balance support: Feet supported Sitting balance-Leahy Scale: Good     Standing balance support: Bilateral upper extremity supported;During functional activity Standing balance-Leahy Scale: Fair        ADL either performed or assessed with clinical judgement   ADL Overall ADL's : Needs assistance/impaired Eating/Feeding: Independent   Grooming: Wash/dry face;Set up;Sitting Grooming Details (indicate cue type and reason): sitting EOB Upper Body Bathing: Modified independent;Sitting   Lower Body Bathing: Minimal assistance;Moderate assistance;Sitting/lateral leans;Sit to/from stand Lower Body Bathing Details (indicate cue type and reason): simulated; educated on LB AE/DME Upper Body Dressing : Modified independent;Sitting   Lower Body Dressing: Moderate assistance;Minimal assistance;Sitting/lateral leans;Sit to/from stand Lower Body Dressing Details (indicate  cue type and reason): ordered hip  kit Toilet Transfer: Min guard;Adhering to hip precautions;Cueing for safety;Ambulation;RW Toilet Transfer Details (indicate cue type and reason): min guard for sit>stand from EOB, marched in place with min guard 2/2 fatigue (using RW) Toileting- Clothing Manipulation and Hygiene: Minimal assistance;Moderate assistance;Cueing for safety;Sitting/lateral lean;Sit to/from stand Toileting - Clothing Manipulation Details (indicate cue type and reason): simulated Tub/ Banker:  (not assessed)   Functional mobility during ADLs: Min guard;Cueing for safety;Rolling walker General ADL Comments: stood at EOB and marched in place with RW; deferred further mobility 2/2 fatigue; pt up in chair and ambulating around room with PT earlier     Vision Baseline Vision/History: Wears glasses Wears Glasses: At all times Patient Visual Report: No change from baseline Vision Assessment?: No apparent visual deficits     Perception Perception Perception Tested?: No   Praxis Praxis Praxis tested?: Not tested    Pertinent Vitals/Pain Pain Assessment: 0-10 Pain Score: 7  Pain Location: L hip Pain Descriptors / Indicators: Aching Pain Intervention(s): Monitored during session;Repositioned;Patient requesting pain meds-RN notified     Hand Dominance Right   Extremity/Trunk Assessment Upper Extremity Assessment Upper Extremity Assessment: Overall WFL for tasks assessed   Lower Extremity Assessment Lower Extremity Assessment: Overall WFL for tasks assessed LLE Deficits / Details: Overall good muscle activation about hip joint and adequate ROM for transfers and amb   Cervical / Trunk Assessment Cervical / Trunk Assessment: Normal   Communication Communication Communication: Other (comment);HOH (hard of hearing)   Cognition Arousal/Alertness: Awake/alert Behavior During Therapy: WFL for tasks assessed/performed Overall Cognitive Status: Within Functional Limits for tasks assessed Area of  Impairment: Safety/judgement;Awareness     Safety/Judgement: Decreased awareness of safety;Decreased awareness of deficits Awareness: Intellectual   General Comments: Unable to repeat Post Hip Precautions at end of session, needs cues for understanding and applying   General Comments  dressings intact to L hip, slight edema noted      Home Living Family/patient expects to be discharged to:: Private residence Living Arrangements: Spouse/significant other Available Help at Discharge: Family;Other (Comment) Type of Home: House Home Access: Stairs to enter [employer] of Steps: 3-4 Entrance Stairs-Rails: None Home Layout: One level     Bathroom Shower/Tub: Teacher, early years/pre: Standard Bathroom Accessibility: Yes How Accessible: Accessible via walker Home Equipment: Cane - single point;Crutches;Grab bars - tub/shower          Prior Functioning/Environment Level of Independence: Independent        Comments:  (wasn't using cane, independent at baseline, driving)        OT Problem List: Decreased strength;Decreased range of motion;Decreased activity tolerance;Impaired balance (sitting and/or standing);Decreased safety awareness;Decreased knowledge of precautions;Pain;Increased edema      OT Treatment/Interventions: Self-care/ADL training;Therapeutic exercise;Neuromuscular education;DME and/or AE instruction;Therapeutic activities;Patient/family education    OT Goals(Current goals can be found in the care plan section) Acute Rehab OT Goals Patient Stated Goal: Wants to be home OT Goal Formulation: With patient/family Time For Goal Achievement: 12/19/20 Potential to Achieve Goals: Good  OT Frequency: Min 2X/week    AM-PAC OT "6 Clicks" Daily Activity     Outcome Measure Help from another person eating meals?: None Help from another person taking care of personal grooming?: A Little Help from another person toileting, which includes using  toliet, bedpan, or urinal?: A Little Help from another person bathing (including washing, rinsing, drying)?: A Lot Help from another person to put on and taking off regular upper body clothing?: None  Help from another person to put on and taking off regular lower body clothing?: A Lot 6 Click Score: 18   End of Session Equipment Utilized During Treatment: Gait belt;Rolling walker Nurse Communication: Mobility status;Precautions;Weight bearing status  Activity Tolerance: Patient tolerated treatment well;Patient limited by fatigue Patient left: in bed;with call bell/phone within reach;with family/visitor present  OT Visit Diagnosis: Unsteadiness on feet (R26.81);Muscle weakness (generalized) (M62.81);Pain Pain - Right/Left: Left Pain - part of body: Leg                Time: 1753-0104 OT Time Calculation (min): 26 min Charges:  OT General Charges $OT Visit: 1 Visit OT Evaluation $OT Eval Moderate Complexity: 1 Mod OT Treatments $Therapeutic Activity: 8-22 mins  Michel Bickers, OTR/L [facility] [phone]   Francesca Jewett 12/05/2020, 6:11 PM

## 2020-12-05 NOTE — Progress Notes (Signed)
Physical Therapy Note  SATURATION QUALIFICATIONS: (This note is used to comply with regulatory documentation for home oxygen)  Patient Saturations on Room Air at Rest = 90%  Patient Saturations on Room Air while Ambulating = 86%  Patient Saturations on 2 Liters of oxygen while Ambulating = 95%  Please briefly explain why patient needs home oxygen: Patient requires supplemental oxygen to maintain oxygen saturations at acceptable, safe levels with physical activity.   Sergio Chavez, [place]  Acute Rehabilitation Services Pager [phone] Office [phone]

## 2020-12-05 NOTE — Progress Notes (Signed)
SATURATION QUALIFICATIONS: (This note is used to comply with regulatory documentation for home oxygen)  Patient Saturations on Room Air at Rest = 92%  Patient Saturations on Room Air while Ambulating = 87%  Patient Saturations on 2Liters of oxygen while Ambulating = 92%  Please briefly explain why patient needs home oxygen:  

## 2020-12-05 NOTE — Evaluation (Signed)
Clinical/Bedside Swallow Evaluation Patient Details  Name: Sergio Chavez MRN: [id number] Date of Birth: [birth year]  Today's Date: 12/05/2020 Time: SLP Start Time (ACUTE ONLY): 1135 SLP Stop Time (ACUTE ONLY): 1150 SLP Time Calculation (min) (ACUTE ONLY): 15 min  Past Medical History:  Past Medical History:  Diagnosis Date  . Hypertension   . Throat cancer [facility])    Past Surgical History:  Past Surgical History:  Procedure Laterality Date  . BACK SURGERY    . PROSTATE SURGERY     HPI:  Patient is an 80 y.o. 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 He denied hitting head or LOC. He was brought to [facility] ER for evaluation of pain in left hip area. ER xray revealed left  hip fracture. Covid test negative, CXR did not show anything acute, EKG showed nonspecific changes. He had left hip hemiarthroplasty at [facility] on 2/26.   Assessment / Plan / Recommendation Clinical Impression  Patient presents with an oropharyngeal swallow that appears [facility] and without coughing, throat clearing or other overt s/s that would be concerning for potential aspiration or penetration of liquids/solids. Patient reported that he had just been eating a sandwich and he did have some hiccups when doing so. He denied having h/o GERD and denied frequent hiccuping during meals premorbidly. 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 He stated that his throat seemed slightly more irritated but denied having significant soreness, denied pain in throat. SLP recommended to patient that he notify RN and/or MD if he continues to have hiccuping during PO intake. SLP Visit Diagnosis: Dysphagia, unspecified (R13.10)     Aspiration Risk  No limitations    Diet Recommendation Regular;Thin liquid   Liquid Administration via: Cup;Straw Medication Administration: Whole meds with liquid Supervision: Patient able to self feed Compensations: Small sips/bites;Slow rate;Minimize environmental distractions Postural Changes: Seated upright at 90 degrees;Remain upright for at least 30 minutes after po intake    Other  Recommendations Oral Care Recommendations: Oral care BID   Follow up Recommendations None      Frequency and Duration   N/A         Prognosis   N/A     Swallow Study   General Date of Onset: 12/03/20 HPI: Patient is an 80 y.o. 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 He denied hitting head or LOC. He was brought to [facility] ER for evaluation of pain in left hip area. ER xray revealed left  hip fracture. Covid test negative, CXR did not show anything acute, EKG showed nonspecific changes. He had left hip hemiarthroplasty at [facility] on 2/26. Type of Study: Bedside Swallow Evaluation Previous Swallow Assessment: None found Diet Prior to this Study: Regular;Thin liquids Temperature Spikes Noted: No Respiratory Status: Room air    Oral/Motor/Sensory Function Overall Oral Motor/Sensory Function: Within functional limits   Ice Chips     Thin Liquid Thin Liquid: Within functional limits Presentation: Self Fed;Cup    Nectar Thick     Honey Thick     Puree Puree: Not  tested   Solid     Solid: Not tested Other Comments: Patient declined but said that he had "hiccups" after eating pimento cheese sandwich     Sonia Baller, MA, CCC-SLP Speech Therapy

## 2020-12-05 NOTE — Progress Notes (Signed)
Progress Note    Sergio Chavez  GMW:[id number] DOB: 1941/01/08  DOA: 12/03/2020 PCP: Saralyn Pilar, MD    Brief Narrative:    Medical records reviewed and are as summarized below:  Sergio Chavez is an 80 y.o. male  with history of hypertension, hyperlipidemia, tobacco abuse had a fall at home while working at home.  Denies hitting his head or losing consciousness.  Was brought to the ER admits in [facility] due to pain in the left hip area.  Assessment/Plan:   Principal Problem:   Closed left hip fracture [facility]) Active Problems:   Hyponatremia   Essential hypertension   Hip fracture (HCC)   Left hip fracture status post mechanical fall.   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  In September 2021 patient sodium was around 130.   -d/c HCTZ -check urine na  Hypertension  -stop HCTZ -resume home meds  Hyperlipidemia  -statins.  Tobacco abuse -encouraged cessation    Family Communication/Anticipated D/C date and plan/Code Status   DVT prophylaxis: Lovenox ordered. Code Status: Full Code.  Family Communication: son-in law Disposition Plan: Status is: Inpatient  Remains inpatient appropriate because:Inpatient level of care appropriate due to severity of illness   Dispo: The patient is from: Home              Anticipated d/c is to: tbd              Patient currently is not medically stable to d/c.   Difficult to place patient No         Medical Consultants:    ortho  Subjective:   Not hungry  Objective:    Vitals:   12/04/20 2217 12/05/20 0300 12/05/20 0512 12/05/20 0905  BP: 121/83 (!) 145/92 133/74 (!) 142/106  Pulse: 83 85 87 79  Resp: 16 17 16 18   Temp: 98.2 F (36.8 C) 98.4 F (36.9 C) 98.5 F (36.9 C) 98 F (36.7 C)  TempSrc: Oral Oral Oral Oral  SpO2: 96% 95% 97% 95%  Weight:      Height:        Intake/Output Summary (Last 24  hours) at 12/05/2020 4403 Last data filed at 12/05/2020 0100 Gross per 24 hour  Intake 1070 ml  Output 750 ml  Net 320 ml   Filed Weights   12/03/20 1810  Weight: 63.5 kg    Exam:  General: Appearance:    elderly male in no acute distress     Lungs:      respirations unlabored, on 1L [place]  Heart:    Normal heart rate. Normal rhythm. No murmurs, rubs, or gallops.   MS:   All extremities are intact.   Neurologic:   Awake, alert, oriented x 3.     Data Reviewed:   I have personally reviewed following labs and imaging studies:  Labs: Labs show the following:   Basic Metabolic Panel: Recent Labs  Lab 12/03/20 1909 12/04/20 0331 12/04/20 0627 12/05/20 0118  NA 129* 131* 130* 129*  K 4.1 4.7 4.1 4.1  CL 95* 95* 95* 94*  CO2 24 26 26 23   GLUCOSE 132* 123* 111* 118*  BUN 19 18 18 22   CREATININE 1.09 1.11 1.06 1.05  CALCIUM 9.9 9.9 9.6 9.3   GFR Estimated Creatinine Clearance: 50.4 mL/min (by C-G formula based on SCr of 1.05 mg/dL). Liver Function Tests: Recent Labs  Lab 12/04/20 [phone]  AST 20  ALT 12  ALKPHOS 68  BILITOT 1.0  PROT 6.2*  ALBUMIN 3.7   No results for input(s): LIPASE, AMYLASE in the last 168 hours. No results for input(s): AMMONIA in the last 168 hours. Coagulation profile Recent Labs  Lab 12/03/20 1909  INR 1.0    CBC: Recent Labs  Lab 12/03/20 1909 12/04/20 0331 12/05/20 0118  WBC 11.6* 10.1 9.2  NEUTROABS 10.0* 8.5*  --   HGB 14.5 13.9 12.2*  HCT 42.5 39.3 34.6*  MCV 86.2 85.2 85.9  PLT 284 259 210   Cardiac Enzymes: No results for input(s): CKTOTAL, CKMB, CKMBINDEX, TROPONINI in the last 168 hours. BNP (last 3 results) No results for input(s): PROBNP in the last 8760 hours. CBG: No results for input(s): GLUCAP in the last 168 hours. D-Dimer: No results for input(s): DDIMER in the last 72 hours. Hgb A1c: No results for input(s): HGBA1C in the last 72 hours. Lipid Profile: No results for input(s): CHOL, HDL, LDLCALC, TRIG,  CHOLHDL, LDLDIRECT in the last 72 hours. Thyroid function studies: Recent Labs    12/04/20 0331  TSH 1.190   Anemia work up: No results for input(s): VITAMINB12, FOLATE, FERRITIN, TIBC, IRON, RETICCTPCT in the last 72 hours. Sepsis Labs: Recent Labs  Lab 12/03/20 1909 12/04/20 0331 12/05/20 0118  WBC 11.6* 10.1 9.2    Microbiology Recent Results (from the past 240 hour(s))  Resp Panel by RT-PCR (Flu A&B, Covid) Nasopharyngeal Swab     Status: None   Collection Time: 12/03/20  8:10 PM   Specimen: Nasopharyngeal Swab; Nasopharyngeal(NP) swabs in vial transport medium  Result Value Ref Range Status   SARS Coronavirus 2 by RT PCR NEGATIVE NEGATIVE Final    Comment: (NOTE) SARS-CoV-2 target nucleic acids are NOT DETECTED.  The SARS-CoV-2 RNA is generally detectable in upper respiratory specimens during the acute phase of infection. The lowest concentration of SARS-CoV-2 viral copies this assay can detect is 138 copies/mL. A negative result does not preclude SARS-Cov-2 infection and should not be used as the sole basis for treatment or other patient management decisions. A negative result may occur with  improper specimen collection/handling, submission of specimen other than nasopharyngeal swab, presence of viral mutation(s) within the areas targeted by this assay, and inadequate number of viral copies(<138 copies/mL). A negative result must be combined with clinical observations, patient history, and epidemiological information. The expected result is Negative.  Fact Sheet for Patients:  EntrepreneurPulse.com.au  Fact Sheet for Healthcare Providers:  IncredibleEmployment.be  This test is no t yet approved or cleared by the [place] FDA and  has been authorized for detection and/or diagnosis of SARS-CoV-2 by FDA under an Emergency Use Authorization (EUA). This EUA will remain  in effect (meaning this test can be used) for the  duration of the COVID-19 declaration under Section 564(b)(1) of the Act, 21 U.S.C.section 360bbb-3(b)(1), unless the authorization is terminated  or revoked sooner.       Influenza A by PCR NEGATIVE NEGATIVE Final   Influenza B by PCR NEGATIVE NEGATIVE Final    Comment: (NOTE) The Xpert Xpress SARS-CoV-2/FLU/RSV plus assay is intended as an aid in the diagnosis of influenza from Nasopharyngeal swab specimens and should not be used as a sole basis for treatment. Nasal washings and aspirates are unacceptable for Xpert Xpress SARS-CoV-2/FLU/RSV testing.  Fact Sheet for Patients: EntrepreneurPulse.com.au  Fact Sheet for Healthcare Providers: IncredibleEmployment.be  This test is not yet approved or cleared by the [place] FDA and has  been authorized for detection and/or diagnosis of SARS-CoV-2 by FDA under an Emergency Use Authorization (EUA). This EUA will remain in effect (meaning this test can be used) for the duration of the COVID-19 declaration under Section 564(b)(1) of the Act, 21 U.S.C. section 360bbb-3(b)(1), unless the authorization is terminated or revoked.  Performed at [facility], [address] [place], [place] [address]     Procedures and diagnostic studies:  DG Chest 1 View  Result Date: 12/03/2020 CLINICAL DATA:  Fall today. EXAM: CHEST  1 VIEW COMPARISON:  January 23, 2011. FINDINGS: The heart size and mediastinal contours are within normal limits. Both lungs are clear. No pneumothorax or pleural effusion is noted. Hyperexpansion of the lungs is noted. The visualized skeletal structures are unremarkable. IMPRESSION: No active disease. Aortic Atherosclerosis (ICD10-I70.0). Electronically Signed   By: Marijo Conception M.D.   On: 12/03/2020 19:40   DG Knee Left Port  Result Date: 12/04/2020 CLINICAL DATA:  Left knee pain after fall yesterday. EXAM: PORTABLE LEFT KNEE - 1-2 VIEW COMPARISON:  None. FINDINGS:  Diffuse decreased bone mineralization. No evidence of acute fracture or dislocation. No significant joint effusion. Calcified plaque over the femoral, popliteal and runoff vessels. IMPRESSION: No acute findings. Electronically Signed   By: Marin Olp M.D.   On: 12/04/2020 10:17   DG HIP UNILAT WITH PELVIS 2-3 VIEWS LEFT  Result Date: 12/04/2020 CLINICAL DATA:  Status post left hemiarthroplasty. EXAM: DG HIP (WITH OR WITHOUT PELVIS) 2-3V LEFT COMPARISON:  None. FINDINGS: Left hip prosthesis appears to be well situated. Expected postoperative changes are seen in the surrounding soft tissues. IMPRESSION: Status post left hip arthroplasty. Electronically Signed   By: Marijo Conception M.D.   On: 12/04/2020 15:12   DG Hip Unilat With Pelvis 2-3 Views Left  Result Date: 12/03/2020 CLINICAL DATA:  Left hip pain after fall. EXAM: DG HIP (WITH OR WITHOUT PELVIS) 2-3V LEFT COMPARISON:  None. FINDINGS: Moderately angulated fracture is seen involving the proximal left femoral neck. Right hip is unremarkable. IMPRESSION: Moderately angulated proximal left femoral neck fracture. Electronically Signed   By: Marijo Conception M.D.   On: 12/03/2020 19:38    Medications:   . acetaminophen  650 mg Oral Q6H  . amLODipine  10 mg Oral Daily  . docusate sodium  100 mg Oral BID  . enoxaparin (LOVENOX) injection  40 mg Subcutaneous Q24H  . feeding supplement  237 mL Oral BID BM  . ipratropium-albuterol  3 mL Nebulization QID  . nicotine  7 mg Transdermal Daily   Continuous Infusions: . lactated ringers    . methocarbamol (ROBAXIN) IV       LOS: 1 day   Geradine Girt  [facility]   How to contact the [facility] Attending or Consulting provider [place] or covering provider during after hours [place], for this patient?  1. Check the care team in [facility] and look for a) attending/consulting TRH provider listed and b) the [facility] team listed 2. Log into [email or web address] and use [place] universal password to access. If  you do not have the password, please contact the hospital operator. 3. Locate the [facility] provider you are looking for under [facility] and page to a number that you can be directly reached. 4. If you still have difficulty reaching the provider, please page the [facility] (Director on Call) for the Hospitalists listed on amion for assistance.  12/05/2020, 9:17 AM

## 2020-12-05 NOTE — Evaluation (Signed)
Physical Therapy Evaluation Patient Details Name: Sergio Chavez MRN: [id number] DOB: [birth year] Today's Date: 12/05/2020   History of Present Illness  Sergio Chavez is a 80 y.o. male with history of hypertension, hyperlipidemia, tobacco abuse had a fall at home while working at home; sustained a L hip fracture, now s/p L hip hemiarthroplasty for fixation, Post Hip Prec, WBAT  Clinical Impression   Patient is s/p above surgery resulting in functional limitations due to the deficits listed below (see PT Problem List). Lives at home with his wife (who cannot give physical assist), and independent prior to this admission; Presents to PT with L hip pain limiting mobility, decr functional mobility, decr activity tolerance; Lightheaded after walking in his room approx 8 feet;  Needed lots of reminders for Posterior Hip Precautions;  Pt's son-in-law expressed concerns about dc'ing home as pt's wife is unable to give physical assist, and adult children live 30 minutes away (and another daughter in [place]); Still, even with a considerable amount of pain, Sergio Chavez was able to get up to EOB, stand up, and take a few steps with overall min assist;  Patient will benefit from skilled PT to increase their independence and safety with mobility to allow discharge to the venue listed below.    Plan to perform Orthostatic BPs next session; I'm hopeful that he will be able to progress well, and get home (pt clearly states getting home is his goal as well); I'm curious if his wife can help with reminders for Posterior Hip Precautions, and he could progress well enough to not need physical assist     Follow Up Recommendations Home health PT;Supervision/Assistance - 24 hour;Other (comment) (Can the family arrange for at or near around the clock assist, and consider hiring a Traer?  If slow progress, must consider post-acute rehab)    Equipment Recommendations  Rolling walker with 5" wheels;3in1 (PT)     Recommendations for Other Services OT consult (as ordered)     Precautions / Restrictions Precautions Precautions: Posterior Hip;Fall Precaution Booklet Issued: Yes (comment) Precaution Comments: Very dizzy walking to window and had to sit -- consider orthostatics Restrictions LLE Weight Bearing: Weight bearing as tolerated Other Position/Activity Restrictions: LLE WBAT      Mobility  Bed Mobility Overal bed mobility: Needs Assistance Bed Mobility: Supine to Sit     Supine to sit: Min assist     General bed mobility comments: min handheld assist to pull up to sit; close guard and lots of cues for Post Hip Prec    Transfers Overall transfer level: Needs assistance Equipment used: Rolling walker (2 wheeled) Transfers: Sit to/from Stand Sit to Stand: Min assist         General transfer comment: Cues for hand placement and safety, as well as LLE placement to adhere to post hip prec; good control of descent to sit, even when dizzy  Ambulation/Gait Ambulation/Gait assistance: Min assist;+2 safety/equipment (Chair push) Gait Distance (Feet): 8 Feet Assistive device: Rolling walker (2 wheeled) Gait Pattern/deviations: Step-through pattern (emerging)     General Gait Details: cues for step sequence, and optimal positioning of RW; tending to step past front wheels, leading to slight backwards bias; Walked from the bed to the window, then became dizzy, so sat to the Physicist, medical    Modified Rankin (Stroke Patients Only)       Balance Overall balance assessment: Needs assistance  Sitting balance-Leahy Scale: Fair       Standing balance-Leahy Scale: Poor                               Pertinent Vitals/Pain Pain Assessment: 0-10 Pain Score: 7  Pain Location: L hip Pain Descriptors / Indicators: Aching Pain Intervention(s): Monitored during session;Repositioned;Patient requesting pain meds-RN notified     Home Living Family/patient expects to be discharged to:: Private residence Living Arrangements: Spouse/significant other Available Help at Discharge: Family;Other (Comment) Type of Home: House Home Access: Stairs to enter Entrance Stairs-Rails: None Entrance Stairs-Number of Steps: 3-4 Home Layout: One level Home Equipment: Cane - single point;Crutches;Grab bars - tub/shower      Prior Function Level of Independence: Independent         Comments:  (wasn't using cane, independent at baseline, driving)     Hand Dominance   Dominant Hand: Right    Extremity/Trunk Assessment   Upper Extremity Assessment Upper Extremity Assessment: Defer to OT evaluation    Lower Extremity Assessment Lower Extremity Assessment: LLE deficits/detail LLE Deficits / Details: Overall good muscle activation about hip joint and adequate ROM for transfers and amb       Communication   Communication: Other (comment);HOH (hard of hearing)  Cognition Arousal/Alertness: Awake/alert Behavior During Therapy: WFL for tasks assessed/performed Overall Cognitive Status: Within Functional Limits for tasks assessed Area of Impairment: Safety/judgement;Awareness                         Safety/Judgement: Decreased awareness of safety;Decreased awareness of deficits Awareness: Intellectual   General Comments: Unable to repeat Post Hip Precautions at end of session; Unable to connect the concept of having a pillow between his knees to help with Post Hip Prec      General Comments General comments (skin integrity, edema, etc.): Initiated session on Room Air, and O2 sats decr to 86%; restarted O2 at 2 L and sats incr back to 95%    Exercises     Assessment/Plan    PT Assessment Patient needs continued PT services  PT Problem List Decreased strength;Decreased range of motion;Decreased activity tolerance;Decreased balance;Decreased mobility;Decreased coordination;Decreased  cognition;Decreased knowledge of use of DME;Decreased safety awareness;Decreased knowledge of precautions;Pain       PT Treatment Interventions DME instruction;Gait training;Stair training;Functional mobility training;Therapeutic activities;Therapeutic exercise;Balance training;Neuromuscular re-education;Cognitive remediation;Patient/family education    PT Goals (Current goals can be found in the Care Plan section)  Acute Rehab PT Goals Patient Stated Goal: Wants to be home PT Goal Formulation: With patient Time For Goal Achievement: 12/19/20 Potential to Achieve Goals: Good    Frequency Min 6X/week   Barriers to discharge Other (comment) Wife cannot provide physical assist, and other family members limited in how much they can be over to support pt and wife; I'm curious if his wife would be able to help with cueing and monitoring for Post Hip Prec; I anticipate he will progress well with funcitonal mobiltiy, and will likely get to a functional level where he doesn't require phsyical assist, in which case, we could get him straight home -- I wonder what resources they have for an Aide at home    Co-evaluation               AM-PAC PT "6 Clicks" Mobility  Outcome Measure Help needed turning from your back to your side while in a flat bed without using bedrails?: A Little  Help needed moving from lying on your back to sitting on the side of a flat bed without using bedrails?: A Little Help needed moving to and from a bed to a chair (including a wheelchair)?: A Little Help needed standing up from a chair using your arms (e.g., wheelchair or bedside chair)?: A Little Help needed to walk in hospital room?: A Little Help needed climbing 3-5 steps with a railing? : A Lot 6 Click Score: 17    End of Session Equipment Utilized During Treatment: Gait belt Activity Tolerance: Patient tolerated treatment well;Other (comment) (though dizzy with initial amb; consider orthostaticsnext  session) Patient left: in chair;with chair alarm set;with call bell/phone within reach;with family/visitor present Nurse Communication: Mobility status PT Visit Diagnosis: Unsteadiness on feet (R26.81);Other abnormalities of gait and mobility (R26.89);Pain Pain - Right/Left: Left Pain - part of body: Hip    Time: 1008-1040 PT Time Calculation (min) (ACUTE ONLY): 32 min   Charges:   PT Evaluation $PT Eval Moderate Complexity: 1 Mod PT Treatments $Gait Training: 8-22 mins        Roney Marion, PT  Acute Rehabilitation Services Pager [phone] Office [phone]   [place] 12/05/2020, 5:47 PM

## 2020-12-06 ENCOUNTER — Encounter (HOSPITAL_COMMUNITY): Payer: Self-pay | Admitting: Orthopedic Surgery

## 2020-12-06 DIAGNOSIS — E871 Hypo-osmolality and hyponatremia: Secondary | ICD-10-CM | POA: Diagnosis not present

## 2020-12-06 DIAGNOSIS — S72002A Fracture of unspecified part of neck of left femur, initial encounter for closed fracture: Secondary | ICD-10-CM | POA: Diagnosis not present

## 2020-12-06 LAB — BASIC METABOLIC PANEL
Anion gap: 13 (ref 5–15)
BUN: 22 mg/dL (ref 8–23)
CO2: 21 mmol/L — ABNORMAL LOW (ref 22–32)
Calcium: 9.1 mg/dL (ref 8.9–10.3)
Chloride: 93 mmol/L — ABNORMAL LOW (ref 98–111)
Creatinine, Ser: 0.92 mg/dL (ref 0.61–1.24)
GFR, Estimated: >60 mL/min (ref >60)
Glucose, Bld: 103 mg/dL — ABNORMAL HIGH (ref 70–99)
Potassium: 3.8 mmol/L (ref 3.5–5.1)
Sodium: 127 mmol/L — ABNORMAL LOW (ref 135–145)

## 2020-12-06 LAB — CBC
HCT: 32 % — ABNORMAL LOW (ref 39.0–52.0)
Hemoglobin: 11.4 g/dL — ABNORMAL LOW (ref 13.0–17.0)
MCH: 30.3 pg (ref 26.0–34.0)
MCHC: 35.6 g/dL (ref 30.0–36.0)
MCV: 85.1 fL (ref 80.0–100.0)
Platelets: 184 10*3/uL (ref 150–400)
RBC: 3.76 MIL/uL — ABNORMAL LOW (ref 4.22–5.81)
RDW: 13.7 % (ref 11.5–15.5)
WBC: 7.5 10*3/uL (ref 4.0–10.5)
nRBC: 0 % (ref 0.0–0.2)

## 2020-12-06 MED ORDER — ASCORBIC ACID 500 MG PO TABS
500.0000 mg | ORAL_TABLET | Freq: Every day | ORAL | Status: DC
  Administered 2020-12-08: 500 mg via ORAL
  Filled 2020-12-06 (×2): qty 1

## 2020-12-06 MED ORDER — VITAMIN D 25 MCG (1000 UNIT) PO TABS
2000.0000 [IU] | ORAL_TABLET | Freq: Two times a day (BID) | ORAL | Status: DC
  Administered 2020-12-06 – 2020-12-08 (×5): 2000 [IU] via ORAL
  Filled 2020-12-06 (×5): qty 2

## 2020-12-06 NOTE — Progress Notes (Signed)
Occupational Therapy Treatment Patient Details Name: Sergio Chavez MRN: [id number] DOB: 03/26/1941 Today's Date: 12/06/2020    History of present illness TORBEN SOLOWAY is a 80 y.o. male with history of hypertension, hyperlipidemia, tobacco abuse had a fall at home while working at home; sustained a L hip fracture, now s/p L hip hemiarthroplasty for fixation, Post Hip Prec, WBAT   OT comments  Reinforced post hip precautions, use of AE and recommended pt consider tub transfer bench once he goes home. Pt has agreed to [facility] in SNF.   Follow Up Recommendations  SNF;Supervision/Assistance - 24 hour    Equipment Recommendations  Other (comment) (defer to next venue)    Recommendations for Other Services      Precautions / Restrictions Precautions Precautions: Posterior Hip;Fall Precaution Booklet Issued: Yes (comment) Precaution Comments: reinforced posterior hip precautions related to LB bathing and dressing Restrictions LLE Weight Bearing: Weight bearing as tolerated       Mobility Bed Mobility Overal bed mobility: Needs Assistance Bed Mobility: Sit to Supine       Sit to supine: Min assist   General bed mobility comments: assist for LLE    Transfers Overall transfer level: Needs assistance Equipment used: Rolling walker (2 wheeled) Transfers: Sit to/from Stand Sit to Stand: Min guard         General transfer comment: cues for technique    Balance Overall balance assessment: Needs assistance   Sitting balance-Leahy Scale: Good     Standing balance support: Bilateral upper extremity supported;During functional activity Standing balance-Leahy Scale: Poor Standing balance comment: reliant on RW                           ADL either performed or assessed with clinical judgement   ADL Overall ADL's : Needs assistance/impaired               Lower Body Bathing Details (indicate cue type and reason): instructed in benefits of long handled  bath sponge and reacher       Lower Body Dressing Details (indicate cue type and reason): instructed in benefits of reacher, sock aid                     Vision       Perception     Praxis      Cognition Arousal/Alertness: Awake/alert Behavior During Therapy: WFL for tasks assessed/performed Overall Cognitive Status: Impaired/Different from baseline Area of Impairment: Safety/judgement                         Safety/Judgement: Decreased awareness of safety;Decreased awareness of deficits     General Comments: pt agreeable to SNF for ST rehab after conversation about benefits        Exercises     Shoulder Instructions       General Comments      Pertinent Vitals/ Pain       Pain Assessment: Faces Faces Pain Scale: Hurts little more Pain Location: L hip Pain Descriptors / Indicators: Aching Pain Intervention(s): Monitored during session;Repositioned  Home Living                                          Prior Functioning/Environment              Frequency  Min 2X/week        Progress Toward Goals  OT Goals(current goals can now be found in the care plan section)  Progress towards OT goals: Progressing toward goals  Acute Rehab OT Goals Patient Stated Goal: Wants to be home, but agreed to ST rehab OT Goal Formulation: With patient/family Time For Goal Achievement: 12/19/20 Potential to Achieve Goals: Good  Plan Discharge plan needs to be updated    Co-evaluation                 AM-PAC OT "6 Clicks" Daily Activity     Outcome Measure   Help from another person eating meals?: None Help from another person taking care of personal grooming?: A Little Help from another person toileting, which includes using toliet, bedpan, or urinal?: A Little Help from another person bathing (including washing, rinsing, drying)?: A Lot Help from another person to put on and taking off regular upper body clothing?:  None Help from another person to put on and taking off regular lower body clothing?: A Lot 6 Click Score: 18    End of Session Equipment Utilized During Treatment: Gait belt;Rolling walker  OT Visit Diagnosis: Unsteadiness on feet (R26.81);Muscle weakness (generalized) (M62.81);Pain   Activity Tolerance Patient tolerated treatment well;Patient limited by fatigue   Patient Left in bed;with call bell/phone within reach;with family/visitor present   Nurse Communication          Time: 1173-5670 OT Time Calculation (min): 18 min  Charges: OT General Charges $OT Visit: 1 Visit OT Treatments $Self Care/Home Management : 8-22 mins  Nestor Lewandowsky, OTR/L Acute Rehabilitation Services Pager: [phone] Office: [phone]   Malka So 12/06/2020, 2:04 PM

## 2020-12-06 NOTE — NC FL2 (Signed)
[place] LEVEL OF CARE SCREENING TOOL     IDENTIFICATION  Patient Name: Sergio Chavez Birthdate: Dec 04, 1940 Sex: male Admission Date (Current Location): 12/03/2020  [facility] and [place] Number:  [place] (Simultaneous filing. User may not have seen previous data.)   Facility and Address:  [facility] [place] [address], [place], [place] [address] (Simultaneous filing. User may not have seen previous data.)      Provider Number: [id number] (Simultaneous filing. User may not have seen previous data.)  Attending Physician Name and Address:  Geradine Girt, DO  Relative Name and Phone Number:       Current Level of Care: Hospital Recommended Level of Care: [place] Prior Approval Number:    Date Approved/Denied:   PASRR Number: [id number]  Discharge Plan: SNF    Current Diagnoses: Patient Active Problem List   Diagnosis Date Noted  . Hyponatremia 12/04/2020  . Essential hypertension 12/04/2020  . Hip fracture ([place]) 12/04/2020  . Closed left hip fracture ([place]) 12/03/2020    Orientation RESPIRATION BLADDER Height & Weight     Self,Time,Situation,Place  Normal Continent Weight: 140 lb (63.5 kg) Height:  5\' 7"  (170.2 cm)  BEHAVIORAL SYMPTOMS/MOOD NEUROLOGICAL BOWEL NUTRITION STATUS      Continent Diet (See discharge summary)  AMBULATORY STATUS COMMUNICATION OF NEEDS Skin   Extensive Assist Verbally Normal,Surgical wounds                       Personal Care Assistance Level of Assistance  Bathing,Feeding,Dressing Bathing Assistance: Maximum assistance Feeding assistance: Independent Dressing Assistance: Maximum assistance     Functional Limitations Info  Sight,Hearing,Speech Sight Info: Adequate Hearing Info: Adequate Speech Info: Adequate    SPECIAL CARE FACTORS FREQUENCY  PT (By licensed PT),OT (By licensed OT)     PT Frequency: 5x a week OT Frequency: 5x  a week            Contractures  Contractures Info: Not present    Additional Factors Info  Code Status,Allergies Code Status Info: Full Allergies Info: NKA           Current Medications (12/06/2020):  This is the current hospital active medication list Current Facility-Administered Medications  Medication Dose Route Frequency Provider Last Rate Last Admin  . acetaminophen (TYLENOL) tablet 650 mg  650 mg Oral Q6H Ainsley Spinner, PA-C   650 mg at 12/05/20 2035  . albuterol (PROVENTIL) (2.5 MG/3ML) 0.083% nebulizer solution 2.5 mg  2.5 mg Nebulization Q2H PRN Ainsley Spinner, PA-C      . alum & mag hydroxide-simeth (MAALOX/MYLANTA) 200-200-20 MG/5ML suspension 30 mL  30 mL Oral Q4H PRN Eulogio Bear U, DO      . amLODipine (NORVASC) tablet 10 mg  10 mg Oral Daily Ainsley Spinner, PA-C   10 mg at 12/06/20 1014  . ascorbic acid (VITAMIN C) tablet 500 mg  500 mg Oral Daily Ainsley Spinner, PA-C      . cholecalciferol (VITAMIN D3) tablet 2,000 Units  2,000 Units Oral BID Ainsley Spinner, PA-C   2,000 Units at 12/06/20 1049  . docusate sodium (COLACE) capsule 100 mg  100 mg Oral BID Ainsley Spinner, PA-C   100 mg at 12/06/20 1015  . enoxaparin (LOVENOX) injection 40 mg  40 mg Subcutaneous Q24H Ainsley Spinner, PA-C   40 mg at 12/06/20 0841  . feeding supplement (ENSURE ENLIVE / ENSURE PLUS) liquid 237 mL  237 mL Oral BID BM Ainsley Spinner, PA-C  237 mL at 12/06/20 1015  . labetalol (NORMODYNE) injection 10 mg  10 mg Intravenous Q2H PRN Ainsley Spinner, PA-C      . lactated ringers infusion   Intravenous Continuous Ainsley Spinner, PA-C   Continued from Pre-op at 12/04/20 1057  . lisinopril (ZESTRIL) tablet 20 mg  20 mg Oral Daily Vann, Jessica U, DO   20 mg at 12/06/20 1014  . menthol-cetylpyridinium (CEPACOL) lozenge 3 mg  1 lozenge Oral PRN Ainsley Spinner, PA-C       Or  . phenol (CHLORASEPTIC) mouth spray 1 spray  1 spray Mouth/Throat PRN Ainsley Spinner, PA-C      . methocarbamol (ROBAXIN) tablet 500 mg  500 mg Oral Q6H PRN Ainsley Spinner, PA-C   500 mg at 12/04/20 1732    Or  . methocarbamol (ROBAXIN) 500 mg in dextrose 5 % 50 mL IVPB  500 mg Intravenous Q6H PRN Ainsley Spinner, PA-C      . metoCLOPramide (REGLAN) tablet 5-10 mg  5-10 mg Oral Q8H PRN Ainsley Spinner, PA-C       Or  . metoCLOPramide (REGLAN) injection 5-10 mg  5-10 mg Intravenous Q8H PRN Ainsley Spinner, PA-C      . morphine 2 MG/ML injection 0.5 mg  0.5 mg Intravenous Q2H PRN Ainsley Spinner, PA-C      . nicotine (NICODERM CQ - dosed in mg/24 hr) patch 7 mg  7 mg Transdermal Daily Ainsley Spinner, PA-C      . ondansetron [facility]) tablet 4 mg  4 mg Oral Q6H PRN Ainsley Spinner, PA-C       Or  . ondansetron [facility]) injection 4 mg  4 mg Intravenous Q6H PRN Ainsley Spinner, PA-C      . sodium chloride (OCEAN) 0.65 % nasal spray 1 spray  1 spray Each Nare PRN Ainsley Spinner, PA-C         Discharge Medications: Please see discharge summary for a list of discharge medications.  Relevant Imaging Results:  Relevant Lab Results:   Additional Information SSN [id number]  Emeterio Reeve, [place]

## 2020-12-06 NOTE — Progress Notes (Signed)
Progress Note    Sergio Chavez  LOV:[id number] DOB: [birth year]  DOA: 12/03/2020 PCP: Saralyn Pilar, MD    Brief Narrative:    Medical records reviewed and are as summarized below:  Sergio Chavez is an 80 y.o. male  with history of hypertension, hyperlipidemia, tobacco abuse had a fall at home while working at home.  Denies hitting his head or losing consciousness.  Was brought to the ER admits in [facility] due to pain in the left hip area.  OR on 2/26.  Await SNF placement.    Assessment/Plan:   Principal Problem:   Closed left hip fracture (HCC) Active Problems:   Hyponatremia   Essential hypertension   Hip fracture (HCC)   Left hip fracture status post mechanical fall.   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  In September 2021 patient sodium was around 130.   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  Tobacco abuse -encouraged cessation    Family Communication/Anticipated D/C date and plan/Code Status   DVT prophylaxis: Lovenox ordered. Code Status: Full Code.  Family Communication: daughter Disposition Plan: Status is: Inpatient  Remains inpatient appropriate because:Inpatient level of care appropriate due to severity of illness   Dispo: The patient is from: Home              Anticipated d/c is to: tbd- SNF              Patient currently is medically stable to d/c.   Difficult to place patient No         Medical Consultants:    ortho  Subjective:   Drinking an ensure  Objective:    Vitals:   12/05/20 1431 12/05/20 1607 12/06/20 0228 12/06/20 0821  BP: (!) 142/84 136/64 129/62 132/83  Pulse:  79 76 78  Resp:  18 19 16   Temp:  97.6 F (36.4 C) 98.1 F (36.7 C) 98.2 F (36.8 C)  TempSrc:  Oral Oral Oral  SpO2:  91% 96% 97%  Weight:       Height:        Intake/Output Summary (Last 24 hours) at 12/06/2020 1004 Last data filed at 12/05/2020 2345 Gross per 24 hour  Intake 960 ml  Output 500 ml  Net 460 ml   Filed Weights   12/03/20 1810  Weight: 63.5 kg    Exam:  General: Appearance:    elderly male in no acute distress     Lungs:     respirations unlabored  Heart:    Normal heart rate. Normal rhythm. No murmurs, rubs, or gallops.   MS:   All extremities are intact.   Neurologic:   Awake, alert, mildly hard of hearing     Data Reviewed:   I have personally reviewed following labs and imaging studies:  Labs: Labs show the following:   Basic Metabolic Panel: Recent Labs  Lab 12/03/20 1909 12/04/20 0331 12/04/20 0627 12/05/20 0118 12/06/20 0131  NA 129* 131* 130* 129* 127*  K 4.1 4.7 4.1 4.1 3.8  CL 95* 95* 95* 94* 93*  CO2 24 26 26 23  21*  GLUCOSE 132* 123* 111* 118* 103*  BUN 19 18 18 22 22   CREATININE 1.09 1.11 1.06 1.05 0.92  CALCIUM 9.9 9.9 9.6 9.3 9.1   GFR Estimated Creatinine Clearance:  57.5 mL/min (by C-G formula based on SCr of 0.92 mg/dL). Liver Function Tests: Recent Labs  Lab 12/04/20 0331  AST 20  ALT 12  ALKPHOS 68  BILITOT 1.0  PROT 6.2*  ALBUMIN 3.7   No results for input(s): LIPASE, AMYLASE in the last 168 hours. No results for input(s): AMMONIA in the last 168 hours. Coagulation profile Recent Labs  Lab 12/03/20 1909  INR 1.0    CBC: Recent Labs  Lab 12/03/20 1909 12/04/20 0331 12/05/20 0118 12/06/20 0131  WBC 11.6* 10.1 9.2 7.5  NEUTROABS 10.0* 8.5*  --   --   HGB 14.5 13.9 12.2* 11.4*  HCT 42.5 39.3 34.6* 32.0*  MCV 86.2 85.2 85.9 85.1  PLT 284 259 210 184   Cardiac Enzymes: No results for input(s): CKTOTAL, CKMB, CKMBINDEX, TROPONINI in the last 168 hours. BNP (last 3 results) No results for input(s): PROBNP in the last 8760 hours. CBG: No results for input(s): GLUCAP in the last 168 hours. D-Dimer: No results for input(s): DDIMER in the last  72 hours. Hgb A1c: No results for input(s): HGBA1C in the last 72 hours. Lipid Profile: No results for input(s): CHOL, HDL, LDLCALC, TRIG, CHOLHDL, LDLDIRECT in the last 72 hours. Thyroid function studies: Recent Labs    12/04/20 0331  TSH 1.190   Anemia work up: No results for input(s): VITAMINB12, FOLATE, FERRITIN, TIBC, IRON, RETICCTPCT in the last 72 hours. Sepsis Labs: Recent Labs  Lab 12/03/20 1909 12/04/20 0331 12/05/20 0118 12/06/20 0131  WBC 11.6* 10.1 9.2 7.5    Microbiology Recent Results (from the past 240 hour(s))  Resp Panel by RT-PCR (Flu A&B, Covid) Nasopharyngeal Swab     Status: None   Collection Time: 12/03/20  8:10 PM   Specimen: Nasopharyngeal Swab; Nasopharyngeal(NP) swabs in vial transport medium  Result Value Ref Range Status   SARS Coronavirus 2 by RT PCR NEGATIVE NEGATIVE Final    Comment: (NOTE) SARS-CoV-2 target nucleic acids are NOT DETECTED.  The SARS-CoV-2 RNA is generally detectable in upper respiratory specimens during the acute phase of infection. The lowest concentration of SARS-CoV-2 viral copies this assay can detect is 138 copies/mL. A negative result does not preclude SARS-Cov-2 infection and should not be used as the sole basis for treatment or other patient management decisions. A negative result may occur with  improper specimen collection/handling, submission of specimen other than nasopharyngeal swab, presence of viral mutation(s) within the areas targeted by this assay, and inadequate number of viral copies(<138 copies/mL). A negative result must be combined with clinical observations, patient history, and epidemiological information. The expected result is Negative.  Fact Sheet for Patients:  EntrepreneurPulse.com.au  Fact Sheet for Healthcare Providers:  IncredibleEmployment.be  This test is no t yet approved or cleared by the [place] FDA and  has been authorized for detection  and/or diagnosis of SARS-CoV-2 by FDA under an Emergency Use Authorization (EUA). This EUA will remain  in effect (meaning this test can be used) for the duration of the COVID-19 declaration under Section 564(b)(1) of the Act, 21 U.S.C.section 360bbb-3(b)(1), unless the authorization is terminated  or revoked sooner.       Influenza A by PCR NEGATIVE NEGATIVE Final   Influenza B by PCR NEGATIVE NEGATIVE Final    Comment: (NOTE) The Xpert Xpress SARS-CoV-2/FLU/RSV plus assay is intended as an aid in the diagnosis of influenza from Nasopharyngeal swab specimens and should not be used as a sole basis for treatment. Nasal washings and aspirates are unacceptable  for Xpert Xpress SARS-CoV-2/FLU/RSV testing.  Fact Sheet for Patients: EntrepreneurPulse.com.au  Fact Sheet for Healthcare Providers: IncredibleEmployment.be  This test is not yet approved or cleared by the [place] FDA and has been authorized for detection and/or diagnosis of SARS-CoV-2 by FDA under an Emergency Use Authorization (EUA). This EUA will remain in effect (meaning this test can be used) for the duration of the COVID-19 declaration under Section 564(b)(1) of the Act, 21 U.S.C. section 360bbb-3(b)(1), unless the authorization is terminated or revoked.  Performed at [facility], [address] [place], [place] [address]     Procedures and diagnostic studies:  DG Knee Left Port  Result Date: 12/04/2020 CLINICAL DATA:  Left knee pain after fall yesterday. EXAM: PORTABLE LEFT KNEE - 1-2 VIEW COMPARISON:  None. FINDINGS: Diffuse decreased bone mineralization. No evidence of acute fracture or dislocation. No significant joint effusion. Calcified plaque over the femoral, popliteal and runoff vessels. IMPRESSION: No acute findings. Electronically Signed   By: Marin Olp M.D.   On: 12/04/2020 10:17   DG HIP UNILAT WITH PELVIS 2-3 VIEWS LEFT  Result Date:  12/04/2020 CLINICAL DATA:  Status post left hemiarthroplasty. EXAM: DG HIP (WITH OR WITHOUT PELVIS) 2-3V LEFT COMPARISON:  None. FINDINGS: Left hip prosthesis appears to be well situated. Expected postoperative changes are seen in the surrounding soft tissues. IMPRESSION: Status post left hip arthroplasty. Electronically Signed   By: Marijo Conception M.D.   On: 12/04/2020 15:12    Medications:   . acetaminophen  650 mg Oral Q6H  . amLODipine  10 mg Oral Daily  . docusate sodium  100 mg Oral BID  . enoxaparin (LOVENOX) injection  40 mg Subcutaneous Q24H  . feeding supplement  237 mL Oral BID BM  . lisinopril  20 mg Oral Daily  . nicotine  7 mg Transdermal Daily   Continuous Infusions: . lactated ringers    . methocarbamol (ROBAXIN) IV       LOS: 2 days   Geradine Girt  [facility]   How to contact the [facility] Attending or Consulting provider [place] or covering provider during after hours [place], for this patient?  1. Check the care team in [facility] and look for a) attending/consulting TR[facility] provider listed and b) the [facility] team listed 2. Log into [email or web address] and use [place]'s universal password to access. If you do not have the password, please contact the hospital operator. 3. Locate the [facility] provider you are looking for under [facility] and page to a number that you can be directly reached. 4. If you still have difficulty reaching the provider, please page the [facility] (Director on Call) for the Hospitalists listed on amion for assistance.  12/06/2020, 10:04 AM

## 2020-12-06 NOTE — Progress Notes (Addendum)
Orthopaedic Trauma Service Progress Note  Patient ID: Sergio Chavez MRN: [id number] DOB/AGE: 01/26/1941 80 y.o.  Subjective:  Doing well Good spirits No complaints other than wanting to go home   Therapy notes reviewed  No assistive devices prior to fall but daughter states that pts balance is unsteady   ROS As above Objective:   VITALS:   Vitals:   12/05/20 1431 12/05/20 1607 12/06/20 0228 12/06/20 0821  BP: (!) 142/84 136/64 129/62 132/83  Pulse:  79 76 78  Resp:  18 19 16   Temp:  97.6 F (36.4 C) 98.1 F (36.7 C) 98.2 F (36.8 C)  TempSrc:  Oral Oral Oral  SpO2:  91% 96% 97%  Weight:      Height:        Estimated body mass index is 21.93 kg/m as calculated from the following:   Height as of this encounter: 5\' 7"  (1.702 m).   Weight as of this encounter: 63.5 kg.   Intake/Output      02/27 0701 02/28 0700 02/28 0701 03/01 0700   P.O. 1200 237   I.V. (mL/kg)     IV Piggyback     Total Intake(mL/kg) 1200 (18.9) 237 (3.7)   Urine (mL/kg/hr) 800 (0.5)    Blood     Total Output 800    Net +400 +237          LABS  Results for orders placed or performed during the hospital encounter of 12/03/20 (from the past 24 hour(s))  CBC     Status: Abnormal   Collection Time: 12/06/20  1:31 AM  Result Value Ref Range   WBC 7.5 4.0 - 10.5 K/uL   RBC 3.76 (L) 4.22 - 5.81 MIL/uL   Hemoglobin 11.4 (L) 13.0 - 17.0 g/dL   HCT 32.0 (L) 39.0 - 52.0 %   MCV 85.1 80.0 - 100.0 fL   MCH 30.3 26.0 - 34.0 pg   MCHC 35.6 30.0 - 36.0 g/dL   RDW 13.7 11.5 - 15.5 %   Platelets 184 150 - 400 K/uL   nRBC 0.0 0.0 - 0.2 %  Basic metabolic panel     Status: Abnormal   Collection Time: 12/06/20  1:31 AM  Result Value Ref Range   Sodium 127 (L) 135 - 145 mmol/L   Potassium 3.8 3.5 - 5.1 mmol/L   Chloride 93 (L) 98 - 111 mmol/L   CO2 21 (L) 22 - 32 mmol/L   Glucose, Bld 103 (H) 70 - 99 mg/dL   BUN 22 8  - 23 mg/dL   Creatinine, Ser 0.92 0.61 - 1.24 mg/dL   Calcium 9.1 8.9 - 10.3 mg/dL   GFR, Estimated >60 >60 mL/min   Anion gap 13 5 - 15     PHYSICAL EXAM:   Gen: Resting comfortably in bed, no acute distress, appears well.  Communicating clearly Ext:       Left lower extremity  Dressing clean dry and intact to left hip  Minimal swelling  Distal motor and sensory functions intact  + DP pulse  No DCT or pitting edema  Assessment/Plan: 2 Days Post-Op   Principal Problem:   Closed left hip fracture (HCC) Active Problems:   Hyponatremia   Essential hypertension   Hip fracture (HCC)   Anti-infectives (From admission, onward)  Start     Dose/Rate Route Frequency Ordered Stop   12/04/20 1700  ceFAZolin (ANCEF) IVPB 2g/100 mL premix        2 g 200 mL/hr over 30 Minutes Intravenous Every 6 hours 12/04/20 1459 12/04/20 2248   12/04/20 1045  ceFAZolin (ANCEF) IVPB 2g/100 mL premix        2 g 200 mL/hr over 30 Minutes Intravenous  Once 12/04/20 1040 12/04/20 1105   12/04/20 1042  ceFAZolin (ANCEF) 2-4 GM/100ML-% IVPB       Note to Pharmacy: Henrine Screws   : cabinet override      12/04/20 1042 12/04/20 1113    .  POD/HD#: 63  80 year old male fall with left femoral neck fracture  -Left femoral neck fracture s/p left hip hemiarthroplasty   Weight-bear as tolerated left leg with assistance  Posterior hip precautions  PT OT  Dressing changes as needed   Okay to shower and clean wound with soap and water  Ice as needed for swelling and pain control   - Pain management:  Multimodal  Only took Tylenol yesterday.  Has not had anything for pain today  - ABL anemia/Hemodynamics  Stable  - Medical issues   Per primary team   Hyponatremia   Nicotine dependence   - DVT/PE prophylaxis:  Lovenox 40 mg sq daily x 4 weeks - ID:   Perioperative antibiotics completed  - Metabolic Bone Disease:  Fracture indicative of osteoporosis.  Meets fracture liaison criteria.   Consult placed  Vitamin D level pending   - Activity:  Weight-bear as tolerated left leg  - Impediments to fracture healing:  Osteoporosis/fragility fracture  Nicotine dependence   - Dispo:  Continue with therapies  Home with home health versus SNF   Would like for him to work with therapy for a few more sessions to see how he is doing  Jari Pigg, PA-C [phone] (C) 12/06/2020, 10:28 AM  [facility] [place] [id number] [phone] Jenetta [phone] (F)    After 5pm and on the weekends please log on to Amion, go to orthopaedics and the look under the [facility] Call for the provider(s) on call. You can also call our office at [phone] and then follow the prompts to be connected to the call team.

## 2020-12-06 NOTE — TOC Initial Note (Signed)
Transition of Care [facility]) - Initial/Assessment Note    Patient Details  Name: Sergio Chavez MRN: [id number] Date of Birth: 1941/06/21  Transition of Care [facility]) CM/SW Contact:    Coralee Pesa, [place] Phone Number: 12/06/2020, 2:37 PM  Clinical Narrative:                 CSW met with pt and daughters at bedside. Daughters are concerned about pt returning home, even with HH, as pt's wife will not be able to provide care. Safety concerns were discussed with pt and questions were answered so he could understand next steps. Pt and family agreeable to short term rehab and a medicare list was provided. Pt has both Pfizer vaccines, but has not had the booster. CSW will complete faxout and provide bed offers when available. SW will continue to follow.  Expected Discharge Plan: Skilled Nursing Facility Barriers to Discharge: Continued Medical Work up,SNF Pending bed offer,Insurance Authorization   Patient Goals and CMS Choice Patient states their goals for this hospitalization and ongoing recovery are:: Pt states his main goal is to return home. CMS Medicare.gov Compare Post Acute Care list provided to:: Patient Represenative (must comment) (Daughter) Choice offered to / list presented to : Patient  Expected Discharge Plan and Services Expected Discharge Plan: [place] Acute Care Choice: [place] Living arrangements for the past 2 months: Single Family Home                                      Prior Living Arrangements/Services Living arrangements for the past 2 months: Single Family Home Lives with:: Spouse Patient language and need for interpreter reviewed:: Yes Do you feel safe going back to the place where you live?: Yes      Need for Family Participation in Patient Care: Yes (Comment) Care giver support system in place?: Yes (comment)   Criminal Activity/Legal Involvement Pertinent to Current Situation/Hospitalization: No - Comment  as needed  Activities of Daily Living Home Assistive Devices/Equipment: None ADL Screening (condition at time of admission) Patient's cognitive ability adequate to safely complete daily activities?: Yes Is the patient deaf or have difficulty hearing?: No Does the patient have difficulty seeing, even when wearing glasses/contacts?: No Does the patient have difficulty concentrating, remembering, or making decisions?: No Patient able to express need for assistance with ADLs?: No Does the patient have difficulty dressing or bathing?: No Independently performs ADLs?: Yes (appropriate for developmental age) Does the patient have difficulty walking or climbing stairs?: Yes Weakness of Legs: Left Weakness of Arms/Hands: None  Permission Sought/Granted Permission sought to share information with : Family Supports Permission granted to share information with : Yes, Verbal Permission Granted  Share Information with NAME: Neoma Laming     Permission granted to share info w Relationship: daughter  Permission granted to share info w Contact Information: [phone]  Emotional Assessment Appearance:: Appears stated age Attitude/Demeanor/Rapport: Engaged Affect (typically observed): Appropriate Orientation: : Oriented to Self,Oriented to Place,Oriented to  Time,Oriented to Situation Alcohol / Substance Use: Not Applicable Psych Involvement: No (comment)  Admission diagnosis:  Hyponatremia [E87.1] Closed left hip fracture ([place]) [S72.002A] Closed fracture of left hip, initial encounter ([place]) [S72.002A] Fall, initial encounter [W19.XXXA] Hip fracture ([place]) [S72.009A] Patient Active Problem List   Diagnosis Date Noted  . Hyponatremia 12/04/2020  . Essential hypertension 12/04/2020  . Hip fracture ([place]) 12/04/2020  .  Closed left hip fracture ([place]) 12/03/2020   PCP:  Saralyn Pilar, MD Pharmacy:   [facility] [place] [place] [address] [place] [address] Phone: [phone] Fax: [phone]     Social Determinants of Health (SDOH) Interventions    Readmission Risk Interventions No flowsheet data found.

## 2020-12-06 NOTE — Progress Notes (Signed)
Physical Therapy Treatment Patient Details Name: Sergio Chavez MRN: [id number] DOB: 1941/03/10 Today's Date: 12/06/2020    History of Present Illness Sergio Chavez is a 80 y.o. male with history of hypertension, hyperlipidemia, tobacco abuse had a fall at home while working at home; sustained a L hip fracture, now s/p L hip hemiarthroplasty for fixation, Post Hip Prec, WBAT    PT Comments    Continuing work on functional mobility and activity tolerance;  Session focused on safely increasing activity, and checking orthostatic BPs, which did demonstrate a drop, but pt recovered at standing 3 minutes, and was able to progress ambulation; reinforced Posterior hip Prec, including explaining to his daughters who were present during session; updated rec to [facility]    12/06/20 1516  Vital Signs  Patient Position (if appropriate) Orthostatic Vitals  Orthostatic Lying   BP- Lying 119/64  Pulse- Lying 85 (map 79)  Orthostatic Sitting  BP- Sitting 104/67  Pulse- Sitting 96 (80)  Orthostatic Standing at 0 minutes  BP- Standing at 0 minutes 103/66  Pulse- Standing at 0 minutes 96 (map 79)  Orthostatic Standing at 3 minutes  BP- Standing at 3 minutes 96/71  Pulse- Standing at 3 minutes 103 (Map 75)     Follow Up Recommendations  SNF;Other (comment) (post-acute rehab)     Equipment Recommendations  Rolling walker with 5" wheels;3in1 (PT)    Recommendations for Other Services       Precautions / Restrictions Precautions Precautions: Posterior Hip;Fall Precaution Booklet Issued: Yes (comment) Precaution Comments: reinforced posterior hip precautions related to functional mobility, amb, transfers Restrictions LLE Weight Bearing: Weight bearing as tolerated    Mobility  Bed Mobility Overal bed mobility: Needs Assistance Bed Mobility: Supine to Sit     Supine to sit: Min guard     General bed mobility comments: assist for LLE    Transfers Overall transfer level: Needs  assistance Equipment used: Rolling walker (2 wheeled) Transfers: Sit to/from Stand Sit to Stand: Min guard         General transfer comment: cues for technique  Ambulation/Gait Ambulation/Gait assistance: Min guard;+2 safety/equipment (chair push) Gait Distance (Feet): 50 Feet Assistive device: Rolling walker (2 wheeled) Gait Pattern/deviations: Step-through pattern (emerging)     General Gait Details: Cues for sequence, to self-monitor for activity tolerance, and to stand tall on LLE in single limb stance   Stairs             Wheelchair Mobility    Modified Rankin (Stroke Patients Only)       Balance Overall balance assessment: Needs assistance Sitting-balance support: Feet supported Sitting balance-Leahy Scale: Good     Standing balance support: Bilateral upper extremity supported;During functional activity Standing balance-Leahy Scale: Poor Standing balance comment: reliant on RW                            Cognition Arousal/Alertness: Awake/alert Behavior During Therapy: WFL for tasks assessed/performed Overall Cognitive Status: Impaired/Different from baseline Area of Impairment: Safety/judgement                         Safety/Judgement: Decreased awareness of safety;Decreased awareness of deficits     General Comments: Unsure that pt completely remembers conversation with OT re: benefits of short-term rehab      Exercises      General Comments General comments (skin integrity, edema, etc.): O2 sats 90-92% at end of session on  room air      Pertinent Vitals/Pain Pain Assessment: 0-10 Pain Score: 5  Pain Location: L hip Pain Descriptors / Indicators: Aching Pain Intervention(s): Monitored during session    Home Living                      Prior Function            PT Goals (current goals can now be found in the care plan section) Acute Rehab PT Goals Patient Stated Goal: Wants to be home, but agreed to  ST rehab PT Goal Formulation: With patient Time For Goal Achievement: 12/19/20 Potential to Achieve Goals: Good Progress towards PT goals: Progressing toward goals    Frequency    Min 3X/week      PT Plan Discharge plan needs to be updated;Frequency needs to be updated    Co-evaluation              AM-PAC PT "6 Clicks" Mobility   Outcome Measure  Help needed turning from your back to your side while in a flat bed without using bedrails?: A Little Help needed moving from lying on your back to sitting on the side of a flat bed without using bedrails?: A Little Help needed moving to and from a bed to a chair (including a wheelchair)?: A Little Help needed standing up from a chair using your arms (e.g., wheelchair or bedside chair)?: A Little Help needed to walk in hospital room?: A Little Help needed climbing 3-5 steps with a railing? : A Lot 6 Click Score: 17    End of Session Equipment Utilized During Treatment: Gait belt Activity Tolerance: Patient tolerated treatment well Patient left: in chair;with chair alarm set;with call bell/phone within reach;with family/visitor present Nurse Communication: Mobility status PT Visit Diagnosis: Unsteadiness on feet (R26.81);Other abnormalities of gait and mobility (R26.89);Pain Pain - Right/Left: Left Pain - part of body: Hip     Time: 5993-5701 PT Time Calculation (min) (ACUTE ONLY): 35 min  Charges:  $Gait Training: 8-22 mins $Therapeutic Activity: 8-22 mins                     Roney Marion, PT  Acute Rehabilitation Services Pager [phone] Office [phone]    Colletta [place] 12/06/2020, 7:04 PM

## 2020-12-06 NOTE — Plan of Care (Signed)
  Problem: Health Behavior/Discharge Planning: Goal: Ability to manage health-related needs will improve Outcome: Progressing   Problem: Clinical Measurements: Goal: Ability to maintain clinical measurements within normal limits will improve Outcome: Progressing Goal: Respiratory complications will improve Outcome: Progressing   Problem: Activity: Goal: Risk for activity intolerance will decrease Outcome: Progressing   

## 2020-12-07 DIAGNOSIS — E871 Hypo-osmolality and hyponatremia: Secondary | ICD-10-CM | POA: Diagnosis not present

## 2020-12-07 DIAGNOSIS — S72002A Fracture of unspecified part of neck of left femur, initial encounter for closed fracture: Secondary | ICD-10-CM | POA: Diagnosis not present

## 2020-12-07 DIAGNOSIS — I10 Essential (primary) hypertension: Secondary | ICD-10-CM | POA: Diagnosis not present

## 2020-12-07 LAB — CBC
HCT: 34 % — ABNORMAL LOW (ref 39.0–52.0)
Hemoglobin: 11.7 g/dL — ABNORMAL LOW (ref 13.0–17.0)
MCH: 29.5 pg (ref 26.0–34.0)
MCHC: 34.4 g/dL (ref 30.0–36.0)
MCV: 85.6 fL (ref 80.0–100.0)
Platelets: 211 10*3/uL (ref 150–400)
RBC: 3.97 MIL/uL — ABNORMAL LOW (ref 4.22–5.81)
RDW: 13.5 % (ref 11.5–15.5)
WBC: 6.9 10*3/uL (ref 4.0–10.5)
nRBC: 0 % (ref 0.0–0.2)

## 2020-12-07 LAB — BASIC METABOLIC PANEL
Anion gap: 8 (ref 5–15)
BUN: 20 mg/dL (ref 8–23)
CO2: 24 mmol/L (ref 22–32)
Calcium: 9 mg/dL (ref 8.9–10.3)
Chloride: 96 mmol/L — ABNORMAL LOW (ref 98–111)
Creatinine, Ser: 0.99 mg/dL (ref 0.61–1.24)
GFR, Estimated: >60 mL/min (ref >60)
Glucose, Bld: 100 mg/dL — ABNORMAL HIGH (ref 70–99)
Potassium: 3.6 mmol/L (ref 3.5–5.1)
Sodium: 128 mmol/L — ABNORMAL LOW (ref 135–145)

## 2020-12-07 LAB — VITAMIN D 25 HYDROXY (VIT D DEFICIENCY, FRACTURES): Vit D, 25-Hydroxy: 24.52 ng/mL — ABNORMAL LOW (ref 30–100)

## 2020-12-07 LAB — SARS CORONAVIRUS 2 (TAT 6-24 HRS): SARS Coronavirus 2: NEGATIVE

## 2020-12-07 MED ORDER — ALBUTEROL SULFATE (2.5 MG/3ML) 0.083% IN NEBU: 2.5000 mg | INHALATION_SOLUTION | RESPIRATORY_TRACT | 12 refills | Status: DC | PRN

## 2020-12-07 MED ORDER — ENSURE ENLIVE PO LIQD: 237.0000 mL | Freq: Two times a day (BID) | ORAL | 12 refills | Status: DC

## 2020-12-07 MED ORDER — COVID-19 MRNA VAC-TRIS(PFIZER) 30 MCG/0.3ML IM SUSP
0.3000 mL | Freq: Once | INTRAMUSCULAR | Status: AC
  Administered 2020-12-07: 0.3 mL via INTRAMUSCULAR
  Filled 2020-12-07: qty 0.3

## 2020-12-07 MED ORDER — NICOTINE 7 MG/24HR TD PT24: 7.0000 mg | MEDICATED_PATCH | Freq: Every day | TRANSDERMAL | 0 refills | Status: DC

## 2020-12-07 MED ORDER — ACETAMINOPHEN 325 MG PO TABS: 650.0000 mg | ORAL_TABLET | Freq: Four times a day (QID) | ORAL | 0 refills | Status: AC | PRN

## 2020-12-07 MED ORDER — VITAMIN D3 25 MCG PO TABS: 2000.0000 [IU] | ORAL_TABLET | Freq: Two times a day (BID) | ORAL | Status: DC

## 2020-12-07 MED ORDER — ASCORBIC ACID 500 MG PO TABS: 500.0000 mg | ORAL_TABLET | Freq: Every day | ORAL | Status: DC

## 2020-12-07 MED ORDER — SALINE SPRAY 0.65 % NA SOLN: 1.0000 | NASAL | 0 refills | Status: DC | PRN

## 2020-12-07 MED ORDER — ACETAMINOPHEN 325 MG PO TABS: 650.0000 mg | ORAL_TABLET | Freq: Four times a day (QID) | ORAL | 0 refills | Status: DC | PRN

## 2020-12-07 MED ORDER — ENOXAPARIN SODIUM 40 MG/0.4ML ~~LOC~~ SOLN: 40.0000 mg | SUBCUTANEOUS | 0 refills | Status: DC

## 2020-12-07 MED ORDER — DOCUSATE SODIUM 100 MG PO CAPS: 100.0000 mg | ORAL_CAPSULE | Freq: Two times a day (BID) | ORAL | 0 refills | Status: DC

## 2020-12-07 MED ORDER — LISINOPRIL 20 MG PO TABS: 20.0000 mg | ORAL_TABLET | Freq: Every day | ORAL | Status: DC

## 2020-12-07 NOTE — Discharge Summary (Signed)
Physician Discharge Summary  DEMAREE LIBERTO TMH:[id number] DOB: [birth year] DOA: 12/03/2020  PCP: Saralyn Pilar, MD  Admit date: 12/03/2020 Discharge date: 12/07/2020  Admitted From: home Discharge disposition: SNF   Recommendations for Outpatient Follow-Up:   1. BMP 1 week 2. Fluid restrict 1500 cc/day 3. Orthopedic follow up Lovenox 40 mg sq daily x 4 weeks per ortho   Discharge Diagnosis:   Principal Problem:   Closed left hip fracture (HCC) Active Problems:   Hyponatremia   Essential hypertension   Hip fracture ([place])    Discharge Condition: Improved.  Diet recommendation:  Regular.  Wound care: None.  Code status: Full.   History of Present Illness:   Sergio Chavez is a 80 y.o. male with history of hypertension, hyperlipidemia, tobacco abuse had a fall at home while working at home.  Denies hitting his head or losing consciousness.  Was brought to the ER admits in [facility] due to pain in the left hip area.  ED Course: In the ER x-rays revealed left hip fracture and Dr. Mardelle Matte on-call orthopedic surgery was consulted.  Labs show sodium of around 129 and patient has chronic hyponatremia when compared to labs in [place] in September sodium was around 130.  Mild leukocytosis Covid test negative EKG shows nonspecific changes with chest x-ray showing nothing acute.   Hospital Course by Problem:   Left hip fracture status post mechanical fall.  -s/p surgery 2/26 -ortho consult appreciated -PT/OT eval- SNF -has strong family support for return home after rehab -replace vitamin D  Hyponatremia appears to be chronic. In September 2021 patient sodium was around 130.  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  Tobacco abuse -encouraged cessation      Medical Consultants:   ortho   Discharge Exam:   Vitals:   12/07/20  0251 12/07/20 0755  BP: (!) 142/72 (!) 141/76  Pulse: 87 75  Resp: 17 17  Temp: 97.8 F (36.6 C) 97.8 F (36.6 C)  SpO2: 91% 93%   Vitals:   12/06/20 1608 12/06/20 2022 12/07/20 0251 12/07/20 0755  BP: 94/64 125/74 (!) 142/72 (!) 141/76  Pulse: 88 76 87 75  Resp: 18 18 17 17   Temp: 98.2 F (36.8 C) 98.2 F (36.8 C) 97.8 F (36.6 C) 97.8 F (36.6 C)  TempSrc: Oral Oral Oral Oral  SpO2: 94% 97% 91% 93%  Weight:      Height:        General exam: Appears calm and comfortable.    The results of significant diagnostics from this hospitalization (including imaging, microbiology, ancillary and laboratory) are listed below for reference.     Procedures and Diagnostic Studies:   DG Chest 1 View  Result Date: 12/03/2020 CLINICAL DATA:  Fall today. EXAM: CHEST  1 VIEW COMPARISON:  January 23, 2011. FINDINGS: The heart size and mediastinal contours are within normal limits. Both lungs are clear. No pneumothorax or pleural effusion is noted. Hyperexpansion of the lungs is noted. The visualized skeletal structures are unremarkable. IMPRESSION: No active disease. Aortic Atherosclerosis (ICD10-I70.0). Electronically Signed   By: Marijo Conception M.D.   On: 12/03/2020 19:40   DG Knee Left Port  Result Date: 12/04/2020 CLINICAL DATA:  Left knee pain after fall yesterday. EXAM: PORTABLE LEFT KNEE - 1-2 VIEW COMPARISON:  None. FINDINGS: Diffuse decreased bone mineralization. No evidence of acute fracture or dislocation.  No significant joint effusion. Calcified plaque over the femoral, popliteal and runoff vessels. IMPRESSION: No acute findings. Electronically Signed   By: Marin Olp M.D.   On: 12/04/2020 10:17   DG HIP UNILAT WITH PELVIS 2-3 VIEWS LEFT  Result Date: 12/04/2020 CLINICAL DATA:  Status post left hemiarthroplasty. EXAM: DG HIP (WITH OR WITHOUT PELVIS) 2-3V LEFT COMPARISON:  None. FINDINGS: Left hip prosthesis appears to be well situated. Expected postoperative changes are seen in  the surrounding soft tissues. IMPRESSION: Status post left hip arthroplasty. Electronically Signed   By: Marijo Conception M.D.   On: 12/04/2020 15:12   DG Hip Unilat With Pelvis 2-3 Views Left  Result Date: 12/03/2020 CLINICAL DATA:  Left hip pain after fall. EXAM: DG HIP (WITH OR WITHOUT PELVIS) 2-3V LEFT COMPARISON:  None. FINDINGS: Moderately angulated fracture is seen involving the proximal left femoral neck. Right hip is unremarkable. IMPRESSION: Moderately angulated proximal left femoral neck fracture. Electronically Signed   By: Marijo Conception M.D.   On: 12/03/2020 19:38     Labs:   Basic Metabolic Panel: Recent Labs  Lab 12/04/20 0331 12/04/20 0627 12/05/20 0118 12/06/20 0131 12/07/20 0159  NA 131* 130* 129* 127* 128*  K 4.7 4.1 4.1 3.8 3.6  CL 95* 95* 94* 93* 96*  CO2 26 26 23  21* 24  GLUCOSE 123* 111* 118* 103* 100*  BUN 18 18 22 22 20   CREATININE 1.11 1.06 1.05 0.92 0.99  CALCIUM 9.9 9.6 9.3 9.1 9.0   GFR Estimated Creatinine Clearance: 53.5 mL/min (by C-G formula based on SCr of 0.99 mg/dL). Liver Function Tests: Recent Labs  Lab 12/04/20 0331  AST 20  ALT 12  ALKPHOS 68  BILITOT 1.0  PROT 6.2*  ALBUMIN 3.7   No results for input(s): LIPASE, AMYLASE in the last 168 hours. No results for input(s): AMMONIA in the last 168 hours. Coagulation profile Recent Labs  Lab 12/03/20 1909  INR 1.0    CBC: Recent Labs  Lab 12/03/20 1909 12/04/20 0331 12/05/20 0118 12/06/20 0131 12/07/20 0159  WBC 11.6* 10.1 9.2 7.5 6.9  NEUTROABS 10.0* 8.5*  --   --   --   HGB 14.5 13.9 12.2* 11.4* 11.7*  HCT 42.5 39.3 34.6* 32.0* 34.0*  MCV 86.2 85.2 85.9 85.1 85.6  PLT 284 259 210 184 211   Cardiac Enzymes: No results for input(s): CKTOTAL, CKMB, CKMBINDEX, TROPONINI in the last 168 hours. BNP: Invalid input(s): POCBNP CBG: No results for input(s): GLUCAP in the last 168 hours. D-Dimer No results for input(s): DDIMER in the last 72 hours. Hgb A1c No results  for input(s): HGBA1C in the last 72 hours. Lipid Profile No results for input(s): CHOL, HDL, LDLCALC, TRIG, CHOLHDL, LDLDIRECT in the last 72 hours. Thyroid function studies No results for input(s): TSH, T4TOTAL, T3FREE, THYROIDAB in the last 72 hours.  Invalid input(s): FREET3 Anemia work up No results for input(s): VITAMINB12, FOLATE, FERRITIN, TIBC, IRON, RETICCTPCT in the last 72 hours. Microbiology Recent Results (from the past 240 hour(s))  Resp Panel by RT-PCR (Flu A&B, Covid) Nasopharyngeal Swab     Status: None   Collection Time: 12/03/20  8:10 PM   Specimen: Nasopharyngeal Swab; Nasopharyngeal(NP) swabs in vial transport medium  Result Value Ref Range Status   SARS Coronavirus 2 by RT PCR NEGATIVE NEGATIVE Final    Comment: (NOTE) SARS-CoV-2 target nucleic acids are NOT DETECTED.  The SARS-CoV-2 RNA is generally detectable in upper respiratory specimens during the acute phase  of infection. The lowest concentration of SARS-CoV-2 viral copies this assay can detect is 138 copies/mL. A negative result does not preclude SARS-Cov-2 infection and should not be used as the sole basis for treatment or other patient management decisions. A negative result may occur with  improper specimen collection/handling, submission of specimen other than nasopharyngeal swab, presence of viral mutation(s) within the areas targeted by this assay, and inadequate number of viral copies(<138 copies/mL). A negative result must be combined with clinical observations, patient history, and epidemiological information. The expected result is Negative.  Fact Sheet for Patients:  EntrepreneurPulse.com.au  Fact Sheet for Healthcare Providers:  IncredibleEmployment.be  This test is no t yet approved or cleared by the [place] FDA and  has been authorized for detection and/or diagnosis of SARS-CoV-2 by FDA under an Emergency Use Authorization (EUA). This EUA will  remain  in effect (meaning this test can be used) for the duration of the COVID-19 declaration under Section 564(b)(1) of the Act, 21 U.S.C.section 360bbb-3(b)(1), unless the authorization is terminated  or revoked sooner.       Influenza A by PCR NEGATIVE NEGATIVE Final   Influenza B by PCR NEGATIVE NEGATIVE Final    Comment: (NOTE) The Xpert Xpress SARS-CoV-2/FLU/RSV plus assay is intended as an aid in the diagnosis of influenza from Nasopharyngeal swab specimens and should not be used as a sole basis for treatment. Nasal washings and aspirates are unacceptable for Xpert Xpress SARS-CoV-2/FLU/RSV testing.  Fact Sheet for Patients: EntrepreneurPulse.com.au  Fact Sheet for Healthcare Providers: IncredibleEmployment.be  This test is not yet approved or cleared by the [place] FDA and has been authorized for detection and/or diagnosis of SARS-CoV-2 by FDA under an Emergency Use Authorization (EUA). This EUA will remain in effect (meaning this test can be used) for the duration of the COVID-19 declaration under Section 564(b)(1) of the Act, 21 U.S.C. section 360bbb-3(b)(1), unless the authorization is terminated or revoked.  Performed at [facility], [address], [place], [place] [address]      Discharge Instructions:   Discharge Instructions    Diet general   Complete by: As directed    Discharge wound care:   Complete by: As directed    Reinforce dressing   Increase activity slowly   Complete by: As directed      Allergies as of 12/07/2020   No Known Allergies     Medication List    STOP taking these medications   amLODipine 10 MG tablet Commonly known as: NORVASC   lisinopril-hydrochlorothiazide 20-25 MG tablet Commonly known as: ZESTORETIC     TAKE these medications   acetaminophen 325 MG tablet Commonly known as: TYLENOL Take 2 tablets (650 mg total) by mouth every 6 (six) hours as needed for mild  pain or moderate pain. What changed:   medication strength  how much to take  reasons to take this   albuterol (2.5 MG/3ML) 0.083% nebulizer solution Commonly known as: PROVENTIL Take 3 mLs (2.5 mg total) by nebulization every 2 (two) hours as needed for wheezing or shortness of breath.   ascorbic acid 500 MG tablet Commonly known as: VITAMIN C Take 1 tablet (500 mg total) by mouth daily.   docusate sodium 100 MG capsule Commonly known as: COLACE Take 1 capsule (100 mg total) by mouth 2 (two) times daily.   enoxaparin 40 MG/0.4ML injection Commonly known as: LOVENOX Inject 0.4 mLs (40 mg total) into the skin daily. Start taking on: December 08, 2020  feeding supplement Liqd Take 237 mLs by mouth 2 (two) times daily between meals.   lisinopril 20 MG tablet Commonly known as: ZESTRIL Take 1 tablet (20 mg total) by mouth daily.   nicotine 7 mg/24hr patch Commonly known as: NICODERM CQ - dosed in mg/24 hr Place 1 patch (7 mg total) onto the skin daily.   pravastatin 40 MG tablet Commonly known as: PRAVACHOL Take 40 mg by mouth daily.   sodium chloride 0.65 % Soln nasal spray Commonly known as: OCEAN Place 1 spray into both nostrils as needed for congestion.   Vitamin D3 25 MCG tablet Commonly known as: Vitamin D Take 2 tablets (2,000 Units total) by mouth 2 (two) times daily.            Discharge Care Instructions  (From admission, onward)         Start     Ordered   12/07/20 0000  Discharge wound care:       Comments: Reinforce dressing   12/07/20 1941          Follow-up Information    Alt[place], MD. Schedule an appointment as soon as possible for a visit in 2 week(s).   Specialty: Orthopedic Surgery Contact information: [address] [place] [place] [address] [phone]                Time coordinating discharge: 35 min  Signed:  Geradine Girt DO  [facility] 12/07/2020, 9:05 AM

## 2020-12-07 NOTE — TOC Progression Note (Addendum)
Transition of Care [facility]) - Progression Note    Patient Details  Name: Sergio Chavez MRN: [id number] Date of Birth: 1941/10/09  Transition [facility]) CM/SW [place], [place] Phone Number: 12/07/2020, 10:57 AM  Clinical Narrative:    CSW  Spoke with dtr and pt at bedside and they confirmed they would like [facility]. CSW started insurance auth and left a VM for the facility to confirm. Covid has been placed and family updated that the plan is for him to DC tomorrow. DC packet is on chart. SW will continue to follow for DC needs.  [facility] stated they could take pt, but only if he is fully vaccinated. CSW reached out to Dr. And nurse who confirmed that we are giving boosters. Nurse will follow up w/ family and Dr. Aletha Halim put in an order. CSW will update facility and follow for DC tomorrow.  Pt initially declined the booster shot. CSW explained to pt and dtr that per facility policy, pt's who are not fully vaccinated must go into quarantine for 7 days. This also means that he may not be able to DC tomorrow like they wanted as their might not be any covid beds. Pt and dtr noted understanding and pt agreeable to Booster shot at this time. He states his main goal is to get better and go home, and the booster shot will help him get there. SW will continue to follow for DC planning.    Expected Discharge Plan: [place] Barriers to Discharge: Continued Medical Work up,SNF Pending bed offer,Insurance Authorization  Expected Discharge Plan and Services Expected Discharge Plan: [place] Choice: [place] arrangements for the past 2 months: Single Family Home Expected Discharge Date: 12/07/20                                     Social Determinants of Health (SDOH) Interventions    Readmission Risk Interventions No flowsheet data found.

## 2020-12-07 NOTE — Discharge Instructions (Signed)
Orthopaedic Trauma Service Discharge Instructions   General Discharge Instructions  Orthopaedic Injuries:  Left femoral neck fracture treated with left hip hemiarthroplasty   WEIGHT BEARING STATUS: Weightbearing as tolerated left lower extremity using walker and assistance   RANGE OF MOTION/ACTIVITY: posterior hip precautions left hip   Bone health:  Labs show vitamin d insufficiency. Take supplements that have been prescribed for you.  Recommend bone density scan in the next 4-8 weeks   Wound Care: daily wound care as needed starting on 12/08/2020. See below  Discharge Wound Care Instructions  Do NOT apply any ointments, solutions or lotions to pin sites or surgical wounds.  These prevent needed drainage and even though solutions like hydrogen peroxide kill bacteria, they also damage cells lining the pin sites that help fight infection.  Applying lotions or ointments can keep the wounds moist and can cause them to breakdown and open up as well. This can increase the risk for infection. When in doubt call the office.  Surgical incisions should be dressed daily.  If any drainage is noted, use one layer of adaptic, then gauze and tape. Alternatively you can use a mepilex type dressing (the dressing you have on from the hospital)  Once the incision is completely dry and without drainage, it may be left open to air out.  Showering may begin 36-48 hours later.  Cleaning gently with soap and water.  DVT/PE prophylaxis: Lovenox 40 mg sq injection daily x 4 weeks   Diet: as you were eating previously.  Can use over the counter stool softeners and bowel preparations, such as Miralax, to help with bowel movements.  Narcotics can be constipating.  Be sure to drink plenty of fluids  PAIN MEDICATION USE AND EXPECTATIONS  You have likely been given narcotic medications to help control your pain.  After a traumatic event that results in an fracture (broken bone) with or without surgery, it is ok to  use narcotic pain medications to help control one's pain.  We understand that everyone responds to pain differently and each individual patient will be evaluated on a regular basis for the continued need for narcotic medications. Ideally, narcotic medication use should last no more than 6-8 weeks (coinciding with fracture healing).   As a patient it is your responsibility as well to monitor narcotic medication use and report the amount and frequency you use these medications when you come to your office visit.   We would also advise that if you are using narcotic medications, you should take a dose prior to therapy to maximize you participation.  IF YOU ARE ON NARCOTIC MEDICATIONS IT IS NOT PERMISSIBLE TO OPERATE A MOTOR VEHICLE (MOTORCYCLE/CAR/TRUCK/MOPED) OR HEAVY MACHINERY DO NOT MIX NARCOTICS WITH OTHER CNS (CENTRAL NERVOUS SYSTEM) DEPRESSANTS SUCH AS ALCOHOL   STOP SMOKING OR USING NICOTINE PRODUCTS!!!!  As discussed nicotine severely impairs your body's ability to heal surgical and traumatic wounds but also impairs bone healing.  Wounds and bone heal by forming microscopic blood vessels (angiogenesis) and nicotine is a vasoconstrictor (essentially, shrinks blood vessels).  Therefore, if vasoconstriction occurs to these microscopic blood vessels they essentially disappear and are unable to deliver necessary nutrients to the healing tissue.  This is one modifiable factor that you can do to dramatically increase your chances of healing your injury.    (This means no smoking, no nicotine gum, patches, etc)  DO NOT USE NONSTEROIDAL ANTI-INFLAMMATORY DRUGS (NSAID'S)  Using products such as Advil (ibuprofen), Aleve (naproxen), Motrin (ibuprofen) for additional  pain control during fracture healing can delay and/or prevent the healing response.  If you would like to take over the counter (OTC) medication, Tylenol (acetaminophen) is ok.  However, some narcotic medications that are given for pain control  contain acetaminophen as well. Therefore, you should not exceed more than 4000 mg of tylenol in a day if you do not have liver disease.  Also note that there are may OTC medicines, such as cold medicines and allergy medicines that my contain tylenol as well.  If you have any questions about medications and/or interactions please ask your doctor/PA or your pharmacist.      ICE AND ELEVATE INJURED/OPERATIVE EXTREMITY  Using ice and elevating the injured extremity above your heart can help with swelling and pain control.  Icing in a pulsatile fashion, such as 20 minutes on and 20 minutes off, can be followed.    Do not place ice directly on skin. Make sure there is a barrier between to skin and the ice pack.    Using frozen items such as frozen peas works well as the conform nicely to the are that needs to be iced.  USE AN ACE WRAP OR TED HOSE FOR SWELLING CONTROL  In addition to icing and elevation, Ace wraps or TED hose are used to help limit and resolve swelling.  It is recommended to use Ace wraps or TED hose until you are informed to stop.    When using Ace Wraps start the wrapping distally (farthest away from the body) and wrap proximally (closer to the body)   Example: If you had surgery on your leg or thing and you do not have a splint on, start the ace wrap at the toes and work your way up to the thigh        If you had surgery on your upper extremity and do not have a splint on, start the ace wrap at your fingers and work your way up to the upper arm  IF YOU ARE IN A SPLINT OR CAST DO NOT [place]   If your splint gets wet for any reason please contact the office immediately. You may shower in your splint or cast as long as you keep it dry.  This can be done by wrapping in a cast cover or garbage back (or similar)  Do Not stick any thing down your splint or cast such as pencils, money, or hangers to try and scratch yourself with.  If you feel itchy take benadryl as prescribed  on the bottle for itching  IF YOU ARE IN A CAM BOOT (BLACK BOOT)  You may remove boot periodically. Perform daily dressing changes as noted below.  Wash the liner of the boot regularly and wear a sock when wearing the boot. It is recommended that you sleep in the boot until told otherwise    Call office for the following:  Temperature greater than 101F  Persistent nausea and vomiting  Severe uncontrolled pain  Redness, tenderness, or signs of infection (pain, swelling, redness, odor or green/yellow discharge around the site)  Difficulty breathing, headache or visual disturbances  Hives  Persistent dizziness or light-headedness  Extreme fatigue  Any other questions or concerns you may have after discharge  In an emergency, call 911 or go to an Emergency Department at a nearby hospital  HELPFUL INFORMATION  ? If you had a block, it will wear off between 8-24 hrs postop typically.  This is period when your  pain may go from nearly zero to the pain you would have had postop without the block.  This is an abrupt transition but nothing dangerous is happening.  You may take an extra dose of narcotic when this happens.  ? You should wean off your narcotic medicines as soon as you are able.  Most patients will be off or using minimal narcotics before their first postop appointment.   ? We suggest you use the pain medication the first night prior to going to bed, in order to ease any pain when the anesthesia wears off. You should avoid taking pain medications on an empty stomach as it will make you nauseous.  ? Do not drink alcoholic beverages or take illicit drugs when taking pain medications.  ? In most states it is against the law to drive while you are in a splint or sling.  And certainly against the law to drive while taking narcotics.  ? You may return to work/school in the next couple of days when you feel up to it.   ? Pain medication may make you constipated.  Below are a few  solutions to try in this order: - Decrease the amount of pain medication if you aren't having pain. - Drink lots of decaffeinated fluids. - Drink prune juice and/or each dried prunes  o If the first 3 don't work start with additional solutions - Take Colace - an over-the-counter stool softener - Take Senokot - an over-the-counter laxative - Take Miralax - a stronger over-the-counter laxative     CALL THE OFFICE WITH ANY QUESTIONS OR CONCERNS: [phone]   VISIT OUR WEBSITE FOR ADDITIONAL INFORMATION: [email or web address]

## 2020-12-07 NOTE — Plan of Care (Signed)
  Problem: Health Behavior/Discharge Planning: Goal: Ability to manage health-related needs will improve Outcome: Progressing   Problem: Clinical Measurements: Goal: Ability to maintain clinical measurements within normal limits will improve Outcome: Progressing Goal: Will remain free from infection Outcome: Progressing Goal: Respiratory complications will improve Outcome: Progressing Goal: Cardiovascular complication will be avoided Outcome: Progressing   Problem: Activity: Goal: Risk for activity intolerance will decrease Outcome: Progressing   

## 2020-12-07 NOTE — Progress Notes (Signed)
Appropriate Use Committee Chart Review  Chart reviewed by the physician advisor with input from [facility] and the attending MD as needed for review of the appropriateness for SNF referral.  TOC notes, PT/OT/ST notes, nursing notes and physician notes reviewed for medical necessity to determine if the patient's needs are appropriate for short-term rehab to return to a prior level of function versus the likely need for custodial care.  At this time, the patient meets Medicare criteria for SNF placement.   Recommendations: The patient is SNF appropriate for short-term rehab/skilled nursing interventions.   Jacquelynn Cree, MD Chief Physician Advisor  12/07/2020 11:22 AM

## 2020-12-08 MED ORDER — DICLOFENAC SODIUM 1 % EX GEL: 2.0000 g | Freq: Four times a day (QID) | CUTANEOUS | Status: AC

## 2020-12-08 MED ORDER — DICLOFENAC SODIUM 1 % EX GEL
2.0000 g | Freq: Four times a day (QID) | CUTANEOUS | Status: DC
  Administered 2020-12-08 (×2): 2 g via TOPICAL
  Filled 2020-12-08: qty 100

## 2020-12-08 NOTE — Progress Notes (Signed)
Physical Therapy Treatment Patient Details Name: Sergio Chavez MRN: [id number] DOB: Jun 13, 1941 Today's Date: 12/08/2020    History of Present Illness Sergio Chavez is a 80 y.o. male with history of hypertension, hyperlipidemia, tobacco abuse had a fall at home while working at home; sustained a L hip fracture, now s/p L hip hemiarthroplasty for fixation, Post Hip Prec, WBAT    PT Comments    Continuing work on functional mobility and activity tolerance;  Eager to get on with his rehab, but agreeing to go over [place] therex which he performed well; Reinforced Post Hip Prec with pt and daughter; Overall progressing well; Anticipate continuing good progress at post-acute rehabilitation.    Follow Up Recommendations  SNF;Other (comment) (post-acute rehab)     Equipment Recommendations  Rolling walker with 5" wheels;3in1 (PT)    Recommendations for Other Services       Precautions / Restrictions Precautions Precautions: Posterior Hip;Fall Precaution Booklet Issued: Yes (comment) Precaution Comments: reviewed posterior hip precautions with pt and his daughter Restrictions Weight Bearing Restrictions: Yes LLE Weight Bearing: Weight bearing as tolerated Other Position/Activity Restrictions: LLE WBAT    Mobility  Bed Mobility               General bed mobility comments: pt sitting EOB upon arrival    Transfers Overall transfer level: Needs assistance Equipment used: Rolling walker (2 wheeled) Transfers: Sit to/from Stand Sit to Stand: Min guard         General transfer comment: cues for safe hand placement using RW  Ambulation/Gait                 Stairs             Wheelchair Mobility    Modified Rankin (Stroke Patients Only)       Balance   Sitting-balance support: Feet supported Sitting balance-Leahy Scale: Good     Standing balance support: Bilateral upper extremity supported;During functional activity Standing balance-Leahy Scale:  Poor                              Cognition Arousal/Alertness: Awake/alert Behavior During Therapy: WFL for tasks assessed/performed Overall Cognitive Status: Impaired/Different from baseline Area of Impairment: Safety/judgement                         Safety/Judgement: Decreased awareness of safety;Decreased awareness of deficits     General Comments: Eager to get on with his rehab      Exercises Total Joint Exercises Ankle Circles/Pumps: AROM;Both;10 reps Quad Sets: AROM;Left;10 reps Gluteal Sets: AROM;Both;10 reps Towel Squeeze: AROM;Both;20 reps Heel Slides: AROM;Left;10 reps Hip ABduction/ADduction: AROM;Left;10 reps Bridges: AROM;Both;10 reps    General Comments        Pertinent Vitals/Pain Pain Assessment: Faces Faces Pain Scale: Hurts a little bit Pain Location: L hip Pain Descriptors / Indicators: Aching;Discomfort Pain Intervention(s): Monitored during session    Home Living                      Prior Function            PT Goals (current goals can now be found in the care plan section) Acute Rehab PT Goals Patient Stated Goal: Wants to be home, but agreed to ST rehab PT Goal Formulation: With patient Time For Goal Achievement: 12/19/20 Potential to Achieve Goals: Good Progress towards PT goals: Progressing toward goals  Frequency    Min 3X/week      PT Plan Current plan remains appropriate    Co-evaluation              AM-PAC PT "6 Clicks" Mobility   Outcome Measure  Help needed turning from your back to your side while in a flat bed without using bedrails?: A Little Help needed moving from lying on your back to sitting on the side of a flat bed without using bedrails?: A Little Help needed moving to and from a bed to a chair (including a wheelchair)?: A Little Help needed standing up from a chair using your arms (e.g., wheelchair or bedside chair)?: A Little Help needed to walk in hospital room?:  A Little Help needed climbing 3-5 steps with a railing? : A Lot 6 Click Score: 17    End of Session   Activity Tolerance: Patient tolerated treatment well Patient left: in bed;with call bell/phone within reach;with family/visitor present Nurse Communication: Mobility status PT Visit Diagnosis: Unsteadiness on feet (R26.81);Other abnormalities of gait and mobility (R26.89);Pain Pain - Right/Left: Left Pain - part of body: Hip     Time: 2423-5361 PT Time Calculation (min) (ACUTE ONLY): 19 min  Charges:  $Therapeutic Exercise: 8-22 mins                     Roney Marion, PT  Acute Rehabilitation Services Pager [phone] Office [phone]    Colletta [place] 12/08/2020, 2:33 PM

## 2020-12-08 NOTE — Progress Notes (Signed)
Occupational Therapy Treatment Patient Details Name: Sergio Chavez MRN: [id number] DOB: 03/27/1941 Today's Date: 12/08/2020    History of present illness Sergio Chavez is a 80 y.o. male with history of hypertension, hyperlipidemia, tobacco abuse had a fall at home while working at home; sustained a L hip fracture, now s/p L hip hemiarthroplasty for fixation, Post Hip Prec, WBAT   OT comments  Pt making good progress with functional goals; eager to d/c to SNF for ST rehab. OT will continue to follow acutely to maximize level of function and safety  Follow Up Recommendations  SNF;Supervision/Assistance - 24 hour    Equipment Recommendations  3 in 1 bedside commode;Other (comment) (RW, TBD at [facility])    Recommendations for Other Services      Precautions / Restrictions Precautions Precautions: Posterior Hip;Fall Precaution Comments: reviewed posterior hip precautions with pt and his daughter Restrictions Weight Bearing Restrictions: Yes LLE Weight Bearing: Weight bearing as tolerated Other Position/Activity Restrictions: LLE WBAT       Mobility Bed Mobility               General bed mobility comments: pt sitting EOB upon arrival    Transfers Overall transfer level: Needs assistance Equipment used: Rolling walker (2 wheeled) Transfers: Sit to/from Stand Sit to Stand: Min guard         General transfer comment: cues for safe hand placement using RW    Balance   Sitting-balance support: Feet supported Sitting balance-Leahy Scale: Good     Standing balance support: Bilateral upper extremity supported;During functional activity Standing balance-Leahy Scale: Poor                             ADL either performed or assessed with clinical judgement   ADL Overall ADL's : Needs assistance/impaired     Grooming: Wash/dry hands;Wash/dry face;Min guard;Standing       Lower Body Bathing: Minimal assistance;Moderate assistance;Sitting/lateral leans;Sit  to/from stand Lower Body Bathing Details (indicate cue type and reason): simulated         Toilet Transfer: Min guard;Adhering to hip precautions;Cueing for safety;Ambulation;RW;Comfort height toilet;Grab bars   Toileting- Clothing Manipulation and Hygiene: Minimal assistance;Cueing for safety;Sitting/lateral lean;Sit to/from stand       Functional mobility during ADLs: Min guard;Cueing for safety;Rolling walker       Vision Baseline Vision/History: Wears glasses Wears Glasses: At all times Patient Visual Report: No change from baseline     Perception     Praxis      Cognition Arousal/Alertness: Awake/alert Behavior During Therapy: WFL for tasks assessed/performed Overall Cognitive Status: Impaired/Different from baseline Area of Impairment: Safety/judgement                         Safety/Judgement: Decreased awareness of safety;Decreased awareness of deficits              Exercises     Shoulder Instructions       General Comments      Pertinent Vitals/ Pain       Pain Assessment: Faces Faces Pain Scale: Hurts a little bit Pain Location: L hip Pain Descriptors / Indicators: Aching;Discomfort Pain Intervention(s): Monitored during session;Repositioned  Home Living  Prior Functioning/Environment              Frequency  Min 2X/week        Progress Toward Goals  OT Goals(current goals can now be found in the care plan section)  Progress towards OT goals: Progressing toward goals  Acute Rehab OT Goals Patient Stated Goal: Wants to be home, but agreed to [place] rehab  Plan Discharge plan remains appropriate    Co-evaluation                 AM-PAC OT "6 Clicks" Daily Activity     Outcome Measure   Help from another person eating meals?: None Help from another person taking care of personal grooming?: A Little Help from another person toileting, which includes using  toliet, bedpan, or urinal?: A Little Help from another person bathing (including washing, rinsing, drying)?: A Lot Help from another person to put on and taking off regular upper body clothing?: None Help from another person to put on and taking off regular lower body clothing?: A Lot 6 Click Score: 18    End of Session Equipment Utilized During Treatment: Gait belt;Rolling walker;Other (comment) (3 in 1 over toilet)  OT Visit Diagnosis: Unsteadiness on feet (R26.81);Muscle weakness (generalized) (M62.81);Pain Pain - Right/Left: Left Pain - part of body: Leg   Activity Tolerance Patient tolerated treatment well   Patient Left in bed;with call bell/phone within reach;with family/visitor present;Other (comment) (sitting EOB)   Nurse Communication          Time: 6720-9470 OT Time Calculation (min): 27 min  Charges: OT General Charges $OT Visit: 1 Visit OT Treatments $Self Care/Home Management : 8-22 mins $Therapeutic Activity: 8-22 mins     Britt Bottom 12/08/2020, 2:28 PM

## 2020-12-08 NOTE — Progress Notes (Signed)
Pt is A&O x3, left hip dressing dry and intact. Pain is controlled. Report was given to [facility] at [facility]. 1600 Discharged pt to SNF via [place], pt's daughter is present upon discharge.

## 2020-12-08 NOTE — Care Management Important Message (Signed)
Important Message  Patient Details  Name: Sergio Chavez MRN: [id number] Date of Birth: 08-21-1941   Medicare Important Message Given:  Yes     Orbie Pyo 12/08/2020, 3:38 PM

## 2020-12-08 NOTE — TOC Transition Note (Signed)
Transition of Care [facility]) - CM/SW Discharge Note   Patient Details  Name: Sergio Chavez MRN: [id number] Date of Birth: Dec 18, 1940  Transition of Care [facility]) CM/SW Contact:  Amador Cunas, [place] Phone Number: 12/08/2020, 10:22 AM   Clinical Narrative: Pt for dc to [facility] today. Spoke to [facility] in admissions who confirmed they are prepared to admit pt to room 501 today. Pt's dtr aware of dc and has arranged to complete admission paperwork this morning. PTAR arranged for 1230 and RN provided with number for report. SW signing off at dc.   Wandra Feinstein, MSW, LCSW [phone] (coverage)         Final next level of care: Skilled Nursing Facility Barriers to Discharge: No Barriers Identified   Patient Goals and CMS Choice Patient states their goals for this hospitalization and ongoing recovery are:: Pt states his main goal is to return home. CMS Medicare.gov Compare Post Acute Care list provided to:: Patient Represenative (must comment) (Daughter) Choice offered to / list presented to : Adult Children  Discharge Placement              Patient chooses bed at: [facility] Patient to be transferred to facility by: [place] Name of family member notified: Neoma Laming Patient and family notified of of transfer: 12/08/20  Discharge Plan and Services     Post Acute Care Choice: [place]                               Social Determinants of Health (SDOH) Interventions     Readmission Risk Interventions No flowsheet data found.

## 2020-12-08 NOTE — Discharge Summary (Signed)
Physician Discharge Summary  Sergio Chavez KCL:[id number] DOB: 1941-05-30 DOA: 12/03/2020  PCP: Saralyn Pilar, MD  Admit date: 12/03/2020 Discharge date: 12/08/2020  Admitted From: home Discharge disposition: SNF   Recommendations for Outpatient Follow-Up:   1. BMP 1 week 2. Fluid restrict 1500 cc/day 3. Orthopedic follow up Lovenox 40 mg sq daily x 4 weeks per ortho Flutter valve/incentive spirometry   Discharge Diagnosis:   Principal Problem:   Closed left hip fracture (HCC) Active Problems:   Hyponatremia   Essential hypertension   Hip fracture ([place])    Discharge Condition: Improved.  Diet recommendation:  Regular.  Wound care: None.  Code status: Full.   History of Present Illness:   Sergio Chavez is a 80 y.o. male with history of hypertension, hyperlipidemia, tobacco abuse had a fall at home while working at home.  Denies hitting his head or losing consciousness.  Was brought to the ER admits in [facility] due to pain in the left hip area.  ED Course: In the ER x-rays revealed left hip fracture and Dr. Mardelle Matte on-call orthopedic surgery was consulted.  Labs show sodium of around 129 and patient has chronic hyponatremia when compared to labs in [place] in September sodium was around 130.  Mild leukocytosis Covid test negative EKG shows nonspecific changes with chest x-ray showing nothing acute.   Hospital Course by Problem:   Left hip fracture status post mechanical fall.  -s/p surgery 2/26 -ortho consult appreciated -PT/OT eval- SNF -has strong family support for return home after rehab -replace vitamin D  Hyponatremia appears to be chronic. In September 2021 patient sodium was around 130.  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  Tobacco abuse -encouraged cessation      Medical Consultants:    ortho   Discharge Exam:   Vitals:   12/07/20 2014 12/08/20 0740  BP: (!) 147/80 130/80  Pulse: 73 71  Resp:  17  Temp: (!) 97.5 F (36.4 C) 98.2 F (36.8 C)  SpO2: 94% 92%   Vitals:   12/07/20 0755 12/07/20 1647 12/07/20 2014 12/08/20 0740  BP: (!) 141/76 109/86 (!) 147/80 130/80  Pulse: 75 77 73 71  Resp: 17 17  17   Temp: 97.8 F (36.6 C) 98.3 F (36.8 C) (!) 97.5 F (36.4 C) 98.2 F (36.8 C)  TempSrc: Oral Oral Oral Oral  SpO2: 93% 96% 94% 92%  Weight:      Height:        General exam: Appears calm and comfortable.    The results of significant diagnostics from this hospitalization (including imaging, microbiology, ancillary and laboratory) are listed below for reference.     Procedures and Diagnostic Studies:   DG Chest 1 View  Result Date: 12/03/2020 CLINICAL DATA:  Fall today. EXAM: CHEST  1 VIEW COMPARISON:  January 23, 2011. FINDINGS: The heart size and mediastinal contours are within normal limits. Both lungs are clear. No pneumothorax or pleural effusion is noted. Hyperexpansion of the lungs is noted. The visualized skeletal structures are unremarkable. IMPRESSION: No active disease. Aortic Atherosclerosis (ICD10-I70.0). Electronically Signed   By: Marijo Conception M.D.   On: 12/03/2020 19:40   DG Knee Left Port  Result Date: 12/04/2020 CLINICAL DATA:  Left knee pain after fall yesterday. EXAM: PORTABLE LEFT KNEE - 1-2 VIEW COMPARISON:  None. FINDINGS: Diffuse decreased bone mineralization. No evidence of  acute fracture or dislocation. No significant joint effusion. Calcified plaque over the femoral, popliteal and runoff vessels. IMPRESSION: No acute findings. Electronically Signed   By: Marin Olp M.D.   On: 12/04/2020 10:17   DG HIP UNILAT WITH PELVIS 2-3 VIEWS LEFT  Result Date: 12/04/2020 CLINICAL DATA:  Status post left hemiarthroplasty. EXAM: DG HIP (WITH OR WITHOUT PELVIS) 2-3V LEFT COMPARISON:  None. FINDINGS: Left hip prosthesis appears to be  well situated. Expected postoperative changes are seen in the surrounding soft tissues. IMPRESSION: Status post left hip arthroplasty. Electronically Signed   By: Marijo Conception M.D.   On: 12/04/2020 15:12   DG Hip Unilat With Pelvis 2-3 Views Left  Result Date: 12/03/2020 CLINICAL DATA:  Left hip pain after fall. EXAM: DG HIP (WITH OR WITHOUT PELVIS) 2-3V LEFT COMPARISON:  None. FINDINGS: Moderately angulated fracture is seen involving the proximal left femoral neck. Right hip is unremarkable. IMPRESSION: Moderately angulated proximal left femoral neck fracture. Electronically Signed   By: Marijo Conception M.D.   On: 12/03/2020 19:38     Labs:   Basic Metabolic Panel: Recent Labs  Lab 12/04/20 0331 12/04/20 0627 12/05/20 0118 12/06/20 0131 12/07/20 0159  NA 131* 130* 129* 127* 128*  K 4.7 4.1 4.1 3.8 3.6  CL 95* 95* 94* 93* 96*  CO2 26 26 23  21* 24  GLUCOSE 123* 111* 118* 103* 100*  BUN 18 18 22 22 20   CREATININE 1.11 1.06 1.05 0.92 0.99  CALCIUM 9.9 9.6 9.3 9.1 9.0   GFR Estimated Creatinine Clearance: 53.5 mL/min (by C-G formula based on SCr of 0.99 mg/dL). Liver Function Tests: Recent Labs  Lab 12/04/20 0331  AST 20  ALT 12  ALKPHOS 68  BILITOT 1.0  PROT 6.2*  ALBUMIN 3.7   No results for input(s): LIPASE, AMYLASE in the last 168 hours. No results for input(s): AMMONIA in the last 168 hours. Coagulation profile Recent Labs  Lab 12/03/20 1909  INR 1.0    CBC: Recent Labs  Lab 12/03/20 1909 12/04/20 0331 12/05/20 0118 12/06/20 0131 12/07/20 0159  WBC 11.6* 10.1 9.2 7.5 6.9  NEUTROABS 10.0* 8.5*  --   --   --   HGB 14.5 13.9 12.2* 11.4* 11.7*  HCT 42.5 39.3 34.6* 32.0* 34.0*  MCV 86.2 85.2 85.9 85.1 85.6  PLT 284 259 210 184 211   Cardiac Enzymes: No results for input(s): CKTOTAL, CKMB, CKMBINDEX, TROPONINI in the last 168 hours. BNP: Invalid input(s): POCBNP CBG: No results for input(s): GLUCAP in the last 168 hours. D-Dimer No results for  input(s): DDIMER in the last 72 hours. Hgb A1c No results for input(s): HGBA1C in the last 72 hours. Lipid Profile No results for input(s): CHOL, HDL, LDLCALC, TRIG, CHOLHDL, LDLDIRECT in the last 72 hours. Thyroid function studies No results for input(s): TSH, T4TOTAL, T3FREE, THYROIDAB in the last 72 hours.  Invalid input(s): FREET3 Anemia work up No results for input(s): VITAMINB12, FOLATE, FERRITIN, TIBC, IRON, RETICCTPCT in the last 72 hours. Microbiology Recent Results (from the past 240 hour(s))  Resp Panel by RT-PCR (Flu A&B, Covid) Nasopharyngeal Swab     Status: None   Collection Time: 12/03/20  8:10 PM   Specimen: Nasopharyngeal Swab; Nasopharyngeal(NP) swabs in vial transport medium  Result Value Ref Range Status   SARS Coronavirus 2 by RT PCR NEGATIVE NEGATIVE Final    Comment: (NOTE) SARS-CoV-2 target nucleic acids are NOT DETECTED.  The SARS-CoV-2 RNA is generally detectable in upper respiratory specimens  during the acute phase of infection. The lowest concentration of SARS-CoV-2 viral copies this assay can detect is 138 copies/mL. A negative result does not preclude SARS-Cov-2 infection and should not be used as the sole basis for treatment or other patient management decisions. A negative result may occur with  improper specimen collection/handling, submission of specimen other than nasopharyngeal swab, presence of viral mutation(s) within the areas targeted by this assay, and inadequate number of viral copies(<138 copies/mL). A negative result must be combined with clinical observations, patient history, and epidemiological information. The expected result is Negative.  Fact Sheet for Patients:  EntrepreneurPulse.com.au  Fact Sheet for Healthcare Providers:  IncredibleEmployment.be  This test is no t yet approved or cleared by the [place] FDA and  has been authorized for detection and/or diagnosis of SARS-CoV-2  by FDA under an Emergency Use Authorization (EUA). This EUA will remain  in effect (meaning this test can be used) for the duration of the COVID-19 declaration under Section 564(b)(1) of the Act, 21 U.S.C.section 360bbb-3(b)(1), unless the authorization is terminated  or revoked sooner.       Influenza A by PCR NEGATIVE NEGATIVE Final   Influenza B by PCR NEGATIVE NEGATIVE Final    Comment: (NOTE) The Xpert Xpress SARS-CoV-2/FLU/RSV plus assay is intended as an aid in the diagnosis of influenza from Nasopharyngeal swab specimens and should not be used as a sole basis for treatment. Nasal washings and aspirates are unacceptable for Xpert Xpress SARS-CoV-2/FLU/RSV testing.  Fact Sheet for Patients: EntrepreneurPulse.com.au  Fact Sheet for Healthcare Providers: IncredibleEmployment.be  This test is not yet approved or cleared by the [place] FDA and has been authorized for detection and/or diagnosis of SARS-CoV-2 by FDA under an Emergency Use Authorization (EUA). This EUA will remain in effect (meaning this test can be used) for the duration of the COVID-19 declaration under Section 564(b)(1) of the Act, 21 U.S.C. section 360bbb-3(b)(1), unless the authorization is terminated or revoked.  Performed at [facility], [address] [place], [place] [address]   SARS CORONAVIRUS 2 (TAT 6-24 HRS) Nasopharyngeal Nasopharyngeal Swab     Status: None   Collection Time: 12/07/20 11:56 AM   Specimen: Nasopharyngeal Swab  Result Value Ref Range Status   SARS Coronavirus 2 NEGATIVE NEGATIVE Final    Comment: (NOTE) SARS-CoV-2 target nucleic acids are NOT DETECTED.  The SARS-CoV-2 RNA is generally detectable in upper and lower respiratory specimens during the acute phase of infection. Negative results do not preclude SARS-CoV-2 infection, do not rule out co-infections with other pathogens, and should not be used as the sole basis for  treatment or other patient management decisions. Negative results must be combined with clinical observations, patient history, and epidemiological information. The expected result is Negative.  Fact Sheet for Patients: SugarRoll.be  Fact Sheet for Healthcare Providers: https://www.woods-mathews.com/  This test is not yet approved or cleared by the [place] FDA and  has been authorized for detection and/or diagnosis of SARS-CoV-2 by FDA under an Emergency Use Authorization (EUA). This EUA will remain  in effect (meaning this test can be used) for the duration of the COVID-19 declaration under Se ction 564(b)(1) of the Act, 21 U.S.C. section 360bbb-3(b)(1), unless the authorization is terminated or revoked sooner.  Performed at [facility], [place] [address], [place], [place] [address]      Discharge Instructions:   Discharge Instructions    Diet general   Complete by: As directed    Discharge  wound care:   Complete by: As directed    Reinforce dressing   Increase activity slowly   Complete by: As directed      Allergies as of 12/08/2020   No Known Allergies     Medication List    STOP taking these medications   amLODipine 10 MG tablet Commonly known as: NORVASC   lisinopril-hydrochlorothiazide 20-25 MG tablet Commonly known as: ZESTORETIC     TAKE these medications   acetaminophen 325 MG tablet Commonly known as: TYLENOL Take 2 tablets (650 mg total) by mouth every 6 (six) hours as needed for mild pain or moderate pain. What changed:   medication strength  how much to take  reasons to take this   albuterol (2.5 MG/3ML) 0.083% nebulizer solution Commonly known as: PROVENTIL Take 3 mLs (2.5 mg total) by nebulization every 2 (two) hours as needed for wheezing or shortness of breath.   ascorbic acid 500 MG tablet Commonly known as: VITAMIN C Take 1 tablet (500 mg total) by mouth daily.   diclofenac  Sodium 1 % Gel Commonly known as: VOLTAREN Apply 2 g topically 4 (four) times daily.   docusate sodium 100 MG capsule Commonly known as: COLACE Take 1 capsule (100 mg total) by mouth 2 (two) times daily.   enoxaparin 40 MG/0.4ML injection Commonly known as: LOVENOX Inject 0.4 mLs (40 mg total) into the skin daily.   feeding supplement Liqd Take 237 mLs by mouth 2 (two) times daily between meals.   lisinopril 20 MG tablet Commonly known as: ZESTRIL Take 1 tablet (20 mg total) by mouth daily.   nicotine 7 mg/24hr patch Commonly known as: NICODERM CQ - dosed in mg/24 hr Place 1 patch (7 mg total) onto the skin daily.   pravastatin 40 MG tablet Commonly known as: PRAVACHOL Take 40 mg by mouth daily.   sodium chloride 0.65 % Soln nasal spray Commonly known as: OCEAN Place 1 spray into both nostrils as needed for congestion.   Vitamin D3 25 MCG tablet Commonly known as: Vitamin D Take 2 tablets (2,000 Units total) by mouth 2 (two) times daily.            Discharge Care Instructions  (From admission, onward)         Start     Ordered   12/07/20 0000  Discharge wound care:       Comments: Reinforce dressing   12/07/20 7939          Contact information for follow-up providers    Altamese Manvel, MD. Schedule an appointment as soon as possible for a visit in 2 week(s).   Specialty: Orthopedic Surgery Contact information: [place] [phone]            Contact information for after-discharge care    Destination    [facility] Preferred SNF .   Service: Skilled Nursing Contact information: [address] [place] [phone]                   Time coordinating discharge: 35 min  Signed:  Geradine Girt DO  [facility] 12/08/2020, 8:34 AM

## 2020-12-09 ENCOUNTER — Non-Acute Institutional Stay (SKILLED_NURSING_FACILITY): Payer: Medicare Other | Admitting: Orthopedic Surgery

## 2020-12-09 ENCOUNTER — Encounter: Payer: Self-pay | Admitting: Orthopedic Surgery

## 2020-12-09 DIAGNOSIS — E782 Mixed hyperlipidemia: Secondary | ICD-10-CM | POA: Diagnosis not present

## 2020-12-09 DIAGNOSIS — Z720 Tobacco use: Secondary | ICD-10-CM

## 2020-12-09 DIAGNOSIS — I10 Essential (primary) hypertension: Secondary | ICD-10-CM

## 2020-12-09 DIAGNOSIS — Z8781 Personal history of (healed) traumatic fracture: Secondary | ICD-10-CM | POA: Diagnosis not present

## 2020-12-09 DIAGNOSIS — Z85819 Personal history of malignant neoplasm of unspecified site of lip, oral cavity, and pharynx: Secondary | ICD-10-CM

## 2020-12-09 DIAGNOSIS — E871 Hypo-osmolality and hyponatremia: Secondary | ICD-10-CM

## 2020-12-09 NOTE — Progress Notes (Signed)
Location:  [place] Room Number: 626/R Place of Service:  SNF [phone]) Provider:  Windell Moulding, AGNP-C  Powers, Charolotte Eke, MD  Patient Care Team: Saralyn Pilar, MD as PCP - General (Family Medicine)  Extended Emergency Contact Information Primary Emergency Contact: Bernard, Slayden Address: [address]          [place], [place] [address] [place] Phone: [phone] Relation: Spouse Hard of hearing? Yes Secondary Emergency Contact: Timmothy Sours Phone: [phone] Relation: Daughter  Goals of care: Advanced Directive information Advanced Directives 12/04/2020  Does Patient Have a Medical Advance Directive? -  Type of Advance Directive [place]  Does patient want to make changes to medical advance directive? No - Patient declined  Would patient like information on creating a medical advance directive? -     Chief Complaint  Patient presents with  . Hospitalization Follow-up    HPI:  Pt is a 80 y.o. male seen today for hospital f/u s/p admission from [facility] 02/25- 03/02.   He currently resides at [facility], seen today at bedside. PMH includes: hypertension and throat cancer.   Prior to hospitalization, he lived at home with his wife. 02/25 he fell at home and had increased left hip pain after. Denied hitting head or losing consciousness. He initially presented to ED in [facility] and was transferred to [facility]. In the ED, initial vitals stable. Sodium 129, history of hyponatremia. Mild leukocytosis noted. Covi-19 negative. EKG with nonspecific changes. Chest x-ray unremarkable. Left hip x-ray with moderately angulated proximal left femoral neck fracture. Left knee x-ray negative for fracture or abnormality. 02/26 he underwent bipolar hemiarthroplasty of the left hip by Dr. Altamese [place]. He tolerated procedure well. Hyponatremia improved during stay. HCTZ  discontinued. Suspected SIADH. Advised fluid restriction of 1500cc/day. He has a long history of smoking. Quit during hospitalization and given nicotine patch. PT recommended SNF at discharge for additional therapy services.   Today, he is sitting in bed, wife and daughters present during encounter. He is alert and oriented x 3. Soft spoken due to past throat cancer. He denies pain in his left hip. Ambulating about 15 ft with walker. Touching assistance with ADL's. Complains of left chest pain and soreness since quitting smoking. Reports he is coughing often with brown sputum production. Refused nicotine patches and mucinex at this time. Competing about 1750 on incentive spirometer. Bowel movement yesterday. Plan to discharge home after stay at [facility], wife will help with care.   Facility nurse does not report any concerns, vitals stable.   Past Medical History:  Diagnosis Date  . Hypertension   . Throat cancer [facility])    Past Surgical History:  Procedure Laterality Date  . BACK SURGERY    . HIP ARTHROPLASTY Left 12/04/2020   Procedure: HIP HEMIARTHROPLASTY;  Surgeon: Altamese Grampian, MD;  Location: [place];  Service: Orthopedics;  Laterality: Left;  . PROSTATE SURGERY      No Known Allergies  Outpatient Encounter Medications as of 12/09/2020  Medication Sig  . acetaminophen (TYLENOL) 325 MG tablet Take 2 tablets (650 mg total) by mouth every 6 (six) hours as needed for mild pain or moderate pain.  Marland Kitchen albuterol (PROVENTIL) (2.5 MG/3ML) 0.083% nebulizer solution Take 3 mLs (2.5 mg total) by nebulization every 2 (two) hours as needed for wheezing or shortness of breath.  Marland Kitchen ascorbic acid (VITAMIN C) 500 MG tablet Take 1 tablet (500  mg total) by mouth daily.  . cholecalciferol (VITAMIN D) 25 MCG tablet Take 2 tablets (2,000 Units total) by mouth 2 (two) times daily.  . diclofenac Sodium (VOLTAREN) 1 % GEL Apply 2 g topically 4 (four) times daily.  Marland Kitchen docusate sodium (COLACE) 100 MG capsule Take 1  capsule (100 mg total) by mouth 2 (two) times daily.  Marland Kitchen enoxaparin (LOVENOX) 40 MG/0.4ML injection Inject 0.4 mLs (40 mg total) into the skin daily.  . feeding supplement (ENSURE ENLIVE / ENSURE PLUS) LIQD Take 237 mLs by mouth 2 (two) times daily between meals.  Marland Kitchen lisinopril (ZESTRIL) 20 MG tablet Take 1 tablet (20 mg total) by mouth daily.  . nicotine (NICODERM CQ - DOSED IN MG/24 HR) 7 mg/24hr patch Place 1 patch (7 mg total) onto the skin daily.  . pravastatin (PRAVACHOL) 40 MG tablet Take 40 mg by mouth daily.  . sodium chloride (OCEAN) 0.65 % SOLN nasal spray Place 1 spray into both nostrils as needed for congestion.   No facility-administered encounter medications on file as of 12/09/2020.    Review of Systems  Constitutional: Negative for activity change, appetite change and fever.  HENT: Positive for trouble swallowing and voice change. Negative for congestion, dental problem and hearing loss.   Eyes: Negative for visual disturbance.       Glasses  Respiratory: Positive for cough. Negative for wheezing.        Sputum production, recent smoking cessation  Cardiovascular: Negative for chest pain and leg swelling.  Gastrointestinal: Negative for abdominal distention, abdominal pain, constipation, diarrhea and nausea.  Genitourinary: Negative for dysuria, frequency and hematuria.  Musculoskeletal: Positive for arthralgias and myalgias.       Left hip pain  Skin:       Surgical incision  Neurological: Positive for weakness. Negative for dizziness and headaches.  Psychiatric/Behavioral: Negative for confusion and dysphoric mood. The patient is not nervous/anxious.     Immunization History  Administered Date(s) Administered  . PFIZER Comirnaty(Gray Top)Covid-19 Tri-Sucrose Vaccine 12/07/2020  . PFIZER(Purple Top)SARS-COV-2 Vaccination 03/18/2020, 04/08/2020   Pertinent  Health Maintenance Due  Topic Date Due  . INFLUENZA VACCINE  05/09/2020  . PNA vac Low Risk Adult  Completed    No flowsheet data found. Functional Status Survey:    Vitals:   12/09/20 1631  BP: 118/79  Pulse: (!) 18  Temp: (!) 97.5 F (36.4 C)  Weight: 140 lb (63.5 kg)   Body mass index is 21.93 kg/m. Physical Exam Vitals reviewed.  Constitutional:      General: He is not in acute distress. HENT:     Head: Normocephalic.     Right Ear: There is no impacted cerumen.     Left Ear: There is no impacted cerumen.     Nose: Nose normal.     Mouth/Throat:     Mouth: Mucous membranes are moist.  Eyes:     General:        Right eye: No discharge.        Left eye: No discharge.  Cardiovascular:     Rate and Rhythm: Normal rate and regular rhythm.     Pulses: Normal pulses.     Heart sounds: Normal heart sounds.  Pulmonary:     Effort: Pulmonary effort is normal. No respiratory distress.     Breath sounds: Normal breath sounds. No wheezing.  Abdominal:     General: Abdomen is flat. Bowel sounds are normal. There is no distension.     Palpations:  Abdomen is soft.     Tenderness: There is no abdominal tenderness.  Musculoskeletal:     Cervical back: Normal range of motion.     Right lower leg: No edema.     Left lower leg: No edema.     Comments: Right dorsiflexion 5/5, left 5/5.   Lymphadenopathy:     Cervical: No cervical adenopathy.  Skin:    General: Skin is warm and dry.     Capillary Refill: Capillary refill takes less than 2 seconds.     Comments: Left hip surgical incision CDI, dressing intact, no drainage. Surrounding tissue intact.   Neurological:     General: No focal deficit present.     Mental Status: He is alert and oriented to person, place, and time.     Motor: Weakness present.     Gait: Gait abnormal.     Comments: walker  Psychiatric:        Mood and Affect: Mood normal.        Behavior: Behavior normal.     Labs reviewed: Recent Labs    12/05/20 0118 12/06/20 0131 12/07/20 0159  NA 129* 127* 128*  K 4.1 3.8 3.6  CL 94* 93* 96*  CO2 23 21* 24   GLUCOSE 118* 103* 100*  BUN 22 22 20   CREATININE 1.05 0.92 0.99  CALCIUM 9.3 9.1 9.0   Recent Labs    12/04/20 0331  AST 20  ALT 12  ALKPHOS 68  BILITOT 1.0  PROT 6.2*  ALBUMIN 3.7   Recent Labs    12/03/20 1909 12/04/20 0331 12/05/20 0118 12/06/20 0131 12/07/20 0159  WBC 11.6* 10.1 9.2 7.5 6.9  NEUTROABS 10.0* 8.5*  --   --   --   HGB 14.5 13.9 12.2* 11.4* 11.7*  HCT 42.5 39.3 34.6* 32.0* 34.0*  MCV 86.2 85.2 85.9 85.1 85.6  PLT 284 259 210 184 211   Lab Results  Component Value Date   TSH 1.190 12/04/2020   No results found for: HGBA1C No results found for: CHOL, HDL, LDLCALC, LDLDIRECT, TRIG, CHOLHDL  Significant Diagnostic Results in last 30 days:  DG Chest 1 View  Result Date: 12/03/2020 CLINICAL DATA:  Fall today. EXAM: CHEST  1 VIEW COMPARISON:  January 23, 2011. FINDINGS: The heart size and mediastinal contours are within normal limits. Both lungs are clear. No pneumothorax or pleural effusion is noted. Hyperexpansion of the lungs is noted. The visualized skeletal structures are unremarkable. IMPRESSION: No active disease. Aortic Atherosclerosis (ICD10-I70.0). Electronically Signed   By: Marijo Conception M.D.   On: 12/03/2020 19:40   DG Knee Left Port  Result Date: 12/04/2020 CLINICAL DATA:  Left knee pain after fall yesterday. EXAM: PORTABLE LEFT KNEE - 1-2 VIEW COMPARISON:  None. FINDINGS: Diffuse decreased bone mineralization. No evidence of acute fracture or dislocation. No significant joint effusion. Calcified plaque over the femoral, popliteal and runoff vessels. IMPRESSION: No acute findings. Electronically Signed   By: Marin Olp M.D.   On: 12/04/2020 10:17   DG HIP UNILAT WITH PELVIS 2-3 VIEWS LEFT  Result Date: 12/04/2020 CLINICAL DATA:  Status post left hemiarthroplasty. EXAM: DG HIP (WITH OR WITHOUT PELVIS) 2-3V LEFT COMPARISON:  None. FINDINGS: Left hip prosthesis appears to be well situated. Expected postoperative changes are seen in the  surrounding soft tissues. IMPRESSION: Status post left hip arthroplasty. Electronically Signed   By: Marijo Conception M.D.   On: 12/04/2020 15:12   DG Hip Unilat With Pelvis 2-3 Views  Left  Result Date: 12/03/2020 CLINICAL DATA:  Left hip pain after fall. EXAM: DG HIP (WITH OR WITHOUT PELVIS) 2-3V LEFT COMPARISON:  None. FINDINGS: Moderately angulated fracture is seen involving the proximal left femoral neck. Right hip is unremarkable. IMPRESSION: Moderately angulated proximal left femoral neck fracture. Electronically Signed   By: Marijo Conception M.D.   On: 12/03/2020 19:38    Assessment/Plan 1. S/p left hip fracture - followed by Dr. Marcelino Scot - incision CDI, no sign of infection - cont lovenox 40 mg daily x 30 days for dvt prophylaxis - cot tylenol for pain prn - cont PT/OT - cbc/diff in 1 week - bmp in 1 week  2. Tobacco use - smoking history over 30 years - historyo f throat cancer - quit during hospitalization - refusing nicotine patch at this time - refusing mucinex to help with sputum   3. Mixed hyperlipidemia - stable with pravastatin  4. Essential hypertension - bp at goal - cont lisinopril  5. History of throat cancer - stable, voice soft, denies trouble swallowing, cont to monitor  - cont regular diet with thin liquids  6. Hyponatremia - history of hyponatremia, suspected SIADH - cont fluid restriction 1500cc/day    Family/ staff Communication: Plan discussed with patient and facility nurse  Labs/tests ordered:  Cbc/diff, bmp in 1 week

## 2020-12-10 ENCOUNTER — Encounter: Payer: Self-pay | Admitting: Internal Medicine

## 2020-12-10 ENCOUNTER — Non-Acute Institutional Stay (SKILLED_NURSING_FACILITY): Payer: Medicare Other | Admitting: Internal Medicine

## 2020-12-10 DIAGNOSIS — E782 Mixed hyperlipidemia: Secondary | ICD-10-CM

## 2020-12-10 DIAGNOSIS — Z85819 Personal history of malignant neoplasm of unspecified site of lip, oral cavity, and pharynx: Secondary | ICD-10-CM | POA: Insufficient documentation

## 2020-12-10 DIAGNOSIS — Z720 Tobacco use: Secondary | ICD-10-CM

## 2020-12-10 DIAGNOSIS — Z8781 Personal history of (healed) traumatic fracture: Secondary | ICD-10-CM | POA: Insufficient documentation

## 2020-12-10 DIAGNOSIS — I10 Essential (primary) hypertension: Secondary | ICD-10-CM | POA: Diagnosis not present

## 2020-12-10 DIAGNOSIS — E871 Hypo-osmolality and hyponatremia: Secondary | ICD-10-CM

## 2020-12-10 NOTE — Progress Notes (Signed)
Provider:  Rexene Edison. Mariea Clonts, D.O., C.M.D. Location:  [facility] Room Number: 546/F Place of Service:  SNF (31)  PCP: Powers, Charolotte Eke, MD Patient Care Team: Saralyn Pilar, MD as PCP - General (Family Medicine)  Extended Emergency Contact Information Primary Emergency Contact: Corrinne Eagle Address: [address]          [place], [place] [address] [place] Phone: [phone] Relation: Spouse Hard of hearing? Yes Secondary Emergency Contact: Timmothy Sours Phone: [phone] Relation: Daughter  Code Status: FULL Goals of Care: Advanced Directive information Advanced Directives 12/10/2020  Does Patient Have a Medical Advance Directive? No  Type of Advance Directive -  Does patient want to make changes to medical advance directive? No - Patient declined  Would patient like information on creating a medical advance directive? -      Chief Complaint  Patient presents with  . New Admit To SNF    New Admission to [facility]     HPI: Patient is a 80 y.o. male seen today for admission to his farm living and rehab status post hospitalization from February 25 to March 2 at [facility].  He has a past medical history significant for hypertension, hyperlipidemia, tobacco abuse.  He sustained a fall while working at home but did not strike his head or his consciousness.  He was brought to the emergency department in [facility] due to pain in his left hip area.  In the emergency department x-rays revealed a left hip fracture and Dr. Mardelle Matte was consulted.  Labs showed hyponatremia to 129 and it appears that his baseline is around 130.  He had mild leukocytosis.  His Covid test was negative.  His EKG showed nonspecific changes.  His chest x-ray showed no acute abnormality.  He underwent his hip surgery on February 26 (left hip hemiarthroplasty by Dr. Marcelino Scot).  He is on Lovenox DVT prophylaxis for 4 weeks.  His PT OT  eval recommended skilled nursing for rehab.  He does have strong family support to return home after that.  He was found to have vitamin D deficiency and started on repletion.  He is to follow-up with Dr. Marcelino Scot in 2 weeks.  He was given a flutter valve and incentive spirometry to prevent atelectasis.  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  His lisinopril/HCTZ and amlodipine were stopped.  MP was recommended in 1 week.  When seen, he was [facility] and had been napping.  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  He indicates he is eager to d/c home.  He was up walking in his room with his walker "almost normally" per his daughter present during the visit.  We reviewed that his bp meds were stopped at the hospital due to his hyponatremia and discussed that one was hctz.  He was also put on a new fluid restriction of 1500 cc.  We discussed that there may be room with that b/c of stopping the diuretic if the sodium is reassessed.  He was not having any hip pain and said he did not even feel uncomfortable during PT.  BP ranging from systolic 466Z-993T majority of time with one outlier in 140s seen.  He did not want the nicotine patches.    Past Medical History:  Diagnosis Date  . Hypertension   .  Throat cancer [facility])    Past Surgical History:  Procedure Laterality Date  . BACK SURGERY    . HIP ARTHROPLASTY Left 12/04/2020   Procedure: HIP HEMIARTHROPLASTY;  Surgeon: Altamese Iatan, MD;  Location: [place] [facility];  Service: Orthopedics;  Laterality: Left;  . PROSTATE SURGERY      Social History   Socioeconomic History  . Marital status: Married    Spouse name: Not on file  . Number of children: Not on file  . Years of education: Not on file  . Highest education level: Not on file  Occupational History  . Not on file  Tobacco Use  . Smoking status: Current Every Day Smoker    Packs/day: 0.50  . Smokeless tobacco:  Never Used  Vaping Use  . Vaping Use: Never used  Substance and Sexual Activity  . Alcohol use: No  . Drug use: Never  . Sexual activity: Not on file  Other Topics Concern  . Not on file  Social History Narrative  . Not on file   Social Determinants of Health   Financial Resource Strain: Not on file  Food Insecurity: Not on file  Transportation Needs: Not on file  Physical Activity: Not on file  Stress: Not on file  Social Connections: Not on file    reports that he has been smoking. He has been smoking about 0.50 packs per day. He has never used smokeless tobacco. He reports that he does not drink alcohol and does not use drugs.  Functional Status Survey:    Family History  Problem Relation Age of Onset  . Diabetes Mellitus II Father     Health Maintenance  Topic Date Due  . TETANUS/TDAP  10/18/2014  . COVID-19 Vaccine (4 - Booster for [place] series) 06/09/2021  . INFLUENZA VACCINE  Completed  . PNA vac Low Risk Adult  Completed  . HPV VACCINES  Aged Out    No Known Allergies  Outpatient Encounter Medications as of 12/10/2020  Medication Sig  . acetaminophen (TYLENOL) 325 MG tablet Take 2 tablets (650 mg total) by mouth every 6 (six) hours as needed for mild pain or moderate pain.  Marland Kitchen albuterol (PROVENTIL) (2.5 MG/3ML) 0.083% nebulizer solution Take 3 mLs (2.5 mg total) by nebulization every 2 (two) hours as needed for wheezing or shortness of breath.  Marland Kitchen alum & mag hydroxide-simeth (MAALOX/MYLANTA) 200-200-20 MG/5ML suspension Take 30 mLs by mouth every 2 (two) hours as needed for indigestion or heartburn.  . cholecalciferol (VITAMIN D) 25 MCG tablet Take 2 tablets (2,000 Units total) by mouth 2 (two) times daily.  . diclofenac Sodium (VOLTAREN) 1 % GEL Apply 2 g topically 4 (four) times daily.  Marland Kitchen enoxaparin (LOVENOX) 40 MG/0.4ML injection Inject 0.4 mLs (40 mg total) into the skin daily.  . feeding supplement (ENSURE ENLIVE / ENSURE PLUS) LIQD Take 237 mLs by mouth 2  (two) times daily between meals.  Marland Kitchen lisinopril (ZESTRIL) 20 MG tablet Take 1 tablet (20 mg total) by mouth daily.  . polyethylene glycol (MIRALAX / GLYCOLAX) 17 g packet Take 17 g by mouth daily as needed.  . pravastatin (PRAVACHOL) 40 MG tablet Take 40 mg by mouth daily.  . sodium chloride (OCEAN) 0.65 % SOLN nasal spray Place 1 spray into both nostrils every 4 (four) hours as needed for congestion.  . [DISCONTINUED] ascorbic acid (VITAMIN C) 500 MG tablet Take 1 tablet (500 mg total) by mouth daily.  . [DISCONTINUED] docusate sodium (COLACE) 100 MG capsule Take 1 capsule (  100 mg total) by mouth 2 (two) times daily.  . [DISCONTINUED] nicotine (NICODERM CQ - DOSED IN MG/24 HR) 7 mg/24hr patch Place 1 patch (7 mg total) onto the skin daily.  . [DISCONTINUED] sodium chloride (OCEAN) 0.65 % SOLN nasal spray Place 1 spray into both nostrils as needed for congestion.   No facility-administered encounter medications on file as of 12/10/2020.    Review of Systems  Constitutional: Positive for malaise/fatigue. Negative for chills and fever.  HENT: Positive for hearing loss.   Eyes: Negative for blurred vision.       Glasses  Respiratory: Negative for cough and shortness of breath.   Cardiovascular: Negative for chest pain, palpitations and leg swelling.  Gastrointestinal: Negative for abdominal pain and constipation.  Genitourinary: Negative for dysuria.  Musculoskeletal: Positive for falls. Negative for joint pain.  Skin: Negative for itching and rash.  Neurological: Negative for dizziness, loss of consciousness and weakness.  Endo/Heme/Allergies: Bruises/bleeds easily.  Psychiatric/Behavioral: Negative for depression, hallucinations and memory loss. The patient is not nervous/anxious and does not have insomnia.     Vitals:   12/10/20 1559  BP: 110/68  Pulse: 70  Resp: 18  Temp: (!) 97.1 F (36.2 C)  Weight: 140 lb (63.5 kg)  Height: 5' 7"  (1.702 m)   Body mass index is 21.93  kg/m. Physical Exam Vitals reviewed.  Constitutional:      General: He is not in acute distress.    Appearance: Normal appearance.     Comments: Small stature male resting in bed  HENT:     Head: Normocephalic and atraumatic.     Right Ear: External ear normal.     Left Ear: External ear normal.     Ears:     Comments: Very HOH    Nose: Nose normal.     Mouth/Throat:     Pharynx: Oropharynx is clear.     Comments: Chronic hoarseness Eyes:     Extraocular Movements: Extraocular movements intact.     Conjunctiva/sclera: Conjunctivae normal.     Pupils: Pupils are equal, round, and reactive to light.     Comments: glasses  Cardiovascular:     Rate and Rhythm: Normal rate and regular rhythm.     Heart sounds: No murmur heard.   Pulmonary:     Effort: Pulmonary effort is normal.     Breath sounds: Normal breath sounds. No wheezing, rhonchi or rales.  Abdominal:     General: Bowel sounds are normal. There is no distension.     Palpations: There is no mass.     Tenderness: There is no abdominal tenderness. There is no guarding or rebound.  Musculoskeletal:        General: No tenderness.     Cervical back: Neck supple.     Right lower leg: No edema.     Left lower leg: No edema.  Lymphadenopathy:     Cervical: No cervical adenopathy.  Skin:    General: Skin is warm and dry.     Comments: Left hip incision site w/o significant erythema, warmth, swelling or drainage  Neurological:     General: No focal deficit present.     Mental Status: He is alert and oriented to person, place, and time.     Motor: Weakness present.     Gait: Gait normal.     Comments: Walking with walker with PT now  Psychiatric:        Mood and Affect: Mood normal.  Labs reviewed: Basic Metabolic Panel: Recent Labs    12/05/20 0118 12/06/20 0131 12/07/20 0159  NA 129* 127* 128*  K 4.1 3.8 3.6  CL 94* 93* 96*  CO2 23 21* 24  GLUCOSE 118* 103* 100*  BUN 22 22 20   CREATININE 1.05 0.92  0.99  CALCIUM 9.3 9.1 9.0   Liver Function Tests: Recent Labs    12/04/20 0331  AST 20  ALT 12  ALKPHOS 68  BILITOT 1.0  PROT 6.2*  ALBUMIN 3.7   No results for input(s): LIPASE, AMYLASE in the last 8760 hours. No results for input(s): AMMONIA in the last 8760 hours. CBC: Recent Labs    12/03/20 1909 12/04/20 0331 12/05/20 0118 12/06/20 0131 12/07/20 0159  WBC 11.6* 10.1 9.2 7.5 6.9  NEUTROABS 10.0* 8.5*  --   --   --   HGB 14.5 13.9 12.2* 11.4* 11.7*  HCT 42.5 39.3 34.6* 32.0* 34.0*  MCV 86.2 85.2 85.9 85.1 85.6  PLT 284 259 210 184 211   Cardiac Enzymes: No results for input(s): CKTOTAL, CKMB, CKMBINDEX, TROPONINI in the last 8760 hours. BNP: Invalid input(s): POCBNP No results found for: HGBA1C Lab Results  Component Value Date   TSH 1.190 12/04/2020   No results found for: VITAMINB12 No results found for: FOLATE No results found for: IRON, TIBC, FERRITIN  Imaging and Procedures obtained prior to SNF admission: DG Chest 1 View  Result Date: 12/03/2020 CLINICAL DATA:  Fall today. EXAM: CHEST  1 VIEW COMPARISON:  January 23, 2011. FINDINGS: The heart size and mediastinal contours are within normal limits. Both lungs are clear. No pneumothorax or pleural effusion is noted. Hyperexpansion of the lungs is noted. The visualized skeletal structures are unremarkable. IMPRESSION: No active disease. Aortic Atherosclerosis (ICD10-I70.0). Electronically Signed   By: Marijo Conception M.D.   On: 12/03/2020 19:40   DG Knee Left Port  Result Date: 12/04/2020 CLINICAL DATA:  Left knee pain after fall yesterday. EXAM: PORTABLE LEFT KNEE - 1-2 VIEW COMPARISON:  None. FINDINGS: Diffuse decreased bone mineralization. No evidence of acute fracture or dislocation. No significant joint effusion. Calcified plaque over the femoral, popliteal and runoff vessels. IMPRESSION: No acute findings. Electronically Signed   By: Marin Olp M.D.   On: 12/04/2020 10:17   DG HIP UNILAT WITH PELVIS  2-3 VIEWS LEFT  Result Date: 12/04/2020 CLINICAL DATA:  Status post left hemiarthroplasty. EXAM: DG HIP (WITH OR WITHOUT PELVIS) 2-3V LEFT COMPARISON:  None. FINDINGS: Left hip prosthesis appears to be well situated. Expected postoperative changes are seen in the surrounding soft tissues. IMPRESSION: Status post left hip arthroplasty. Electronically Signed   By: Marijo Conception M.D.   On: 12/04/2020 15:12   DG Hip Unilat With Pelvis 2-3 Views Left  Result Date: 12/03/2020 CLINICAL DATA:  Left hip pain after fall. EXAM: DG HIP (WITH OR WITHOUT PELVIS) 2-3V LEFT COMPARISON:  None. FINDINGS: Moderately angulated fracture is seen involving the proximal left femoral neck. Right hip is unremarkable. IMPRESSION: Moderately angulated proximal left femoral neck fracture. Electronically Signed   By: Marijo Conception M.D.   On: 12/03/2020 19:38    Assessment/Plan 1. S/p left hip fracture -f/u with orthopedics as planned -on lovenox dvt prophylaxis -pain controlled with tylenol  2. Tobacco use -has not smoked since fall -refused nicoderm patches here -encouraged him not to restart  3. Mixed hyperlipidemia -on pravachol, cont home regimen  4. Essential hypertension -bp controlled, monitor for lows  5. History of throat  cancer -with some chronic hoarseness -encouraged ongoing smoking cessation  6. 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 River Mckercher, D.O. [facility] [address], [place] [address] Cell Phone (Mon-Fri 8am-5pm):  [phone] On Call:  [phone] & follow prompts after 5pm & weekends Office Phone:  [phone] Office Fax:  [phone]

## 2020-12-16 ENCOUNTER — Non-Acute Institutional Stay (SKILLED_NURSING_FACILITY): Payer: Medicare Other | Admitting: Orthopedic Surgery

## 2020-12-16 ENCOUNTER — Encounter: Payer: Self-pay | Admitting: Orthopedic Surgery

## 2020-12-16 ENCOUNTER — Other Ambulatory Visit: Payer: Self-pay | Admitting: Orthopedic Surgery

## 2020-12-16 DIAGNOSIS — E782 Mixed hyperlipidemia: Secondary | ICD-10-CM

## 2020-12-16 DIAGNOSIS — I10 Essential (primary) hypertension: Secondary | ICD-10-CM

## 2020-12-16 DIAGNOSIS — Z8781 Personal history of (healed) traumatic fracture: Secondary | ICD-10-CM | POA: Diagnosis not present

## 2020-12-16 DIAGNOSIS — Z720 Tobacco use: Secondary | ICD-10-CM | POA: Diagnosis not present

## 2020-12-16 DIAGNOSIS — F22 Delusional disorders: Secondary | ICD-10-CM

## 2020-12-16 DIAGNOSIS — Z85819 Personal history of malignant neoplasm of unspecified site of lip, oral cavity, and pharynx: Secondary | ICD-10-CM

## 2020-12-16 DIAGNOSIS — E871 Hypo-osmolality and hyponatremia: Secondary | ICD-10-CM

## 2020-12-16 MED ORDER — VITAMIN D3 25 MCG PO TABS
2000.0000 [IU] | ORAL_TABLET | Freq: Two times a day (BID) | ORAL | 0 refills | Status: DC
Start: 2020-12-16 — End: 2021-11-23

## 2020-12-16 MED ORDER — ENOXAPARIN SODIUM 40 MG/0.4ML ~~LOC~~ SOLN
40.0000 mg | SUBCUTANEOUS | 0 refills | Status: DC
Start: 2020-12-16 — End: 2021-11-23

## 2020-12-16 MED ORDER — PRAVASTATIN SODIUM 40 MG PO TABS: 40.0000 mg | ORAL_TABLET | Freq: Every day | ORAL | 0 refills | Status: AC

## 2020-12-16 MED ORDER — ALBUTEROL SULFATE (2.5 MG/3ML) 0.083% IN NEBU: 2.5000 mg | INHALATION_SOLUTION | RESPIRATORY_TRACT | 0 refills | Status: AC | PRN

## 2020-12-16 MED ORDER — LISINOPRIL 20 MG PO TABS: 20.0000 mg | ORAL_TABLET | Freq: Every day | ORAL | 0 refills | Status: AC

## 2020-12-16 MED ORDER — AMLODIPINE BESYLATE 2.5 MG PO TABS: 2.5000 mg | ORAL_TABLET | Freq: Every day | ORAL | 0 refills | Status: AC

## 2020-12-16 NOTE — Progress Notes (Signed)
Location:    [place] Room Number: 416/S Place of Service:  SNF [phone])  Provider: Windell Moulding NP  PCP: Saralyn Pilar, MD Patient Care Team: Saralyn Pilar, MD as PCP - General (Family Medicine)  Extended Emergency Contact Information Primary Emergency Contact: Corrinne Eagle Address: [address]          [place], [place] [address] [place] Phone: [phone] Relation: Spouse Hard of hearing? Yes Secondary Emergency Contact: Timmothy Sours Phone: [phone] Relation: Daughter  Code Status: Full Code Goals of care:  Advanced Directive information Advanced Directives 12/16/2020  Does Patient Have a Medical Advance Directive? No  Type of Advance Directive -  Does patient want to make changes to medical advance directive? No - Patient declined  Would patient like information on creating a medical advance directive? -     No Known Allergies  Chief Complaint  Patient presents with  . Discharge Note    Discharge Visit     HPI:  80 y.o. male seen today for discharge evaluation.   He currently resides at [facility], seen today at bedside. PMH includes: hypertension, hyperlipidemia, tobacco abuse and throat cancer.   He was hospitalized at [facility] 02/25- 03/02 for left femoral neck fracture due to fall. No other injuries reported. Prior to hospitalization, he lived at home with wife. 02/26 he underwent bipolar hemiarthroplasty of the left hip by Dr. Altamese [place]. During his stay he was treated for hyponatremia, sodium level initially 129. HCTZ discontinued. Suspected SIADH, 1500 cc fluid restriction recommended. In addition, he has quit smoking since hospitalization. Refusing nicotine substitutions at this time. Discharged to snf for additional therapy.   Today, he is laying in bed. Daughters present during encounter. Alert to self, person, and time. States he is in the hospital, names  Biden as president. Daughters express concerns about his memory. Thinks he has become more confused within the last 2 days. Facility nurse collected UA and culture, results pending. Sodium level collected 03/08- sodium improved to 133. Facility nurse reports he will wake up at night and think he is at home. At this time, he is ambulating 150 ft with walker. Minimal assist with ADLs. Appetite fair. Last BM yesterday. Denies chest pain, sob or calf pain at this time.   No recent falls or injuries.   Facility nurse does not report any other injuries.   At this time, he plans to discharge home 12/18/2020. Wife and daughters will assist with transportation and care. Home health PT/OT ordered. Rolling walker ordered. I have advised him to continue follow up with Dr. Marcelino Scot regarding Lovenox injections and surgical incision. I have also advised him to follow up with PCP in 1-2 weeks for lab work.    Past Medical History:  Diagnosis Date  . Hypertension   . Throat cancer [facility])     Past Surgical History:  Procedure Laterality Date  . BACK SURGERY    . HIP ARTHROPLASTY Left 12/04/2020   Procedure: HIP HEMIARTHROPLASTY;  Surgeon: Altamese Robinhood, MD;  Location: [place];  Service: Orthopedics;  Laterality: Left;  . PROSTATE SURGERY        reports that he has been smoking. He has been smoking about 0.50 packs per day. He has never used smokeless tobacco. He reports that he does not drink alcohol and does not use drugs. Social History   Socioeconomic History  . Marital status: Married    Spouse  name: Not on file  . Number of children: Not on file  . Years of education: Not on file  . Highest education level: Not on file  Occupational History  . Not on file  Tobacco Use  . Smoking status: Current Every Day Smoker    Packs/day: 0.50  . Smokeless tobacco: Never Used  Vaping Use  . Vaping Use: Never used  Substance and Sexual Activity  . Alcohol use: No  . Drug use: Never  . Sexual activity: Not  on file  Other Topics Concern  . Not on file  Social History Narrative  . Not on file   Social Determinants of Health   Financial Resource Strain: Not on file  Food Insecurity: Not on file  Transportation Needs: Not on file  Physical Activity: Not on file  Stress: Not on file  Social Connections: Not on file  Intimate Partner Violence: Not on file   Functional Status Survey:    No Known Allergies  Pertinent  Health Maintenance Due  Topic Date Due  . INFLUENZA VACCINE  Completed  . PNA vac Low Risk Adult  Completed    Medications: Allergies as of 12/16/2020   No Known Allergies     Medication List       Accurate as of December 16, 2020 11:45 AM. If you have any questions, ask your nurse or doctor.        STOP taking these medications   alum & mag hydroxide-simeth 200-200-20 MG/5ML suspension Commonly known as: MAALOX/MYLANTA Stopped by: Yvonna Alanis, NP     TAKE these medications   acetaminophen 325 MG tablet Commonly known as: TYLENOL Take 2 tablets (650 mg total) by mouth every 6 (six) hours as needed for mild pain or moderate pain.   albuterol (2.5 MG/3ML) 0.083% nebulizer solution Commonly known as: PROVENTIL Take 3 mLs (2.5 mg total) by nebulization every 2 (two) hours as needed for wheezing or shortness of breath.   amLODipine 2.5 MG tablet Commonly known as: NORVASC Take 2.5 mg by mouth daily. For HTN   diclofenac Sodium 1 % Gel Commonly known as: VOLTAREN Apply 2 g topically 4 (four) times daily.   enoxaparin 40 MG/0.4ML injection Commonly known as: LOVENOX Inject 0.4 mLs (40 mg total) into the skin daily.   feeding supplement Liqd Take 237 mLs by mouth 2 (two) times daily between meals.   lisinopril 20 MG tablet Commonly known as: ZESTRIL Take 1 tablet (20 mg total) by mouth daily.   polyethylene glycol 17 g packet Commonly known as: MIRALAX / GLYCOLAX Take 17 g by mouth daily as needed.   pravastatin 40 MG tablet Commonly known as:  PRAVACHOL Take 40 mg by mouth daily.   sodium chloride 0.65 % Soln nasal spray Commonly known as: OCEAN Place 1 spray into both nostrils every 4 (four) hours as needed for congestion.   Vitamin D3 25 MCG tablet Commonly known as: Vitamin D Take 2 tablets (2,000 Units total) by mouth 2 (two) times daily.       Review of Systems  Constitutional: Negative for activity change, appetite change and fever.  HENT: Positive for trouble swallowing and voice change. Negative for congestion, dental problem and hearing loss.   Eyes: Negative for visual disturbance.       Glasses  Respiratory: Negative for cough, shortness of breath and wheezing.   Cardiovascular: Negative for chest pain and leg swelling.  Gastrointestinal: Negative for abdominal distention, abdominal pain, constipation, diarrhea and nausea.  Genitourinary: Negative for dysuria, frequency and hematuria.  Musculoskeletal: Positive for arthralgias and myalgias.       Left hip pain  Skin:       Surgical inision  Neurological: Positive for weakness. Negative for dizziness and headaches.  Hematological: Bruises/bleeds easily.  Psychiatric/Behavioral: Positive for confusion. Negative for dysphoric mood. The patient is not nervous/anxious.     Vitals:   12/16/20 1141  BP: 118/65  Pulse: 77  Resp: 18  Temp: (!) 96.9 F (36.1 C)  Weight: 140 lb (63.5 kg)  Height: 5\' 7"  (1.702 m)   Body mass index is 21.93 kg/m. Physical Exam Vitals reviewed.  Constitutional:      General: He is not in acute distress. HENT:     Head: Normocephalic.     Right Ear: There is no impacted cerumen.     Left Ear: There is no impacted cerumen.     Nose: Nose normal.     Mouth/Throat:     Mouth: Mucous membranes are moist.  Eyes:     General:        Right eye: No discharge.        Left eye: No discharge.  Cardiovascular:     Rate and Rhythm: Normal rate and regular rhythm.     Pulses: Normal pulses.     Heart sounds: Normal heart sounds.   Pulmonary:     Effort: Pulmonary effort is normal. No respiratory distress.     Breath sounds: Normal breath sounds. No wheezing.  Abdominal:     General: Abdomen is flat. Bowel sounds are normal. There is no distension.     Palpations: Abdomen is soft.     Tenderness: There is no abdominal tenderness.  Musculoskeletal:     Cervical back: Normal range of motion.     Right lower leg: No edema.     Left lower leg: No edema.     Comments: Right and left dorsiflexion 5/5.   Lymphadenopathy:     Cervical: No cervical adenopathy.  Skin:    General: Skin is warm and dry.     Capillary Refill: Capillary refill takes less than 2 seconds.     Comments: Mepilex CDI. No drainage. Surrounding tissue intact.   Neurological:     General: No focal deficit present.     Mental Status: He is alert and oriented to person, place, and time.     Motor: Weakness present.     Gait: Gait abnormal.     Comments: walker  Psychiatric:        Mood and Affect: Mood normal. Affect is flat.        Behavior: Behavior normal.     Labs reviewed: Basic Metabolic Panel: Recent Labs    12/05/20 0118 12/06/20 0131 12/07/20 0159  NA 129* 127* 128*  K 4.1 3.8 3.6  CL 94* 93* 96*  CO2 23 21* 24  GLUCOSE 118* 103* 100*  BUN 22 22 20   CREATININE 1.05 0.92 0.99  CALCIUM 9.3 9.1 9.0   Liver Function Tests: Recent Labs    12/04/20 0331  AST 20  ALT 12  ALKPHOS 68  BILITOT 1.0  PROT 6.2*  ALBUMIN 3.7   No results for input(s): LIPASE, AMYLASE in the last 8760 hours. No results for input(s): AMMONIA in the last 8760 hours. CBC: Recent Labs    12/03/20 1909 12/04/20 0331 12/05/20 0118 12/06/20 0131 12/07/20 0159  WBC 11.6* 10.1 9.2 7.5 6.9  NEUTROABS 10.0* 8.5*  --   --   --  HGB 14.5 13.9 12.2* 11.4* 11.7*  HCT 42.5 39.3 34.6* 32.0* 34.0*  MCV 86.2 85.2 85.9 85.1 85.6  PLT 284 259 210 184 211   Cardiac Enzymes: No results for input(s): CKTOTAL, CKMB, CKMBINDEX, TROPONINI in the last 8760  hours. BNP: Invalid input(s): POCBNP CBG: No results for input(s): GLUCAP in the last 8760 hours.  Procedures and Imaging Studies During Stay: DG Chest 1 View  Result Date: 12/03/2020 CLINICAL DATA:  Fall today. EXAM: CHEST  1 VIEW COMPARISON:  January 23, 2011. FINDINGS: The heart size and mediastinal contours are within normal limits. Both lungs are clear. No pneumothorax or pleural effusion is noted. Hyperexpansion of the lungs is noted. The visualized skeletal structures are unremarkable. IMPRESSION: No active disease. Aortic Atherosclerosis (ICD10-I70.0). Electronically Signed   By: Marijo Conception M.D.   On: 12/03/2020 19:40   DG Knee Left Port  Result Date: 12/04/2020 CLINICAL DATA:  Left knee pain after fall yesterday. EXAM: PORTABLE LEFT KNEE - 1-2 VIEW COMPARISON:  None. FINDINGS: Diffuse decreased bone mineralization. No evidence of acute fracture or dislocation. No significant joint effusion. Calcified plaque over the femoral, popliteal and runoff vessels. IMPRESSION: No acute findings. Electronically Signed   By: Marin Olp M.D.   On: 12/04/2020 10:17   DG HIP UNILAT WITH PELVIS 2-3 VIEWS LEFT  Result Date: 12/04/2020 CLINICAL DATA:  Status post left hemiarthroplasty. EXAM: DG HIP (WITH OR WITHOUT PELVIS) 2-3V LEFT COMPARISON:  None. FINDINGS: Left hip prosthesis appears to be well situated. Expected postoperative changes are seen in the surrounding soft tissues. IMPRESSION: Status post left hip arthroplasty. Electronically Signed   By: Marijo Conception M.D.   On: 12/04/2020 15:12   DG Hip Unilat With Pelvis 2-3 Views Left  Result Date: 12/03/2020 CLINICAL DATA:  Left hip pain after fall. EXAM: DG HIP (WITH OR WITHOUT PELVIS) 2-3V LEFT COMPARISON:  None. FINDINGS: Moderately angulated fracture is seen involving the proximal left femoral neck. Right hip is unremarkable. IMPRESSION: Moderately angulated proximal left femoral neck fracture. Electronically Signed   By: Marijo Conception  M.D.   On: 12/03/2020 19:38    Assessment/Plan:   1. S/p left hip fracture - followed by Dr. Marcelino Scot - incision CDI, no sign of infection - cont Lovenox 40 mg po daily x 4 weeks- stop date 3/26 - cont tylenol prn for pain - home health PT/OT - cbc/diff- future - bmp- future  2. Tobacco use - recently quit after smoking > 30 years - refusing nicotine substitutions at this time - advised to continue smoking cessation to reduce risk of postsurgical infection  3. Mixed hyperlipidemia - stable with pravastatin  4. Essential hypertension - at goal with lisinopril - cont heart healthy diet  5. History of throat cancer - voice soft - cont regular diet with thin liquids  6. Hyponatremia - sodium 03/08- 133 - bmp- future  7. Delusions ([place]) - alert and oriented x 3 - suspect he thinks he is at home at times - UA and culture pending- no other symptoms of UTI   Patient is being discharged with the following home health services: PT/OT   Patient is being discharged with the following durable medical equipment: Rolling walker   Patient has been advised to f/u with their PCP in 1-2 weeks to bring them up to date on their rehab stay.  Social services at facility was responsible for arranging this appointment.  Pt was provided with a 30 day supply of prescriptions for  medications and refills must be obtained from their PCP.  For controlled substances, a more limited supply may be provided adequate until PCP appointment only.  Future labs/tests needed:  Cbc/diff, bmp

## 2021-03-03 ENCOUNTER — Ambulatory Visit: Payer: Medicare Other | Attending: Orthopedic Surgery | Admitting: Rehabilitative and Restorative Service Providers"

## 2021-03-03 ENCOUNTER — Encounter: Payer: Self-pay | Admitting: Rehabilitative and Restorative Service Providers"

## 2021-03-03 ENCOUNTER — Other Ambulatory Visit: Payer: Self-pay

## 2021-03-03 DIAGNOSIS — M6281 Muscle weakness (generalized): Secondary | ICD-10-CM | POA: Insufficient documentation

## 2021-03-03 DIAGNOSIS — M25551 Pain in right hip: Secondary | ICD-10-CM | POA: Insufficient documentation

## 2021-03-03 DIAGNOSIS — R600 Localized edema: Secondary | ICD-10-CM | POA: Diagnosis present

## 2021-03-03 DIAGNOSIS — R262 Difficulty in walking, not elsewhere classified: Secondary | ICD-10-CM | POA: Insufficient documentation

## 2021-03-03 NOTE — Therapy (Signed)
[place]. [place], [place], [address] Phone: [phone]   Fax:  [phone]  Physical Therapy Evaluation  Patient Details  Name: Sergio Chavez MRN: [id number] Date of Birth: June 10, 1941 Referring Provider (PT): Ainsley Spinner, PA-C   Encounter Date: 03/03/2021   PT End of Session - 03/03/21 1140    Visit Number 1    Date for PT Re-Evaluation 05/20/21    PT Start Time 1100    PT Stop Time 1144    PT Time Calculation (min) 44 min    Activity Tolerance Patient tolerated treatment well    Behavior During Therapy [facility] for tasks assessed/performed           Past Medical History:  Diagnosis Date  . Hypertension   . Throat cancer [facility])     Past Surgical History:  Procedure Laterality Date  . BACK SURGERY    . HIP ARTHROPLASTY Left 12/04/2020   Procedure: HIP HEMIARTHROPLASTY;  Surgeon: Altamese [place], MD;  Location: [place];  Service: Orthopedics;  Laterality: Left;  . PROSTATE SURGERY      There were no vitals filed for this visit.    Subjective Assessment - 03/03/21 1105    Subjective Pt reports that he was outside at twilight in his garage and tripped over something and sustained a L femoral neck Fx s/p replacement.  Pt initially discharged to SNF for therapy, then home with his wife with home health.  Pt reports that walking is his main impariment now.    Patient is accompained by: Family member   [name], wife   Pertinent History Hx of back surgery    Limitations Walking    Patient Stated Goals To walk without a cane as he did before.    Currently in Pain? Yes    Pain Score 3     Pain Location Hip    Pain Orientation Left    Pain Descriptors / Indicators Throbbing;Aching    Pain Type Surgical pain    Pain Onset More than a month ago    Pain Frequency Intermittent    Aggravating Factors  walking              [facility] PT Assessment - 03/03/21 0001      Assessment   Medical Diagnosis L femoral neck  Fx s/p hemiarthoplasty    Referring Provider (PT) Ainsley Spinner, PA-C    Onset Date/Surgical Date 12/03/20    Hand Dominance Right    Next MD Visit August 2022    Prior Therapy home health prior      Precautions   Precautions Posterior Hip;Fall    Precaution Comments wife states they have a handout      Restrictions   Weight Bearing Restrictions No      Balance Screen   Has the patient fallen in the past 6 months Yes    How many times? 1    Has the patient had a decrease in activity level because of a fear of falling?  No    Is the patient reluctant to leave their home because of a fear of falling?  No      Home Environment   Living Environment Private residence    Living Arrangements Spouse/significant other    Type of [place] to enter    Entrance Stairs-Number of Steps 2    Entrance Stairs-Rails None    Home Layout One level  [place] - 2 wheels;Bedside commode;Grab bars - tub/shower      Prior Function   Level of Independence Independent    Vocation Retired    Leisure yard work      Observation/Other Assessments   Focus on Therapeutic Outcomes (FOTO)  58%, risk adjusted 50%      ROM / Strength   AROM / PROM / Strength Strength      Strength   Overall Strength Deficits    Overall Strength Comments L hip and knee strength of 68minus/5 grossly throughout      Transfers   Five time sit to stand comments  15.99 sec      Standardized Balance Assessment   Standardized Balance Assessment Timed Up and Go Test      Timed Up and Go Test   Normal TUG (seconds) 17.86                      Objective measurements completed on examination: See above findings.       [facility] Adult PT Treatment/Exercise - 03/03/21 0001      Exercises   Exercises Knee/Hip      Knee/Hip Exercises: Aerobic   Nustep L2 x6 min                  PT Education - 03/03/21 1137    Education Details Reviewed HEP from home  health    Person(s) Educated Patient;Spouse    Methods Handout            PT Short Term Goals - 03/03/21 1345      PT SHORT TERM GOAL #1   Title Pt will be independent with initial HEP.    Time 2    Period Weeks    Status New             PT Long Term Goals - 03/03/21 1345      PT LONG TERM GOAL #1   Title Pt will be independent with advanced HEP.    Time 12    Period Weeks    Status New      PT LONG TERM GOAL #2   Title Patient will increase L hip and knee strength to at least 26minus to 5/5 to allow him to ambulate without device.    Time 12    Period Weeks    Status New      PT LONG TERM GOAL #3   Title Patient will decrease TUG to less than 14 seconds without device to place him at a low risk of falling.    Time 12    Period Weeks    Status New      PT LONG TERM GOAL #4   Title Pt will return to his previous ambulation routine without assistive device and no increase in pain.    Time 12    Period Weeks    Status New                  Plan - 03/03/21 1339    Clinical Impression Statement Patient presents with a referral from [facility], PA-C secondary to L femoral neck fx s/p L hemiarthroplasty to repair.  He has been successfully working with therapy in SNF and then home health and now has progressed to outpatient PT.  He presents with R hip pain, weakness, and difficulty walking.  He is currently requiring use of quad cane and would like to return to  ambulation without a device.  Patient with increased time required on 5 times sit to/from stand and TUG.  He would benefit from skilled PT to progress him towards goal related acitivities to allow him to be safer and more independent.    Personal Factors and Comorbidities Age    Examination-Activity Limitations Locomotion Level;Stairs    Examination-Participation Restrictions Community Activity;Yard Work    Stability/Clinical Decision Making Stable/Uncomplicated    Clinical Decision Making Low    Rehab  Potential Good    PT Frequency 2x / week    PT Duration 12 weeks    PT Treatment/Interventions ADLs/Self Care Home Management;Cryotherapy;Electrical Stimulation;Iontophoresis 4mg /ml Dexamethasone;Moist Heat;Ultrasound;Gait training;Stair training;Therapeutic activities;Functional mobility training;Therapeutic exercise;Balance training;Neuromuscular re-education;Patient/family education;Manual techniques;Scar mobilization;Passive range of motion;Dry needling;Energy conservation    PT Next Visit Plan Progress strengthening, ambulation, balance    PT Home Exercise Plan continue HEP handout from home health    Consulted and Agree with Plan of Care Patient;Family member/caregiver           Patient will benefit from skilled therapeutic intervention in order to improve the following deficits and impairments:  Decreased balance,Difficulty walking,Decreased activity tolerance,Decreased strength,Pain  Visit Diagnosis: Pain in right hip - Plan: PT plan of care cert/re-cert  Muscle weakness (generalized) - Plan: PT plan of care cert/re-cert  Difficulty in walking, not elsewhere classified - Plan: PT plan of care cert/re-cert  Localized edema - Plan: PT plan of care cert/re-cert     Problem List Patient Active Problem List   Diagnosis Date Noted  . Tobacco use 12/10/2020  . Mixed hyperlipidemia 12/10/2020  . History of throat cancer 12/10/2020  . S/p left hip fracture 12/10/2020  . Hyponatremia 12/04/2020  . Essential hypertension 12/04/2020  . Hip fracture ([place]) 12/04/2020  . Closed left hip fracture ([place]) 12/03/2020    Juel Burrow, PT, DPT 03/03/2021, 1:50 PM  [address]. [place], [place], [address] Phone: [phone]   Fax:  [phone]  Name: Sergio Chavez MRN: [id number] Date of Birth: 26-Sep-1941

## 2021-03-15 ENCOUNTER — Ambulatory Visit: Payer: Medicare Other | Attending: Orthopedic Surgery | Admitting: Rehabilitative and Restorative Service Providers"

## 2021-03-15 ENCOUNTER — Encounter: Payer: Self-pay | Admitting: Rehabilitative and Restorative Service Providers"

## 2021-03-15 ENCOUNTER — Other Ambulatory Visit: Payer: Self-pay

## 2021-03-15 DIAGNOSIS — R262 Difficulty in walking, not elsewhere classified: Secondary | ICD-10-CM | POA: Diagnosis present

## 2021-03-15 DIAGNOSIS — R600 Localized edema: Secondary | ICD-10-CM | POA: Diagnosis present

## 2021-03-15 DIAGNOSIS — M25551 Pain in right hip: Secondary | ICD-10-CM | POA: Diagnosis present

## 2021-03-15 DIAGNOSIS — M25552 Pain in left hip: Secondary | ICD-10-CM | POA: Insufficient documentation

## 2021-03-15 DIAGNOSIS — M6281 Muscle weakness (generalized): Secondary | ICD-10-CM

## 2021-03-15 NOTE — Therapy (Signed)
[place]. [place], [place], [address] Phone: [phone]   Fax:  [phone]  Physical Therapy Treatment  Patient Details  Name: Sergio Chavez MRN: [id number] Date of Birth: 1941/02/10 Referring Provider (PT): Ainsley Spinner, PA-C   Encounter Date: 03/15/2021   PT End of Session - 03/15/21 1152    Visit Number 2    Date for PT Re-Evaluation 05/20/21    PT Start Time 1660    PT Stop Time 1225    PT Time Calculation (min) 40 min           Past Medical History:  Diagnosis Date  . Hypertension   . Throat cancer [facility])     Past Surgical History:  Procedure Laterality Date  . BACK SURGERY    . HIP ARTHROPLASTY Left 12/04/2020   Procedure: HIP HEMIARTHROPLASTY;  Surgeon: Altamese Madill, MD;  Location: [place];  Service: Orthopedics;  Laterality: Left;  . PROSTATE SURGERY      There were no vitals filed for this visit.   Subjective Assessment - 03/15/21 1150    Subjective I am doing okay today, no pain this morning    Pertinent History Hx of back surgery    Patient Stated Goals To walk without a cane as he did before.    Currently in Pain? Yes    Pain Score 3     Pain Location Hip    Pain Orientation Left    Pain Descriptors / Indicators Aching                             [facility] Adult PT Treatment/Exercise - 03/15/21 0001      High Level Balance   High Level Balance Comments Standing on AirEx with eyes open and eyes closed, standing on AirEx performong ball toss      Knee/Hip Exercises: Aerobic   Nustep L3 x6 min      Knee/Hip Exercises: Standing   Heel Raises Both;2 sets;10 reps    Lateral Step Up Both;2 sets;10 reps;Hand Hold: 1;Step Height: 4"    Forward Step Up Both;2 sets;10 reps;Hand Hold: 2;Step Height: 4"    Step Down Both;1 set;10 reps;Hand Hold: 2;Step Height: 4"      Knee/Hip Exercises: Seated   Long Arc Quad Strengthening;Both;2 sets;10 reps    Long Arc Quad Weight 3  lbs.    Clamshell with TheraBand Red   2x10   Marching Strengthening;Both;2 sets;10 reps    Marching Weights 3 lbs.    Hamstring Curl Strengthening;Both;2 sets;10 reps    Hamstring Limitations red theraband    Sit to Sand 10 reps;with UE support                    PT Short Term Goals - 03/15/21 1231      PT SHORT TERM GOAL #1   Title Pt will be independent with initial HEP.    Status Partially Met             PT Long Term Goals - 03/15/21 1231      PT LONG TERM GOAL #1   Title Pt will be independent with advanced HEP.    Status On-going      PT LONG TERM GOAL #2   Title Patient will increase L hip and knee strength to at least 31mnus to 5/5 to allow him to ambulate without device.    Status On-going  PT LONG TERM GOAL #3   Title Patient will decrease TUG to less than 14 seconds without device to place him at a low risk of falling.    Status On-going      PT LONG TERM GOAL #4   Title Pt will return to his previous ambulation routine without assistive device and no increase in pain.    Status On-going                 Plan - 03/15/21 1223    Clinical Impression Statement Patient tolerated session well with minor complaints of fatigue.  He had the most difficulty with step up exercise, especially lateral requiring cuing to step straight out instead of back/diagonal.  He continues to require [facility] with ambulation, with one instance of L hip 'giving out' during ambulation in clinic, but pt was able to self correct with his cane.  He did not have a complaint of increased pain during session.    PT Treatment/Interventions ADLs/Self Care Home Management;Cryotherapy;Electrical Stimulation;Iontophoresis 21m/ml Dexamethasone;Moist Heat;Ultrasound;Gait training;Stair training;Therapeutic activities;Functional mobility training;Therapeutic exercise;Balance training;Neuromuscular re-education;Patient/family education;Manual techniques;Scar mobilization;Passive range of  motion;Dry needling;Energy conservation    PT Next Visit Plan Progress strengthening, ambulation, balance    Consulted and Agree with Plan of Care Patient;Family member/caregiver           Patient will benefit from skilled therapeutic intervention in order to improve the following deficits and impairments:  Decreased balance,Difficulty walking,Decreased activity tolerance,Decreased strength,Pain  Visit Diagnosis: Pain in right hip  Muscle weakness (generalized)  Difficulty in walking, not elsewhere classified  Localized edema     Problem List Patient Active Problem List   Diagnosis Date Noted  . Tobacco use 12/10/2020  . Mixed hyperlipidemia 12/10/2020  . History of throat cancer 12/10/2020  . S/p left hip fracture 12/10/2020  . Hyponatremia 12/04/2020  . Essential hypertension 12/04/2020  . [place] 12/04/2020  . Closed left hip fracture ([place] 12/03/2020    SJuel Burrow PT, DPT 03/15/2021, 12:33 PM  [place] [address]Phone: [phone]  Fax:  [phone] Name: Sergio CORTESEMRN: [id number] of Birth: 202/12/1940

## 2021-03-17 ENCOUNTER — Encounter: Payer: Self-pay | Admitting: Rehabilitative and Restorative Service Providers"

## 2021-03-17 ENCOUNTER — Other Ambulatory Visit: Payer: Self-pay

## 2021-03-17 ENCOUNTER — Ambulatory Visit: Payer: Medicare Other | Admitting: Rehabilitative and Restorative Service Providers"

## 2021-03-17 DIAGNOSIS — M6281 Muscle weakness (generalized): Secondary | ICD-10-CM

## 2021-03-17 DIAGNOSIS — M25551 Pain in right hip: Secondary | ICD-10-CM | POA: Diagnosis not present

## 2021-03-17 DIAGNOSIS — R600 Localized edema: Secondary | ICD-10-CM

## 2021-03-17 DIAGNOSIS — R262 Difficulty in walking, not elsewhere classified: Secondary | ICD-10-CM

## 2021-03-17 NOTE — Therapy (Signed)
[place]. [place], [place], [address] Phone: [phone]   Fax:  [phone]  Physical Therapy Treatment  Patient Details  Name: Sergio Chavez MRN: [id number] Date of Birth: 26-Jul-1941 Referring Provider (PT): Ainsley Spinner, PA-C   Encounter Date: 03/17/2021   PT End of Session - 03/17/21 0850     Visit Number 3    Date for PT Re-Evaluation 05/20/21    PT Start Time 0845    PT Stop Time 0925    PT Time Calculation (min) 40 min    Activity Tolerance Patient tolerated treatment well    Behavior During Therapy [facility] for tasks assessed/performed             Past Medical History:  Diagnosis Date   Hypertension    Throat cancer [facility])     Past Surgical History:  Procedure Laterality Date   BACK SURGERY     HIP ARTHROPLASTY Left 12/04/2020   Procedure: HIP HEMIARTHROPLASTY;  Surgeon: Altamese , MD;  Location: [place];  Service: Orthopedics;  Laterality: Left;   PROSTATE SURGERY      There were no vitals filed for this visit.   Subjective Assessment - 03/17/21 0850     Subjective I am ready to get rid of this cane    Pertinent History Hx of back surgery    Patient Stated Goals To walk without a cane as he did before.    Currently in Pain? Yes    Pain Score 2     Pain Location Hip    Pain Orientation Left    Pain Descriptors / Indicators Aching                               [facility] Adult PT Treatment/Exercise - 03/17/21 0001       Knee/Hip Exercises: Aerobic   Nustep L4 x6 min      Knee/Hip Exercises: Standing   Heel Raises Both;2 sets;10 reps    Lateral Step Up Both;2 sets;10 reps;Hand Hold: 1;Step Height: 4"    Forward Step Up Both;2 sets;10 reps;Hand Hold: 2;Step Height: 4"   with alt LE extension   Step Down Both;1 set;10 reps;Hand Hold: 2;Step Height: 4"      Knee/Hip Exercises: Seated   Long Arc Quad Strengthening;Both;2 sets;10 reps    Long Arc Quad Weight 5 lbs.     Clamshell with TheraBand Green   2x10   Marching Strengthening;Both;2 sets;10 reps    Marching Weights 5 lbs.    Hamstring Curl Strengthening;Both;2 sets;10 reps    Hamstring Limitations green theraband    Sit to Sand 20 reps;with UE support   x10 pushing up from mat, x10 pushing up from thighs                     PT Short Term Goals - 03/17/21 0936       PT SHORT TERM GOAL #1   Title Pt will be independent with initial HEP.    Status Achieved               PT Long Term Goals - 03/17/21 0936       PT LONG TERM GOAL #1   Title Pt will be independent with advanced HEP.    Status On-going                   Plan - 03/17/21  0927     Clinical Impression Statement Patient was able to tolerate increased weights and theraband during session today.  He reports feeling increased fatigue with step up with alternate hip extension.  He overall is reporting feeling better and is progressing towards goal related activities.    PT Treatment/Interventions ADLs/Self Care Home Management;Cryotherapy;Electrical Stimulation;Iontophoresis 4mg /ml Dexamethasone;Moist Heat;Ultrasound;Gait training;Stair training;Therapeutic activities;Functional mobility training;Therapeutic exercise;Balance training;Neuromuscular re-education;Patient/family education;Manual techniques;Scar mobilization;Passive range of motion;Dry needling;Energy conservation    PT Next Visit Plan Progress strengthening, ambulation, balance    Consulted and Agree with Plan of Care Patient             Patient will benefit from skilled therapeutic intervention in order to improve the following deficits and impairments:  Decreased balance, Difficulty walking, Decreased activity tolerance, Decreased strength, Pain  Visit Diagnosis: Pain in right hip  Muscle weakness (generalized)  Difficulty in walking, not elsewhere classified  Localized edema     Problem List Patient Active Problem List    Diagnosis Date Noted   Tobacco use 12/10/2020   Mixed hyperlipidemia 12/10/2020   History of throat cancer 12/10/2020   S/p left hip fracture 12/10/2020   Hyponatremia 12/04/2020   Essential hypertension 12/04/2020   Hip fracture ([place]) 12/04/2020   Closed left hip fracture ([place]) 12/03/2020    Juel Burrow, PT, DPT 03/17/2021, 9:39 AM  [place]. [place], [place], [address] Phone: [phone]   Fax:  [phone]  Name: Sergio Chavez MRN: [id number] Date of Birth: June 09, 1941

## 2021-03-22 ENCOUNTER — Other Ambulatory Visit: Payer: Self-pay

## 2021-03-22 ENCOUNTER — Encounter: Payer: Self-pay | Admitting: Rehabilitative and Restorative Service Providers"

## 2021-03-22 ENCOUNTER — Ambulatory Visit: Payer: Medicare Other | Admitting: Rehabilitative and Restorative Service Providers"

## 2021-03-22 DIAGNOSIS — M25551 Pain in right hip: Secondary | ICD-10-CM

## 2021-03-22 DIAGNOSIS — M6281 Muscle weakness (generalized): Secondary | ICD-10-CM

## 2021-03-22 DIAGNOSIS — R262 Difficulty in walking, not elsewhere classified: Secondary | ICD-10-CM

## 2021-03-22 NOTE — Therapy (Signed)
[place]. [place], [place], [address] Phone: [phone]   Fax:  [phone]  Physical Therapy Treatment  Patient Details  Name: Sergio Chavez MRN: [id number] Date of Birth: 1941/09/16 Referring Provider (PT): Ainsley Spinner, PA-C   Encounter Date: 03/22/2021   PT End of Session - 03/22/21 1207     Visit Number 4    Date for PT Re-Evaluation 05/20/21    PT Start Time 6767    PT Stop Time 1230    PT Time Calculation (min) 45 min    Activity Tolerance Patient tolerated treatment well    Behavior During Therapy [facility] for tasks assessed/performed             Past Medical History:  Diagnosis Date   Hypertension    Throat cancer [facility])     Past Surgical History:  Procedure Laterality Date   BACK SURGERY     HIP ARTHROPLASTY Left 12/04/2020   Procedure: HIP HEMIARTHROPLASTY;  Surgeon: Altamese Wilton, MD;  Location: [place];  Service: Orthopedics;  Laterality: Left;   PROSTATE SURGERY      There were no vitals filed for this visit.                      [facility] Adult PT Treatment/Exercise - 03/22/21 0001       Ambulation/Gait   Ambulation/Gait Yes    Ambulation/Gait Assistance 6: Modified independent (Device/Increase time)    Ambulation Distance (Feet) 100 Feet    Assistive device Straight cane    Gait Pattern Antalgic;Trunk flexed    Ambulation Surface Level;Indoor      Knee/Hip Exercises: Aerobic   Nustep L4 x6 min      Knee/Hip Exercises: Machines for Strengthening   Cybex Knee Extension 10# 2x10    Cybex Knee Flexion 15# 2x10      Knee/Hip Exercises: Standing   Lateral Step Up Both;2 sets;10 reps;Hand Hold: 1;Step Height: 4"    Forward Step Up Both;2 sets;10 reps;Hand Hold: 2;Step Height: 4"    Forward Step Up Limitations with alt LE extension    Step Down Both;1 set;10 reps;Hand Hold: 2;Step Height: 4"      Knee/Hip Exercises: Seated   Long Arc Quad Strengthening;Both;2  sets;10 reps    Long Arc Quad Weight 5 lbs.    Clamshell with TheraBand Red    Marching Strengthening;Both;2 sets;10 reps    Marching Weights 5 lbs.    Hamstring Curl Strengthening;Both;2 sets;10 reps    Hamstring Limitations red tband    Sit to Sand 20 reps;with UE support                      PT Short Term Goals - 03/17/21 0936       PT SHORT TERM GOAL #1   Title Pt will be independent with initial HEP.    Status Achieved               PT Long Term Goals - 03/22/21 1234       PT LONG TERM GOAL #1   Title Pt will be independent with advanced HEP.    Status On-going      PT LONG TERM GOAL #2   Title Patient will increase L hip and knee strength to at least 73minus to 5/5 to allow him to ambulate without device.    Status On-going      PT LONG TERM GOAL #3  Title Patient will decrease TUG to less than 14 seconds without device to place him at a low risk of falling.    Status On-going      PT LONG TERM GOAL #4   Title Pt will return to his previous ambulation routine without assistive device and no increase in pain.    Status On-going                   Plan - 03/22/21 1232     Clinical Impression Statement Patient continues to make progress towards goal related acitivies.  Pt was able to add Cybex for knee strengthening today, but reported fatigue at the end of the session.  He continues with weakness, but is able to progress towards strengthening exercises with cuing for pacing throughout.  He continues to have antalgic gait pattern and requires use of SPC for improved safety and stability.    PT Treatment/Interventions ADLs/Self Care Home Management;Cryotherapy;Electrical Stimulation;Iontophoresis 4mg /ml Dexamethasone;Moist Heat;Ultrasound;Gait training;Stair training;Therapeutic activities;Functional mobility training;Therapeutic exercise;Balance training;Neuromuscular re-education;Patient/family education;Manual techniques;Scar  mobilization;Passive range of motion;Dry needling;Energy conservation    PT Next Visit Plan Progress strengthening, ambulation, balance    Consulted and Agree with Plan of Care Patient             Patient will benefit from skilled therapeutic intervention in order to improve the following deficits and impairments:  Decreased balance, Difficulty walking, Decreased activity tolerance, Decreased strength, Pain  Visit Diagnosis: Pain in right hip  Muscle weakness (generalized)  Difficulty in walking, not elsewhere classified     Problem List Patient Active Problem List   Diagnosis Date Noted   Tobacco use 12/10/2020   Mixed hyperlipidemia 12/10/2020   History of throat cancer 12/10/2020   S/p left hip fracture 12/10/2020   Hyponatremia 12/04/2020   Essential hypertension 12/04/2020   Hip fracture ([place]) 12/04/2020   Closed left hip fracture ([place]) 12/03/2020    Juel Burrow, PT, DPT 03/22/2021, 12:41 PM  [place]. [place], [place], [address] Phone: [phone]   Fax:  [phone]  Name: Sergio Chavez MRN: [id number] Date of Birth: 1941-03-04

## 2021-03-24 ENCOUNTER — Ambulatory Visit: Payer: Medicare Other | Admitting: Rehabilitative and Restorative Service Providers"

## 2021-03-24 ENCOUNTER — Encounter: Payer: Self-pay | Admitting: Rehabilitative and Restorative Service Providers"

## 2021-03-24 ENCOUNTER — Other Ambulatory Visit: Payer: Self-pay

## 2021-03-24 DIAGNOSIS — M6281 Muscle weakness (generalized): Secondary | ICD-10-CM

## 2021-03-24 DIAGNOSIS — M25551 Pain in right hip: Secondary | ICD-10-CM | POA: Diagnosis not present

## 2021-03-24 DIAGNOSIS — R600 Localized edema: Secondary | ICD-10-CM

## 2021-03-24 DIAGNOSIS — R262 Difficulty in walking, not elsewhere classified: Secondary | ICD-10-CM

## 2021-03-24 NOTE — Therapy (Signed)
[place]. [place], [place], [address] Phone: [phone]   Fax:  [phone]  Physical Therapy Treatment  Patient Details  Name: Sergio Chavez MRN: [id number] Date of Birth: 30-Dec-1940 Referring Provider (PT): Ainsley Spinner, PA-C   Encounter Date: 03/24/2021   PT End of Session - 03/24/21 1147     Visit Number 5    Date for PT Re-Evaluation 05/20/21    PT Start Time 3007    PT Stop Time 1227    PT Time Calculation (min) 43 min    Activity Tolerance Patient tolerated treatment well    Behavior During Therapy [facility] for tasks assessed/performed             Past Medical History:  Diagnosis Date   Hypertension    Throat cancer [facility])     Past Surgical History:  Procedure Laterality Date   BACK SURGERY     HIP ARTHROPLASTY Left 12/04/2020   Procedure: HIP HEMIARTHROPLASTY;  Surgeon: Altamese Bethany, MD;  Location: [place];  Service: Orthopedics;  Laterality: Left;   PROSTATE SURGERY      There were no vitals filed for this visit.   Subjective Assessment - 03/24/21 1146     Subjective I am doing better than I was last time I left here.    Patient Stated Goals To walk without a cane as he did before.    Currently in Pain? Yes    Pain Score 2     Pain Location Hip    Pain Orientation Left    Pain Descriptors / Indicators Aching                               [facility] Adult PT Treatment/Exercise - 03/24/21 0001       Knee/Hip Exercises: Aerobic   Recumbent Bike L2.0 x6 min      Knee/Hip Exercises: Machines for Strengthening   Cybex Knee Extension 10# 2x10    Cybex Knee Flexion 15# 2x10      Knee/Hip Exercises: Standing   Heel Raises Both;1 set;10 reps    Hip Flexion Stengthening;Both;1 set;10 reps    Hip Flexion Limitations red theraband    Hip ADduction Strengthening;Both;1 set;10 reps    Hip ADduction Limitations red theraband    Hip Extension Stengthening;Both;1 set;10 reps     Extension Limitations red theraband    Lateral Step Up Both;1 set;10 reps;Hand Hold: 2;Step Height: 4"    Lateral Step Up Limitations with alt LE abduction    Forward Step Up Both;1 set;10 reps;Hand Hold: 2;Step Height: 4"    Forward Step Up Limitations with alt LE extension    Step Down Both;1 set;10 reps;Hand Hold: 2;Step Height: 4"      Knee/Hip Exercises: Seated   Long Arc Quad Strengthening;Both;2 sets;10 reps    Long Arc Quad Weight 5 lbs.    Clamshell with TheraBand Green   2x10   Marching Strengthening;Both;2 sets;10 reps    Marching Weights 5 lbs.    Hamstring Curl Strengthening;Both;2 sets;10 reps    Hamstring Limitations green tband    Sit to Sand 20 reps;with UE support   pushing up from thighs sitting on black mat                     PT Short Term Goals - 03/17/21 0936       PT SHORT TERM GOAL #1  Title Pt will be independent with initial HEP.    Status Achieved               PT Long Term Goals - 03/22/21 1234       PT LONG TERM GOAL #1   Title Pt will be independent with advanced HEP.    Status On-going      PT LONG TERM GOAL #2   Title Patient will increase L hip and knee strength to at least 77minus to 5/5 to allow him to ambulate without device.    Status On-going      PT LONG TERM GOAL #3   Title Patient will decrease TUG to less than 14 seconds without device to place him at a low risk of falling.    Status On-going      PT LONG TERM GOAL #4   Title Pt will return to his previous ambulation routine without assistive device and no increase in pain.    Status On-going                   Plan - 03/24/21 1228     Clinical Impression Statement Patient tolerated session well and without complaints of increased pain, only fatigue.  He had some difficulty with standing 3 way hip with red theraband, but was able to complete with holding onto parallel bars for stability.  He continues to require use of SPC with ambulation  secondary to hip weakness from Fx and ORIF.  He continues to require skilled PT to progress towards goal related activities.    PT Treatment/Interventions ADLs/Self Care Home Management;Cryotherapy;Electrical Stimulation;Iontophoresis 4mg /ml Dexamethasone;Moist Heat;Ultrasound;Gait training;Stair training;Therapeutic activities;Functional mobility training;Therapeutic exercise;Balance training;Neuromuscular re-education;Patient/family education;Manual techniques;Scar mobilization;Passive range of motion;Dry needling;Energy conservation    PT Next Visit Plan Progress strengthening, ambulation, balance    Consulted and Agree with Plan of Care Patient             Patient will benefit from skilled therapeutic intervention in order to improve the following deficits and impairments:  Decreased balance, Difficulty walking, Decreased activity tolerance, Decreased strength, Pain  Visit Diagnosis: Pain in right hip  Muscle weakness (generalized)  Difficulty in walking, not elsewhere classified  Localized edema     Problem List Patient Active Problem List   Diagnosis Date Noted   Tobacco use 12/10/2020   Mixed hyperlipidemia 12/10/2020   History of throat cancer 12/10/2020   S/p left hip fracture 12/10/2020   Hyponatremia 12/04/2020   Essential hypertension 12/04/2020   Hip fracture ([place]) 12/04/2020   Closed left hip fracture ([place]) 12/03/2020    Juel Burrow, PT, DPT 03/24/2021, 12:32 PM  [place]. [place], [place], [address] Phone: [phone]   Fax:  [phone]  Name: Sergio Chavez MRN: [id number] Date of Birth: 04-Jun-1941

## 2021-03-29 ENCOUNTER — Encounter: Payer: Self-pay | Admitting: Rehabilitative and Restorative Service Providers"

## 2021-03-29 ENCOUNTER — Ambulatory Visit: Payer: Medicare Other | Admitting: Rehabilitative and Restorative Service Providers"

## 2021-03-29 ENCOUNTER — Other Ambulatory Visit: Payer: Self-pay

## 2021-03-29 DIAGNOSIS — M25551 Pain in right hip: Secondary | ICD-10-CM | POA: Diagnosis not present

## 2021-03-29 DIAGNOSIS — R262 Difficulty in walking, not elsewhere classified: Secondary | ICD-10-CM

## 2021-03-29 DIAGNOSIS — R600 Localized edema: Secondary | ICD-10-CM

## 2021-03-29 DIAGNOSIS — M6281 Muscle weakness (generalized): Secondary | ICD-10-CM

## 2021-03-29 NOTE — Therapy (Signed)
[place]. [place], [place], [address] Phone: [phone]   Fax:  [phone]  Physical Therapy Treatment  Patient Details  Name: Sergio Chavez MRN: [id number] Date of Birth: 13-Jun-1941 Referring Provider (PT): Ainsley Spinner, PA-C   Encounter Date: 03/29/2021   PT End of Session - 03/29/21 1145     Visit Number 6    Date for PT Re-Evaluation 05/20/21    PT Start Time 1143    PT Stop Time 1225    PT Time Calculation (min) 42 min    Activity Tolerance Patient tolerated treatment well    Behavior During Therapy [facility] for tasks assessed/performed             Past Medical History:  Diagnosis Date   Hypertension    Throat cancer [facility])     Past Surgical History:  Procedure Laterality Date   BACK SURGERY     HIP ARTHROPLASTY Left 12/04/2020   Procedure: HIP HEMIARTHROPLASTY;  Surgeon: Altamese [place], MD;  Location: [place];  Service: Orthopedics;  Laterality: Left;   PROSTATE SURGERY      There were no vitals filed for this visit.   Subjective Assessment - 03/29/21 1144     Subjective I am doing okay    Patient Stated Goals To walk without a cane as he did before.    Currently in Pain? Yes    Pain Score 1     Pain Location Hip    Pain Orientation Left    Pain Descriptors / Indicators Aching                               [facility] Adult PT Treatment/Exercise - 03/29/21 0001       Ambulation/Gait   Ambulation/Gait Yes    Ambulation/Gait Assistance 6: Modified independent (Device/Increase time)    Ambulation Distance (Feet) 100 Feet    Assistive device Straight cane    Gait Pattern Antalgic;Trunk flexed    Ambulation Surface Level;Indoor      Knee/Hip Exercises: Aerobic   Nustep L5 x6 min      Knee/Hip Exercises: Machines for Strengthening   Cybex Knee Extension 10# 2x10    Cybex Knee Flexion 20# 2x10    Cybex Leg Press 20# 2x10      Knee/Hip Exercises: Standing   Heel Raises  Both;2 sets;10 reps    Heel Raises Limitations 5#    Hip Flexion Stengthening;Both;2 sets;10 reps    Hip Flexion Limitations 5#    Hip Abduction Stengthening;Both;2 sets;10 reps    Abduction Limitations 5#    Hip Extension Stengthening;Both;2 sets;10 reps    Extension Limitations 5#    Lateral Step Up Both;1 set;10 reps;Hand Hold: 2;Step Height: 6"    Forward Step Up Both;1 set;10 reps;Hand Hold: 2;Step Height: 6"      Knee/Hip Exercises: Seated   Long Arc Quad Strengthening;Both;2 sets;10 reps    Long Arc Quad Weight 5 lbs.    Clamshell with TheraBand Green   2x10   Hamstring Curl Strengthening;Both;2 sets;10 reps    Hamstring Limitations green tband    Sit to Sand 20 reps;with UE support   pushing up from thighs                     PT Short Term Goals - 03/17/21 0936       PT SHORT TERM GOAL #1   Title  Pt will be independent with initial HEP.    Status Achieved               PT Long Term Goals - 03/22/21 1234       PT LONG TERM GOAL #1   Title Pt will be independent with advanced HEP.    Status On-going      PT LONG TERM GOAL #2   Title Patient will increase L hip and knee strength to at least 21minus to 5/5 to allow him to ambulate without device.    Status On-going      PT LONG TERM GOAL #3   Title Patient will decrease TUG to less than 14 seconds without device to place him at a low risk of falling.    Status On-going      PT LONG TERM GOAL #4   Title Pt will return to his previous ambulation routine without assistive device and no increase in pain.    Status On-going                   Plan - 03/29/21 1230     Clinical Impression Statement Patient is progressing towards goal related activities and was able to tolerate increased exercises today.  He did report some muscle fatigue/discomfort following step up on 6 inch step compared to 4 inch step.  He did not require as much effort with sit to stand with pusing up from his thighs  instead of mat.  He continues to have antalgic gait pattern with ambulation with SPC.    PT Treatment/Interventions ADLs/Self Care Home Management;Cryotherapy;Electrical Stimulation;Iontophoresis 4mg /ml Dexamethasone;Moist Heat;Ultrasound;Gait training;Stair training;Therapeutic activities;Functional mobility training;Therapeutic exercise;Balance training;Neuromuscular re-education;Patient/family education;Manual techniques;Scar mobilization;Passive range of motion;Dry needling;Energy conservation    PT Next Visit Plan 5 times sit to/from stand, TUG, Progress strengthening, ambulation, balance    Consulted and Agree with Plan of Care Patient             Patient will benefit from skilled therapeutic intervention in order to improve the following deficits and impairments:  Decreased balance, Difficulty walking, Decreased activity tolerance, Decreased strength, Pain  Visit Diagnosis: Pain in right hip  Muscle weakness (generalized)  Difficulty in walking, not elsewhere classified  Localized edema     Problem List Patient Active Problem List   Diagnosis Date Noted   Tobacco use 12/10/2020   Mixed hyperlipidemia 12/10/2020   History of throat cancer 12/10/2020   S/p left hip fracture 12/10/2020   Hyponatremia 12/04/2020   Essential hypertension 12/04/2020   Hip fracture ([place]) 12/04/2020   Closed left hip fracture ([place]) 12/03/2020    Sergio Chavez, PT, DPT 03/29/2021, 12:36 PM  [place]. [place], [place], [address] Phone: [phone]   Fax:  [phone]  Name: Sergio Chavez MRN: [id number] Date of Birth: 12/21/1940

## 2021-03-31 ENCOUNTER — Ambulatory Visit: Payer: Medicare Other | Admitting: Rehabilitative and Restorative Service Providers"

## 2021-03-31 ENCOUNTER — Encounter: Payer: Self-pay | Admitting: Rehabilitative and Restorative Service Providers"

## 2021-03-31 ENCOUNTER — Other Ambulatory Visit: Payer: Self-pay

## 2021-03-31 DIAGNOSIS — R600 Localized edema: Secondary | ICD-10-CM

## 2021-03-31 DIAGNOSIS — R262 Difficulty in walking, not elsewhere classified: Secondary | ICD-10-CM

## 2021-03-31 DIAGNOSIS — M6281 Muscle weakness (generalized): Secondary | ICD-10-CM

## 2021-03-31 DIAGNOSIS — M25551 Pain in right hip: Secondary | ICD-10-CM | POA: Diagnosis not present

## 2021-03-31 DIAGNOSIS — M25552 Pain in left hip: Secondary | ICD-10-CM

## 2021-03-31 NOTE — Therapy (Signed)
[place] [address]. [place], [place], [address] Phone: [phone]   Fax:  [phone]  Physical Therapy Treatment  Patient Details  Name: Sergio Chavez MRN: [id number] Date of Birth: 10/31/1940 Referring Provider (PT): Ainsley Spinner, PA-C   Encounter Date: 03/31/2021   PT End of Session - 03/31/21 1226     Visit Number 7    Date for PT Re-Evaluation 05/20/21    PT Start Time 1143    PT Stop Time 1221    PT Time Calculation (min) 38 min    Activity Tolerance Patient tolerated treatment well    Behavior During Therapy [facility] for tasks assessed/performed             Past Medical History:  Diagnosis Date   Hypertension    Throat cancer [facility]     Past Surgical History:  Procedure Laterality Date   BACK SURGERY     HIP ARTHROPLASTY Left 12/04/2020   Procedure: HIP HEMIARTHROPLASTY;  Surgeon: Altamese [place], MD;  Location: [place];  Service: Orthopedics;  Laterality: Left;   PROSTATE SURGERY      There were no vitals filed for this visit.   Subjective Assessment - 03/31/21 1226     Subjective My leg was hurting me last night, I'm not sure why, but it feels better now.    Patient Stated Goals To walk without a cane as he did before.    Currently in Pain? Yes    Pain Score 1     Pain Location Hip    Pain Orientation Left    Pain Descriptors / Indicators Aching    Pain Type Surgical pain                               [facility] Adult PT Treatment/Exercise - 03/31/21 0001       Ambulation/Gait   Ambulation/Gait Yes    Ambulation/Gait Assistance 4: Min assist    Ambulation Distance (Feet) 250 Feet    Assistive device Straight cane    Gait Pattern Antalgic;Trunk flexed    Ambulation Surface Outdoor;Paved    Curb 4: Min assist    Curb Details (indicate cue type and reason) Pt requires CGA with first 3 curbs, with 4th curb pt required min A    Gait Comments Pt initially ambulated with mod I, but  as he fatigued, he required min A with one self corrected LOB.  Pt has increased forward flexed posture as he fatigues.      Knee/Hip Exercises: Aerobic   Nustep L5 x6 min      Knee/Hip Exercises: Machines for Strengthening   Cybex Knee Extension 10# 2x10    Cybex Knee Flexion 20# 2x10    Cybex Leg Press 20# 2x10      Knee/Hip Exercises: Seated   Sit to Sand 20 reps;with UE support                      PT Short Term Goals - 03/17/21 0936       PT SHORT TERM GOAL #1   Title Pt will be independent with initial HEP.    Status Achieved               PT Long Term Goals - 03/31/21 1231       PT LONG TERM GOAL #1   Title Pt will be independent with advanced HEP.  Status On-going      PT LONG TERM GOAL #2   Title Patient will increase L hip and knee strength to at least 78minus to 5/5 to allow him to ambulate without device.    Status On-going      PT LONG TERM GOAL #3   Title Patient will decrease TUG to less than 14 seconds without device to place him at a low risk of falling.    Status On-going      PT LONG TERM GOAL #4   Title Pt will return to his previous ambulation routine without assistive device and no increase in pain.    Status On-going                   Plan - 03/31/21 1228     Clinical Impression Statement Patient with increased fatigue following ambulation outside, requiring min A with last curb and with ambulation to inside clinic on walkway.  He had increased forward flexed posture and decreased foot clearance as he fatigues.  He was able to progress with remaining treatment with minimal recovery periods following ambulation.  He continues to progress with increased L hip strength and activity tolerance.    PT Treatment/Interventions ADLs/Self Care Home Management;Cryotherapy;Electrical Stimulation;Iontophoresis 4mg /ml Dexamethasone;Moist Heat;Ultrasound;Gait training;Stair training;Therapeutic activities;Functional mobility  training;Therapeutic exercise;Balance training;Neuromuscular re-education;Patient/family education;Manual techniques;Scar mobilization;Passive range of motion;Dry needling;Energy conservation    PT Next Visit Plan 5 times sit to/from stand, TUG, Progress strengthening, ambulation, balance    Consulted and Agree with Plan of Care Patient             Patient will benefit from skilled therapeutic intervention in order to improve the following deficits and impairments:  Decreased balance, Difficulty walking, Decreased activity tolerance, Decreased strength, Pain  Visit Diagnosis: Muscle weakness (generalized)  Difficulty in walking, not elsewhere classified  Localized edema  Pain in left hip     Problem List Patient Active Problem List   Diagnosis Date Noted   Tobacco use 12/10/2020   Mixed hyperlipidemia 12/10/2020   History of throat cancer 12/10/2020   S/p left hip fracture 12/10/2020   Hyponatremia 12/04/2020   Essential hypertension 12/04/2020   Hip fracture ([place]) 12/04/2020   Closed left hip fracture ([place]) 12/03/2020    Juel Burrow, PT, DPT 03/31/2021, 12:36 PM  [place]. [place], [place], [address] Phone: [phone]   Fax:  [phone]  Name: Sergio Chavez MRN: [id number] Date of Birth: June 15, 1941

## 2021-04-05 ENCOUNTER — Ambulatory Visit: Payer: Medicare Other | Admitting: Rehabilitative and Restorative Service Providers"

## 2021-04-05 ENCOUNTER — Other Ambulatory Visit: Payer: Self-pay

## 2021-04-05 ENCOUNTER — Encounter: Payer: Self-pay | Admitting: Rehabilitative and Restorative Service Providers"

## 2021-04-05 DIAGNOSIS — R600 Localized edema: Secondary | ICD-10-CM

## 2021-04-05 DIAGNOSIS — M6281 Muscle weakness (generalized): Secondary | ICD-10-CM

## 2021-04-05 DIAGNOSIS — R262 Difficulty in walking, not elsewhere classified: Secondary | ICD-10-CM

## 2021-04-05 DIAGNOSIS — M25552 Pain in left hip: Secondary | ICD-10-CM

## 2021-04-05 DIAGNOSIS — M25551 Pain in right hip: Secondary | ICD-10-CM | POA: Diagnosis not present

## 2021-04-05 NOTE — Therapy (Signed)
[place]. [place], [place], [address] Phone: [phone]   Fax:  [phone]  Physical Therapy Treatment  Patient Details  Name: Sergio Chavez MRN: [id number] Date of Birth: 04/04/1941 Referring Provider (PT): Ainsley Spinner, PA-C   Encounter Date: 04/05/2021   PT End of Session - 04/05/21 1235     Visit Number 8    Date for PT Re-Evaluation 05/20/21    PT Start Time 6256    PT Stop Time 1225    PT Time Calculation (min) 40 min    Activity Tolerance Patient tolerated treatment well    Behavior During Therapy [facility] for tasks assessed/performed             Past Medical History:  Diagnosis Date   Hypertension    Throat cancer [facility])     Past Surgical History:  Procedure Laterality Date   BACK SURGERY     HIP ARTHROPLASTY Left 12/04/2020   Procedure: HIP HEMIARTHROPLASTY;  Surgeon: Altamese [place], MD;  Location: [place];  Service: Orthopedics;  Laterality: Left;   PROSTATE SURGERY      There were no vitals filed for this visit.   Subjective Assessment - 04/05/21 1233     Subjective I am having a rough day, I was able to walk some without my cane before, but I am unable to do that today.    Patient Stated Goals To walk without a cane as he did before.    Currently in Pain? Yes    Pain Score 2     Pain Location Hip    Pain Orientation Left    Pain Descriptors / Indicators Aching                               [facility] Adult PT Treatment/Exercise - 04/05/21 0001       Transfers   Five time sit to stand comments  11.3 sec with UE use      Ambulation/Gait   Ambulation/Gait Yes    Ambulation/Gait Assistance 5: Supervision    Ambulation Distance (Feet) 150 Feet    Assistive device Straight cane    Gait Pattern Antalgic;Trunk flexed    Ambulation Surface Level;Indoor      High Level Balance   High Level Balance Comments Standing on AirEx feet semi apart, then semi-tandem.  Weight  shifts lateral and forward back in each position      Knee/Hip Exercises: Aerobic   Nustep L5 x6 min      Knee/Hip Exercises: Machines for Strengthening   Cybex Leg Press 30# 2x10, LLE 2x10 with no weight      Knee/Hip Exercises: Standing   Lateral Step Up Both;1 set;10 reps;Hand Hold: 2    Lateral Step Up Limitations onto foam mat over and back    Forward Step Up Both;1 set;10 reps;Hand Hold: 2    Forward Step Up Limitations onto airex      Knee/Hip Exercises: Seated   Long Arc Quad Strengthening;Both;2 sets;10 reps    Long Arc Quad Weight 5 lbs.    Marching Strengthening;Both;2 sets;10 reps    Marching Limitations x10 without green band, x10 with pushing down against green tband    Sit to Sand 10 reps;with UE support                      PT Short Term Goals - 03/17/21 3893  PT SHORT TERM GOAL #1   Title Pt will be independent with initial HEP.    Status Achieved               PT Long Term Goals - 04/05/21 1239       PT LONG TERM GOAL #1   Title Pt will be independent with advanced HEP.    Status On-going      PT LONG TERM GOAL #2   Title Patient will increase L hip and knee strength to at least 10minus to 5/5 to allow him to ambulate without device.    Status On-going      PT LONG TERM GOAL #3   Title Patient will decrease TUG to less than 14 seconds without device to place him at a low risk of falling.    Status On-going      PT LONG TERM GOAL #4   Title Pt will return to his previous ambulation routine without assistive device and no increase in pain.    Status On-going                   Plan - 04/05/21 1236     Clinical Impression Statement 5 times sit to/from stand has improved since last time measuring, but continues to require UE use with sit to stand. Focused session on more weight bearing activities today to encourage increased weight bearing through LLE.  During ambulation, provided cuing to increase weight bearing  through LLE and decreased use of SPC.  Educated pt and wife about the importance of bearing weight through LLE during ambulation, both verbalized understanding.    PT Treatment/Interventions ADLs/Self Care Home Management;Cryotherapy;Electrical Stimulation;Iontophoresis 4mg /ml Dexamethasone;Moist Heat;Ultrasound;Gait training;Stair training;Therapeutic activities;Functional mobility training;Therapeutic exercise;Balance training;Neuromuscular re-education;Patient/family education;Manual techniques;Scar mobilization;Passive range of motion;Dry needling;Energy conservation    PT Next Visit Plan TUG, Progress strengthening, ambulation, balance    Consulted and Agree with Plan of Care Patient             Patient will benefit from skilled therapeutic intervention in order to improve the following deficits and impairments:  Decreased balance, Difficulty walking, Decreased activity tolerance, Decreased strength, Pain  Visit Diagnosis: Muscle weakness (generalized)  Difficulty in walking, not elsewhere classified  Localized edema  Pain in left hip     Problem List Patient Active Problem List   Diagnosis Date Noted   Tobacco use 12/10/2020   Mixed hyperlipidemia 12/10/2020   History of throat cancer 12/10/2020   S/p left hip fracture 12/10/2020   Hyponatremia 12/04/2020   Essential hypertension 12/04/2020   Hip fracture ([place]) 12/04/2020   Closed left hip fracture ([place]) 12/03/2020    Juel Burrow, PT, DPT 04/05/2021, 12:40 PM  [place]. [place], [place], [address] Phone: [phone]   Fax:  [phone]  Name: Sergio Chavez MRN: [id number] Date of Birth: 02-24-1941

## 2021-04-07 ENCOUNTER — Other Ambulatory Visit: Payer: Self-pay

## 2021-04-07 ENCOUNTER — Ambulatory Visit: Payer: Medicare Other | Admitting: Rehabilitative and Restorative Service Providers"

## 2021-04-07 ENCOUNTER — Encounter: Payer: Self-pay | Admitting: Rehabilitative and Restorative Service Providers"

## 2021-04-07 DIAGNOSIS — R262 Difficulty in walking, not elsewhere classified: Secondary | ICD-10-CM

## 2021-04-07 DIAGNOSIS — M25552 Pain in left hip: Secondary | ICD-10-CM

## 2021-04-07 DIAGNOSIS — R600 Localized edema: Secondary | ICD-10-CM

## 2021-04-07 DIAGNOSIS — M6281 Muscle weakness (generalized): Secondary | ICD-10-CM

## 2021-04-07 DIAGNOSIS — M25551 Pain in right hip: Secondary | ICD-10-CM | POA: Diagnosis not present

## 2021-04-07 NOTE — Therapy (Signed)
[address] [place], [place], [address] Phone: [phone]   Fax:  [phone]  Physical Therapy Treatment  Patient Details  Name: Sergio Chavez MRN: [id number] Date of Birth: March 27, 1941 Referring Provider (PT): Ainsley Spinner, PA-C   Encounter Date: 04/07/2021   PT End of Session - 04/07/21 1227     Visit Number 9    Date for PT Re-Evaluation 05/20/21    PT Start Time 8299    PT Stop Time 1225    PT Time Calculation (min) 40 min    Activity Tolerance Patient tolerated treatment well    Behavior During Therapy [facility] for tasks assessed/performed             Past Medical History:  Diagnosis Date   Hypertension    Throat cancer [facility])     Past Surgical History:  Procedure Laterality Date   BACK SURGERY     HIP ARTHROPLASTY Left 12/04/2020   Procedure: HIP HEMIARTHROPLASTY;  Surgeon: Altamese [place], MD;  Location: [place];  Service: Orthopedics;  Laterality: Left;   PROSTATE SURGERY      There were no vitals filed for this visit.   Subjective Assessment - 04/07/21 1219     Subjective I think that I am starting to feel better.    Patient Stated Goals To walk without a cane as he did before.    Currently in Pain? Yes    Pain Score 1     Pain Location Hip    Pain Orientation Left                               [facility] Adult PT Treatment/Exercise - 04/07/21 0001       Ambulation/Gait   Ambulation/Gait Yes    Ambulation/Gait Assistance 5: Supervision    Ambulation Distance (Feet) 150 Feet   focus on even weight bearing into LLE.   Assistive device Straight cane    Gait Pattern Antalgic;Trunk flexed    Ambulation Surface Level;Indoor      High Level Balance   High Level Balance Comments Standing on AirEx feet semi apart, then semi-tandem.  Weight shifts lateral and forward back in each position      Knee/Hip Exercises: Aerobic   Nustep L5 x6 min      Knee/Hip Exercises: Machines  for Strengthening   Cybex Leg Press 30# 2x10, LLE 2x10 with no weight      Knee/Hip Exercises: Standing   Lateral Step Up Both;1 set;10 reps;Hand Hold: 2    Lateral Step Up Limitations onto foam mat over and back    Forward Step Up Both;1 set;10 reps;Hand Hold: 2    Forward Step Up Limitations onto airex      Knee/Hip Exercises: Seated   Long Arc Quad Strengthening;Both;2 sets;10 reps    Long Arc Quad Weight 5 lbs.    Clamshell with TheraBand Green   2x10   Marching Strengthening;Both;2 sets;10 reps    Marching Limitations x10 without green band, x10 with pushing down against green tband    Hamstring Curl Strengthening;Both;2 sets;10 reps    Hamstring Limitations green tband                      PT Short Term Goals - 03/17/21 0936       PT SHORT TERM GOAL #1   Title Pt will be independent with initial HEP.  Status Achieved               PT Long Term Goals - 04/05/21 1239       PT LONG TERM GOAL #1   Title Pt will be independent with advanced HEP.    Status On-going      PT LONG TERM GOAL #2   Title Patient will increase L hip and knee strength to at least 46minus to 5/5 to allow him to ambulate without device.    Status On-going      PT LONG TERM GOAL #3   Title Patient will decrease TUG to less than 14 seconds without device to place him at a low risk of falling.    Status On-going      PT LONG TERM GOAL #4   Title Pt will return to his previous ambulation routine without assistive device and no increase in pain.    Status On-going                   Plan - 04/07/21 1227     Clinical Impression Statement Patient requires cuing, but able to bear increased weight through LLE during ambulation for a more even gait pattern.  He continues with increased weakness through LLE, but is progressing.  He continues with good motivation throughout session.    PT Treatment/Interventions ADLs/Self Care Home Management;Cryotherapy;Electrical  Stimulation;Iontophoresis 4mg /ml Dexamethasone;Moist Heat;Ultrasound;Gait training;Stair training;Therapeutic activities;Functional mobility training;Therapeutic exercise;Balance training;Neuromuscular re-education;Patient/family education;Manual techniques;Scar mobilization;Passive range of motion;Dry needling;Energy conservation    PT Next Visit Plan TUG, Progress strengthening, ambulation, balance    Consulted and Agree with Plan of Care Patient             Patient will benefit from skilled therapeutic intervention in order to improve the following deficits and impairments:  Decreased balance, Difficulty walking, Decreased activity tolerance, Decreased strength, Pain  Visit Diagnosis: Muscle weakness (generalized)  Difficulty in walking, not elsewhere classified  Localized edema  Pain in left hip     Problem List Patient Active Problem List   Diagnosis Date Noted   Tobacco use 12/10/2020   Mixed hyperlipidemia 12/10/2020   History of throat cancer 12/10/2020   S/p left hip fracture 12/10/2020   Hyponatremia 12/04/2020   Essential hypertension 12/04/2020   Hip fracture ([place]) 12/04/2020   Closed left hip fracture ([place]) 12/03/2020    Sergio Chavez, PT, DPT 04/07/2021, 12:29 PM  [place]. [place], [place], [address] Phone: [phone]   Fax:  [phone]  Name: Sergio Chavez MRN: [id number] Date of Birth: 10-07-1941

## 2021-04-12 ENCOUNTER — Ambulatory Visit: Payer: Medicare Other | Attending: Orthopedic Surgery | Admitting: Rehabilitative and Restorative Service Providers"

## 2021-04-12 ENCOUNTER — Other Ambulatory Visit: Payer: Self-pay

## 2021-04-12 ENCOUNTER — Encounter: Payer: Self-pay | Admitting: Rehabilitative and Restorative Service Providers"

## 2021-04-12 DIAGNOSIS — M25551 Pain in right hip: Secondary | ICD-10-CM | POA: Insufficient documentation

## 2021-04-12 DIAGNOSIS — M6281 Muscle weakness (generalized): Secondary | ICD-10-CM | POA: Diagnosis present

## 2021-04-12 DIAGNOSIS — R600 Localized edema: Secondary | ICD-10-CM | POA: Diagnosis present

## 2021-04-12 DIAGNOSIS — M25552 Pain in left hip: Secondary | ICD-10-CM | POA: Diagnosis not present

## 2021-04-12 DIAGNOSIS — R262 Difficulty in walking, not elsewhere classified: Secondary | ICD-10-CM | POA: Diagnosis present

## 2021-04-12 NOTE — Therapy (Signed)
[place]. [place], [place], [address] Phone: [phone]   Fax:  [phone]  Physical Therapy Treatment  Patient Details  Name: Sergio Chavez MRN: [id number] Date of Birth: 1941-03-29 Referring Provider (PT): Ainsley Spinner, PA-C  Progress Note Reporting Period 03/03/2021 to 04/12/2021  See note below for Objective Data and Assessment of Progress/Goals.      Encounter Date: 04/12/2021   PT End of Session - 04/12/21 1228     Visit Number 10    Date for PT Re-Evaluation 05/20/21    PT Start Time 2094    PT Stop Time 1225    PT Time Calculation (min) 40 min    Activity Tolerance Patient tolerated treatment well    Behavior During Therapy [facility] for tasks assessed/performed             Past Medical History:  Diagnosis Date   Hypertension    Throat cancer [facility])     Past Surgical History:  Procedure Laterality Date   BACK SURGERY     HIP ARTHROPLASTY Left 12/04/2020   Procedure: HIP HEMIARTHROPLASTY;  Surgeon: Altamese [place], MD;  Location: [place];  Service: Orthopedics;  Laterality: Left;   PROSTATE SURGERY      There were no vitals filed for this visit.   Subjective Assessment - 04/12/21 1158     Subjective My hip is hurting again today    Patient Stated Goals To walk without a cane as he did before.    Currently in Pain? Yes    Pain Score 2     Pain Location Hip    Pain Orientation Left                [facility] PT Assessment - 04/12/21 0001       ROM / Strength   AROM / PROM / Strength Strength      Strength   Overall Strength Deficits    Overall Strength Comments L hip and knee strength of 8minus/5 grossly throughout                           [facility] Adult PT Treatment/Exercise - 04/12/21 0001       Knee/Hip Exercises: Stretches   Hip Flexor Stretch Left;3 reps;20 seconds    Hip Flexor Stretch Limitations Modified Thomas in supine    Gastroc Stretch Both;2 reps;20  seconds      Knee/Hip Exercises: Aerobic   Nustep L5 x6 min      Knee/Hip Exercises: Standing   Heel Raises Both;2 sets;10 reps      Knee/Hip Exercises: Seated   Long Arc Quad Strengthening;Both;2 sets;10 reps    Long Arc Quad Weight 5 lbs.    Marching Strengthening;Both;2 sets;10 reps    Marching Limitations 5#    Hamstring Curl Strengthening;Both;2 sets;10 reps    Hamstring Limitations green tband    Abduction/Adduction  Strengthening;Both;2 sets;10 reps    Abd/Adduction Weights 5 lbs.    Sit to Sand 10 reps;with UE support      Knee/Hip Exercises: Supine   Bridges Both;2 sets;10 reps    Bridges with [employer] Both;2 sets;10 reps    Straight Leg Raises Strengthening;Left;2 sets;10 reps    Straight Leg Raises Limitations with CGA      Knee/Hip Exercises: Sidelying   Hip ABduction Strengthening;Left;2 sets;10 reps    Hip ABduction Limitations with CGA  PT Short Term Goals - 03/17/21 0936       PT SHORT TERM GOAL #1   Title Pt will be independent with initial HEP.    Status Achieved               PT Long Term Goals - 04/12/21 1303       PT LONG TERM GOAL #1   Title Pt will be independent with advanced HEP.    Status On-going      PT LONG TERM GOAL #2   Title Patient will increase L hip and knee strength to at least 47minus to 5/5 to allow him to ambulate without device.    Status On-going      PT LONG TERM GOAL #3   Title Patient will decrease TUG to less than 14 seconds without device to place him at a low risk of falling.    Status On-going      PT LONG TERM GOAL #4   Title Pt will return to his previous ambulation routine without assistive device and no increase in pain.    Status On-going                   Plan - 04/12/21 1230     Clinical Impression Statement Patient continues to progress towards goal related activities and LLE strengthening.  Worked on supine exercises to isolate hip strengthening as  well as work weight bearing in bridging.  Pt with tight L hip flexor and added modified Thomas stretch on mat to assist.  Pt continues with good participation and motivation during session and encouraged pt to increase his ambulation each day at home to progress with functional strengthening, he verbalized his understanding.  He continues to require skilled PT to further progress towards goal related activities.    PT Treatment/Interventions ADLs/Self Care Home Management;Cryotherapy;Electrical Stimulation;Iontophoresis 4mg /ml Dexamethasone;Moist Heat;Ultrasound;Gait training;Stair training;Therapeutic activities;Functional mobility training;Therapeutic exercise;Balance training;Neuromuscular re-education;Patient/family education;Manual techniques;Scar mobilization;Passive range of motion;Dry needling;Energy conservation    PT Next Visit Plan Progress strengthening, ambulation, balance    Consulted and Agree with Plan of Care Patient             Patient will benefit from skilled therapeutic intervention in order to improve the following deficits and impairments:  Decreased balance, Difficulty walking, Decreased activity tolerance, Decreased strength, Pain  Visit Diagnosis: Pain in left hip  Muscle weakness (generalized)  Difficulty in walking, not elsewhere classified  Localized edema     Problem List Patient Active Problem List   Diagnosis Date Noted   Tobacco use 12/10/2020   Mixed hyperlipidemia 12/10/2020   History of throat cancer 12/10/2020   S/p left hip fracture 12/10/2020   Hyponatremia 12/04/2020   Essential hypertension 12/04/2020   Hip fracture ([place]) 12/04/2020   Closed left hip fracture ([place]) 12/03/2020    Juel Burrow, PT, DPT 04/12/2021, 1:36 PM  [place]. [place], [place], [address] Phone: [phone]   Fax:  [phone]  Name: Sergio Chavez MRN: [id number] Date of Birth:  1940/11/02

## 2021-04-14 ENCOUNTER — Other Ambulatory Visit: Payer: Self-pay

## 2021-04-14 ENCOUNTER — Encounter: Payer: Self-pay | Admitting: Rehabilitative and Restorative Service Providers"

## 2021-04-14 ENCOUNTER — Ambulatory Visit: Payer: Medicare Other | Admitting: Rehabilitative and Restorative Service Providers"

## 2021-04-14 DIAGNOSIS — M25552 Pain in left hip: Secondary | ICD-10-CM | POA: Diagnosis not present

## 2021-04-14 DIAGNOSIS — M6281 Muscle weakness (generalized): Secondary | ICD-10-CM

## 2021-04-14 DIAGNOSIS — R262 Difficulty in walking, not elsewhere classified: Secondary | ICD-10-CM

## 2021-04-14 NOTE — Therapy (Signed)
[place]. [place], [place], [address] Phone: [phone]   Fax:  [phone]  Physical Therapy Treatment  Patient Details  Name: Sergio Chavez MRN: [id number] Date of Birth: [birth year] Referring Provider (PT): Sergio Spinner, PA-C   Encounter Date: 04/14/2021   PT End of Session - 04/14/21 1153     Visit Number 11    Date for PT Re-Evaluation 05/20/21    PT Start Time 2353    PT Stop Time 1225    PT Time Calculation (min) 40 min    Activity Tolerance Patient tolerated treatment well    Behavior During Therapy [facility] for tasks assessed/performed             Past Medical History:  Diagnosis Date   Hypertension    Throat cancer [facility])     Past Surgical History:  Procedure Laterality Date   BACK SURGERY     HIP ARTHROPLASTY Left 12/04/2020   Procedure: HIP HEMIARTHROPLASTY;  Surgeon: Sergio [place], MD;  Location: [place];  Service: Orthopedics;  Laterality: Left;   PROSTATE SURGERY      There were no vitals filed for this visit.   Subjective Assessment - 04/14/21 1152     Subjective I tried to mow my lawn this morning, I got the mower stuck.  My neighbor is going to get it.    Patient Stated Goals To walk without a cane as he did before.    Currently in Pain? Yes    Pain Score 2     Pain Location Hip    Pain Orientation Left    Pain Descriptors / Indicators Aching                               [facility] Adult PT Treatment/Exercise - 04/14/21 0001       Ambulation/Gait   Ambulation/Gait Yes    Ambulation/Gait Assistance 5: Supervision    Ambulation Distance (Feet) 150 Feet    Assistive device Straight cane    Gait Pattern Antalgic;Trunk flexed    Ambulation Surface Level;Indoor      Knee/Hip Exercises: Aerobic   Nustep L5 x6 min      Knee/Hip Exercises: Seated   Long Arc Quad Strengthening;Both;2 sets;10 reps    Long Arc Quad Weight 5 lbs.    Marching Strengthening;Both;2  sets;10 reps    Marching Limitations 5#    Hamstring Curl Strengthening;Both;2 sets;10 reps    Hamstring Limitations green tband    Abduction/Adduction  Strengthening;Both;2 sets;10 reps    Abd/Adduction Weights 5 lbs.    Sit to Sand 20 reps;with UE support   RLE standing on dynadisc to increase weight bearing through LLE.  Unable to complete full stand.     Knee/Hip Exercises: Supine   Bridges Both;2 sets;10 reps    Bridges with [employer] Both;2 sets;10 reps    Straight Leg Raises Strengthening;Left;2 sets;10 reps    Straight Leg Raises Limitations with CGA      Knee/Hip Exercises: Sidelying   Hip ABduction Strengthening;Left;2 sets;10 reps    Hip ABduction Limitations with CGA    Hip ADduction Strengthening;Left;2 sets;10 reps    Hip ADduction Limitations with CGA      Knee/Hip Exercises: Prone   Hamstring Curl 20 reps                      PT Short Term Goals -  03/17/21 0936       PT SHORT TERM GOAL #1   Title Pt will be independent with initial HEP.    Status Achieved               PT Long Term Goals - 04/12/21 1303       PT LONG TERM GOAL #1   Title Pt will be independent with advanced HEP.    Status On-going      PT LONG TERM GOAL #2   Title Patient will increase L hip and knee strength to at least 25minus to 5/5 to allow him to ambulate without device.    Status On-going      PT LONG TERM GOAL #3   Title Patient will decrease TUG to less than 14 seconds without device to place him at a low risk of falling.    Status On-going      PT LONG TERM GOAL #4   Title Pt will return to his previous ambulation routine without assistive device and no increase in pain.    Status On-going                   Plan - 04/14/21 1247     Clinical Impression Statement Pt continues to progress with goal related activities.  [name] has been able to start mowing his smaller lawn some with his riding lawn mower.  He required less assistance with SLR  in various positions today.  He does require CGA at times with ambulation when he is fatigued, especially following PT session.  He continues to require skilled PT to progress towards goal related activities.    PT Treatment/Interventions ADLs/Self Care Home Management;Cryotherapy;Electrical Stimulation;Iontophoresis 4mg /ml Dexamethasone;Moist Heat;Ultrasound;Gait training;Stair training;Therapeutic activities;Functional mobility training;Therapeutic exercise;Balance training;Neuromuscular re-education;Patient/family education;Manual techniques;Scar mobilization;Passive range of motion;Dry needling;Energy conservation    PT Next Visit Plan Progress strengthening, ambulation, balance    Consulted and Agree with Plan of Care Patient             Patient will benefit from skilled therapeutic intervention in order to improve the following deficits and impairments:  Decreased balance, Difficulty walking, Decreased activity tolerance, Decreased strength, Pain  Visit Diagnosis: Pain in left hip  Muscle weakness (generalized)  Difficulty in walking, not elsewhere classified     Problem List Patient Active Problem List   Diagnosis Date Noted   Tobacco use 12/10/2020   Mixed hyperlipidemia 12/10/2020   History of throat cancer 12/10/2020   S/p left hip fracture 12/10/2020   Hyponatremia 12/04/2020   Essential hypertension 12/04/2020   Hip fracture ([place]) 12/04/2020   Closed left hip fracture ([place]) 12/03/2020    Sergio Chavez, PT, DPT 04/14/2021, 12:54 PM  [place]. [place], [place], [address] Phone: [phone]   Fax:  [phone]  Name: Sergio Chavez MRN: [id number] Date of Birth: 02-Sep-1941

## 2021-04-18 ENCOUNTER — Encounter: Payer: Self-pay | Admitting: Physical Therapy

## 2021-04-18 ENCOUNTER — Ambulatory Visit: Payer: Medicare Other | Admitting: Physical Therapy

## 2021-04-18 ENCOUNTER — Other Ambulatory Visit: Payer: Self-pay

## 2021-04-18 DIAGNOSIS — R600 Localized edema: Secondary | ICD-10-CM

## 2021-04-18 DIAGNOSIS — R262 Difficulty in walking, not elsewhere classified: Secondary | ICD-10-CM

## 2021-04-18 DIAGNOSIS — M25551 Pain in right hip: Secondary | ICD-10-CM

## 2021-04-18 DIAGNOSIS — M6281 Muscle weakness (generalized): Secondary | ICD-10-CM

## 2021-04-18 DIAGNOSIS — M25552 Pain in left hip: Secondary | ICD-10-CM | POA: Diagnosis not present

## 2021-04-18 NOTE — Therapy (Signed)
[place]. [place], [place], [address] Phone: [phone]   Fax:  [phone]  Physical Therapy Treatment  Patient Details  Name: Sergio Chavez MRN: [id number] Date of Birth: 04-20-1941 Referring Provider (PT): Sergio Spinner, PA-C   Encounter Date: 04/18/2021   PT End of Session - 04/18/21 1650     Visit Number 12    Date for PT Re-Evaluation 05/20/21    PT Start Time 1559    PT Stop Time 1640    PT Time Calculation (min) 41 min    Activity Tolerance Patient tolerated treatment well    Behavior During Therapy [facility] for tasks assessed/performed             Past Medical History:  Diagnosis Date   Hypertension    Throat cancer [facility])     Past Surgical History:  Procedure Laterality Date   BACK SURGERY     HIP ARTHROPLASTY Left 12/04/2020   Procedure: HIP HEMIARTHROPLASTY;  Surgeon: Altamese [place], MD;  Location: [place];  Service: Orthopedics;  Laterality: Left;   PROSTATE SURGERY      There were no vitals filed for this visit.   Subjective Assessment - 04/18/21 1602     Subjective Pt reports hes "done too much today and his leg is hurting." Hes been getting his car inspected, mowing, grocery shopping, and other errands.    Currently in Pain? Yes    Pain Score 2     Pain Location Hip    Pain Orientation Left                               [facility] Adult PT Treatment/Exercise - 04/18/21 0001       Ambulation/Gait   Ambulation/Gait Yes    Ambulation/Gait Assistance 5: Supervision    Assistive device Straight cane    Gait Pattern Antalgic;Trunk flexed    Ambulation Surface Outdoor;Unlevel    Gait Comments Pt initially ambulated with mod I, but as he fatigued, he required min A with one LOB when starting to go up a hill with a slant to his R.  Pt has increased forward flexed posture as he fatigues.      High Level Balance   High Level Balance Comments semitandem, narrow on level and  on airex, heel raises      Knee/Hip Exercises: Aerobic   Nustep L5 x 68min      Knee/Hip Exercises: Standing   Other Standing Knee Exercises marching 2# 2x10      Knee/Hip Exercises: Seated   Long Arc Quad Strengthening;Both;2 sets;10 reps    Long Arc Quad Weight 5 lbs.    Clamshell with [employer] Green   2x10   Hamstring Curl Strengthening;Both;2 sets   x12   Hamstring Limitations green tband    Sit to Sand with UE support;3 sets;5 reps                      PT Short Term Goals - 03/17/21 0936       PT SHORT TERM GOAL #1   Title Pt will be independent with initial HEP.    Status Achieved               PT Long Term Goals - 04/18/21 1649       PT LONG TERM GOAL #1   Title Pt will be independent with advanced HEP.  Time 12    Period Weeks    Status On-going      PT LONG TERM GOAL #2   Title Patient will increase L hip and knee strength to at least 8minus to 5/5 to allow him to ambulate without device.    Time 12    Period Weeks    Status On-going      PT LONG TERM GOAL #3   Title Patient will decrease TUG to less than 14 seconds without device to place him at a low risk of falling.    Time 12    Period Weeks    Status On-going      PT LONG TERM GOAL #4   Title Pt will return to his previous ambulation routine without assistive device and no increase in pain.    Time 12    Period Weeks    Status On-going                   Plan - 04/18/21 1645     Clinical Impression Statement Pt continues to tolerate exercises well. Pt was already fatigued when arriving from treatment from a busy day. He had one LOB while going uphill and increased fatigue walking outside. He continues to have forward flexed posture and required cues to fix and has increased UE pressure into his cane especially as he fatigued. Pt was able to do stanidng marching with decreased stance time on L LE. He also had some LOB while standing up from the mat table.    PT  Treatment/Interventions ADLs/Self Care Home Management;Cryotherapy;Electrical Stimulation;Iontophoresis 4mg /ml Dexamethasone;Moist Heat;Ultrasound;Gait training;Stair training;Therapeutic activities;Functional mobility training;Therapeutic exercise;Balance training;Neuromuscular re-education;Patient/family education;Manual techniques;Scar mobilization;Passive range of motion;Dry needling;Energy conservation    PT Next Visit Plan Progress strengthening, ambulation, balance    PT Home Exercise Plan continue HEP handout from home health             Patient will benefit from skilled therapeutic intervention in order to improve the following deficits and impairments:  Decreased balance, Difficulty walking, Decreased activity tolerance, Decreased strength, Pain  Visit Diagnosis: Pain in left hip  Muscle weakness (generalized)  Difficulty in walking, not elsewhere classified  Localized edema  Pain in right hip     Problem List Patient Active Problem List   Diagnosis Date Noted   Tobacco use 12/10/2020   Mixed hyperlipidemia 12/10/2020   History of throat cancer 12/10/2020   S/p left hip fracture 12/10/2020   Hyponatremia 12/04/2020   Essential hypertension 12/04/2020   Hip fracture ([place]) 12/04/2020   Closed left hip fracture ([place]) 12/03/2020    [name], SPTA 04/18/2021, 4:51 PM  [place]. [place], [place], [address] Phone: [phone]   Fax:  [phone]  Name: Sergio Chavez MRN: [id number] Date of Birth: August 01, 1941

## 2021-04-20 ENCOUNTER — Ambulatory Visit: Payer: Medicare Other | Admitting: Physical Therapy

## 2021-04-26 ENCOUNTER — Ambulatory Visit: Payer: Medicare Other | Admitting: Rehabilitative and Restorative Service Providers"

## 2021-04-26 ENCOUNTER — Encounter: Payer: Self-pay | Admitting: Rehabilitative and Restorative Service Providers"

## 2021-04-26 ENCOUNTER — Other Ambulatory Visit: Payer: Self-pay

## 2021-04-26 DIAGNOSIS — M6281 Muscle weakness (generalized): Secondary | ICD-10-CM

## 2021-04-26 DIAGNOSIS — R262 Difficulty in walking, not elsewhere classified: Secondary | ICD-10-CM

## 2021-04-26 DIAGNOSIS — M25552 Pain in left hip: Secondary | ICD-10-CM | POA: Diagnosis not present

## 2021-04-26 DIAGNOSIS — R600 Localized edema: Secondary | ICD-10-CM

## 2021-04-26 NOTE — Therapy (Signed)
[place]. [place], [place], [address] Phone: [phone]   Fax:  [phone]  Physical Therapy Treatment  Patient Details  Name: Sergio Chavez MRN: [id number] Date of Birth: 18-Aug-1941 Referring Provider (PT): Ainsley Spinner, PA-C   Encounter Date: 04/26/2021   PT End of Session - 04/26/21 1234     Visit Number 13    Date for PT Re-Evaluation 05/20/21    PT Start Time 6734    PT Stop Time 1228    PT Time Calculation (min) 43 min    Activity Tolerance Patient tolerated treatment well    Behavior During Therapy [facility] for tasks assessed/performed             Past Medical History:  Diagnosis Date   Hypertension    Throat cancer [facility])     Past Surgical History:  Procedure Laterality Date   BACK SURGERY     HIP ARTHROPLASTY Left 12/04/2020   Procedure: HIP HEMIARTHROPLASTY;  Surgeon: Altamese [place], MD;  Location: [place];  Service: Orthopedics;  Laterality: Left;   PROSTATE SURGERY      There were no vitals filed for this visit.   Subjective Assessment - 04/26/21 1233     Subjective My hip is still weak.    Currently in Pain? Yes    Pain Score 1     Pain Location Hip    Pain Orientation Left    Pain Descriptors / Indicators Aching                               [facility] Adult PT Treatment/Exercise - 04/26/21 0001       Transfers   Five time sit to stand comments  10 sec with UE use      Ambulation/Gait   Ambulation/Gait Yes    Ambulation/Gait Assistance 5: Supervision    Ambulation Distance (Feet) 180 Feet    Assistive device Straight cane    Gait Pattern Antalgic;Trunk flexed    Gait Comments Pt without a loss of balance today during ambulation.  He did require cuing for increasing foot clearance during ambulation secondary to foot shuffling noted.      Standardized Balance Assessment   Standardized Balance Assessment Timed Up and Go Test      Timed Up and Go Test   TUG  Normal TUG    Normal TUG (seconds) 33.9   with SPC     High Level Balance   High Level Balance Activities Side stepping   with 2.5# ankle weights in parallel bars x10 laps     Knee/Hip Exercises: Aerobic   Recumbent Bike L2.0 x6 min      Knee/Hip Exercises: Standing   Hip Abduction Stengthening;Both;2 sets;10 reps    Abduction Limitations 2.5#    Other Standing Knee Exercises marching 2# 2x10      Knee/Hip Exercises: Seated   Long Arc Quad Strengthening;Both;2 sets;10 reps    Long Arc Quad Weight 5 lbs.    Sit to Sand 2 sets;10 reps;with UE support                      PT Short Term Goals - 03/17/21 0936       PT SHORT TERM GOAL #1   Title Pt will be independent with initial HEP.    Status Achieved  PT Long Term Goals - 04/26/21 1237       PT LONG TERM GOAL #1   Title Pt will be independent with advanced HEP.    Status On-going      PT LONG TERM GOAL #2   Title Patient will increase L hip and knee strength to at least 57minus to 5/5 to allow him to ambulate without device.    Status On-going      PT LONG TERM GOAL #3   Title Patient will decrease TUG to less than 14 seconds without device to place him at a low risk of falling.    Status On-going                   Plan - 04/26/21 1234     Clinical Impression Statement Mr Sayas continues to tolerate TE well.  He has had an improvement with his 5 times sit to/from stand, but he does continue to require UE support to perform.  He requires cuing with side stepping to keep feet pointed straight ahead instead of ER.  He continues to progress with increase strengthening and would benefit from continued skilled PT to progress towards goal related activities.    PT Treatment/Interventions ADLs/Self Care Home Management;Cryotherapy;Electrical Stimulation;Iontophoresis 4mg /ml Dexamethasone;Moist Heat;Ultrasound;Gait training;Stair training;Therapeutic activities;Functional mobility  training;Therapeutic exercise;Balance training;Neuromuscular re-education;Patient/family education;Manual techniques;Scar mobilization;Passive range of motion;Dry needling;Energy conservation    PT Next Visit Plan Progress strengthening, ambulation, balance    Consulted and Agree with Plan of Care Patient             Patient will benefit from skilled therapeutic intervention in order to improve the following deficits and impairments:  Decreased balance, Difficulty walking, Decreased activity tolerance, Decreased strength, Pain  Visit Diagnosis: Pain in left hip  Muscle weakness (generalized)  Difficulty in walking, not elsewhere classified  Localized edema     Problem List Patient Active Problem List   Diagnosis Date Noted   Tobacco use 12/10/2020   Mixed hyperlipidemia 12/10/2020   History of throat cancer 12/10/2020   S/p left hip fracture 12/10/2020   Hyponatremia 12/04/2020   Essential hypertension 12/04/2020   Hip fracture ([place]) 12/04/2020   Closed left hip fracture ([place]) 12/03/2020    Sergio Chavez, PT, DPT 04/26/2021, 1:42 PM  [place]. [place], [place], [address] Phone: [phone]   Fax:  [phone]  Name: Sergio Chavez MRN: [id number] Date of Birth: 1941-04-16

## 2021-04-28 ENCOUNTER — Other Ambulatory Visit: Payer: Self-pay

## 2021-04-28 ENCOUNTER — Ambulatory Visit: Payer: Medicare Other | Admitting: Rehabilitative and Restorative Service Providers"

## 2021-04-28 ENCOUNTER — Encounter: Payer: Self-pay | Admitting: Rehabilitative and Restorative Service Providers"

## 2021-04-28 DIAGNOSIS — M25552 Pain in left hip: Secondary | ICD-10-CM | POA: Diagnosis not present

## 2021-04-28 DIAGNOSIS — M6281 Muscle weakness (generalized): Secondary | ICD-10-CM

## 2021-04-28 DIAGNOSIS — R600 Localized edema: Secondary | ICD-10-CM

## 2021-04-28 DIAGNOSIS — R262 Difficulty in walking, not elsewhere classified: Secondary | ICD-10-CM

## 2021-04-28 NOTE — Therapy (Signed)
[place]. [place], [place], [address] Phone: [phone]   Fax:  [phone]  Physical Therapy Treatment  Patient Details  Name: Sergio Chavez MRN: [id number] Date of Birth: 1941-07-18 Referring Provider (PT): Ainsley Spinner, PA-C   Encounter Date: 04/28/2021   PT End of Session - 04/28/21 1225     Visit Number 14    Date for PT Re-Evaluation 05/20/21    PT Start Time 1143    PT Stop Time 1225    PT Time Calculation (min) 42 min    Activity Tolerance Patient tolerated treatment well    Behavior During Therapy [facility] for tasks assessed/performed             Past Medical History:  Diagnosis Date   Hypertension    Throat cancer [facility])     Past Surgical History:  Procedure Laterality Date   BACK SURGERY     HIP ARTHROPLASTY Left 12/04/2020   Procedure: HIP HEMIARTHROPLASTY;  Surgeon: Altamese Flushing, MD;  Location: [place];  Service: Orthopedics;  Laterality: Left;   PROSTATE SURGERY      There were no vitals filed for this visit.   Subjective Assessment - 04/28/21 1225     Subjective I am having a little pain.    Patient Stated Goals To walk without a cane as he did before.    Currently in Pain? Yes    Pain Score 1     Pain Location Hip    Pain Orientation Left    Pain Descriptors / Indicators Aching    Pain Type Surgical pain                               [facility] Adult PT Treatment/Exercise - 04/28/21 0001       Knee/Hip Exercises: Aerobic   Nustep L5 x 52min      Knee/Hip Exercises: Machines for Strengthening   Cybex Knee Extension 15# 2x10, LLE 5# 2x10    Cybex Knee Flexion 25# 2x10, LLE 20# 2x10    Cybex Leg Press 30# 2x10, LLE 2x10 20# with assist      Knee/Hip Exercises: Standing   Lateral Step Up Left;1 set;5 reps;Hand Hold: 2;Step Height: 4"    Forward Step Up Both;1 set;10 reps;Hand Hold: 2;Step Height: 4"    Other Standing Knee Exercises marching 2# 2x10       Knee/Hip Exercises: Seated   Sit to Sand 2 sets;10 reps;with UE support                      PT Short Term Goals - 03/17/21 0936       PT SHORT TERM GOAL #1   Title Pt will be independent with initial HEP.    Status Achieved               PT Long Term Goals - 04/26/21 1237       PT LONG TERM GOAL #1   Title Pt will be independent with advanced HEP.    Status On-going      PT LONG TERM GOAL #2   Title Patient will increase L hip and knee strength to at least 12minus to 5/5 to allow him to ambulate without device.    Status On-going      PT LONG TERM GOAL #3   Title Patient will decrease TUG to less than 14 seconds without device  to place him at a low risk of falling.    Status On-going                   Plan - 04/28/21 1229     Clinical Impression Statement Mr Carico continues to tolerate PT session well.  Session today focused primarily with strengthening of LLE during todays session.  He had increased muscle fatigue following the session.  He required UE support with toe taps on step and with step up exercise.  Pt with increased difficulty with lateral step up with LLE and only able to complete 5 reps.  He continues to require skilled PT to progress towards goal related activities.    PT Treatment/Interventions ADLs/Self Care Home Management;Cryotherapy;Electrical Stimulation;Iontophoresis 4mg /ml Dexamethasone;Moist Heat;Ultrasound;Gait training;Stair training;Therapeutic activities;Functional mobility training;Therapeutic exercise;Balance training;Neuromuscular re-education;Patient/family education;Manual techniques;Scar mobilization;Passive range of motion;Dry needling;Energy conservation    PT Next Visit Plan Progress strengthening, ambulation, balance             Patient will benefit from skilled therapeutic intervention in order to improve the following deficits and impairments:  Decreased balance, Difficulty walking, Decreased activity  tolerance, Decreased strength, Pain  Visit Diagnosis: Pain in left hip  Muscle weakness (generalized)  Difficulty in walking, not elsewhere classified  Localized edema     Problem List Patient Active Problem List   Diagnosis Date Noted   Tobacco use 12/10/2020   Mixed hyperlipidemia 12/10/2020   History of throat cancer 12/10/2020   S/p left hip fracture 12/10/2020   Hyponatremia 12/04/2020   Essential hypertension 12/04/2020   Hip fracture ([place]) 12/04/2020   Closed left hip fracture ([place]) 12/03/2020    Juel Burrow, PT, DPT 04/28/2021, 12:38 PM  [place]. [place], [place], [address] Phone: [phone]   Fax:  [phone]  Name: Sergio Chavez MRN: [id number] Date of Birth: 1940/10/19

## 2021-05-03 ENCOUNTER — Ambulatory Visit: Payer: Medicare Other | Admitting: Rehabilitative and Restorative Service Providers"

## 2021-05-03 ENCOUNTER — Encounter: Payer: Self-pay | Admitting: Rehabilitative and Restorative Service Providers"

## 2021-05-03 ENCOUNTER — Other Ambulatory Visit: Payer: Self-pay

## 2021-05-03 DIAGNOSIS — R600 Localized edema: Secondary | ICD-10-CM

## 2021-05-03 DIAGNOSIS — M25552 Pain in left hip: Secondary | ICD-10-CM | POA: Diagnosis not present

## 2021-05-03 DIAGNOSIS — M6281 Muscle weakness (generalized): Secondary | ICD-10-CM

## 2021-05-03 DIAGNOSIS — R262 Difficulty in walking, not elsewhere classified: Secondary | ICD-10-CM

## 2021-05-03 NOTE — Therapy (Signed)
[place]. [place], [place], [address] Phone: [phone]   Fax:  [phone]  Physical Therapy Treatment  Patient Details  Name: Sergio Chavez MRN: [id number] Date of Birth: 23-Oct-1940 Referring Provider (PT): Ainsley Spinner, PA-C   Encounter Date: 05/03/2021   PT End of Session - 05/03/21 1149     Visit Number 15    Date for PT Re-Evaluation 05/20/21    PT Start Time [id number]    PT Stop Time 1225    PT Time Calculation (min) 40 min    Activity Tolerance Patient tolerated treatment well    Behavior During Therapy [facility] for tasks assessed/performed             Past Medical History:  Diagnosis Date   Hypertension    Throat cancer [facility])     Past Surgical History:  Procedure Laterality Date   BACK SURGERY     HIP ARTHROPLASTY Left 12/04/2020   Procedure: HIP HEMIARTHROPLASTY;  Surgeon: Altamese [place], MD;  Location: [place];  Service: Orthopedics;  Laterality: Left;   PROSTATE SURGERY      There were no vitals filed for this visit.   Subjective Assessment - 05/03/21 1148     Subjective I am doing okay.    Patient Stated Goals To walk without a cane as he did before.    Currently in Pain? Yes    Pain Score 1     Pain Location Hip    Pain Orientation Left                               [facility] Adult PT Treatment/Exercise - 05/03/21 0001       Ambulation/Gait   Ambulation/Gait Yes    Ambulation/Gait Assistance 6: Modified independent (Device/Increase time)    Ambulation Distance (Feet) 180 Feet   2x180 ft   Assistive device Straight cane    Gait Pattern Antalgic;Trunk flexed    Ambulation Surface Outdoor;Unlevel    Curb 5: Supervision      Standardized Balance Assessment   Standardized Balance Assessment Timed Up and Go Test      Timed Up and Go Test   TUG Normal TUG    Normal TUG (seconds) 22.8   with SPC     Knee/Hip Exercises: Aerobic   Nustep L5 x 50mn      Knee/Hip  Exercises: Standing   Other Standing Knee Exercises marching 2x10   cuing for limited UE support     Knee/Hip Exercises: Seated   Sit to Sand 2 sets;10 reps;with UE support   use UE for standing, no UE for sitting                   PT Education - 05/03/21 1228     Education Details Education on increasing his ambulation during the day when he is at home.  Educated pt that he should be walking in home at least every 2 hours that he is awake. That he should start walking down to the mailbox with his wife daily and increase ambulation and not ride on riding lawn mower down to his shed and back.  He verbalized understanding.    Person(s) Educated Patient    Methods Explanation    Comprehension Verbalized understanding              PT Short Term Goals - 03/17/21 [phone]  PT SHORT TERM GOAL #1   Title Pt will be independent with initial HEP.    Status Achieved               PT Long Term Goals - 05/03/21 1251       PT LONG TERM GOAL #1   Title Pt will be independent with advanced HEP.    Status On-going      PT LONG TERM GOAL #2   Title Patient will increase L hip and knee strength to at least 7mnus to 5/5 to allow him to ambulate without device.    Status On-going      PT LONG TERM GOAL #3   Title Patient will decrease TUG to less than 14 seconds without device to place him at a low risk of falling.    Status On-going      PT LONG TERM GOAL #4   Title Pt will return to his previous ambulation routine without assistive device and no increase in pain.    Status On-going                   Plan - 05/03/21 1231     Clinical Impression Statement Sergio Chavez to progress towards goal related activities.  Recommended pt increase his ambulation at home to encourage increased carryover, as pt admitted that he had been using his riding lawn mower to navigate around his property instead of walking and had not been going to get the mail.  Pt was  wearing better fitting shoes today instead of bedroom slippers and had improved safety with ambulation.  He has improved with his TUG scores.  He continues to require skilled PT to progress towards goal related activities.    PT Treatment/Interventions ADLs/Self Care Home Management;Cryotherapy;Electrical Stimulation;Iontophoresis '4mg'$ /ml Dexamethasone;Moist Heat;Ultrasound;Gait training;Stair training;Therapeutic activities;Functional mobility training;Therapeutic exercise;Balance training;Neuromuscular re-education;Patient/family education;Manual techniques;Scar mobilization;Passive range of motion;Dry needling;Energy conservation    PT Next Visit Plan Progress strengthening, ambulation, balance    Consulted and Agree with Plan of Care Patient             Patient will benefit from skilled therapeutic intervention in order to improve the following deficits and impairments:  Decreased balance, Difficulty walking, Decreased activity tolerance, Decreased strength, Pain  Visit Diagnosis: Pain in left hip  Muscle weakness (generalized)  Difficulty in walking, not elsewhere classified  Localized edema     Problem List Patient Active Problem List   Diagnosis Date Noted   Tobacco use 12/10/2020   Mixed hyperlipidemia 12/10/2020   History of throat cancer 12/10/2020   S/p left hip fracture 12/10/2020   Hyponatremia 12/04/2020   Essential hypertension 12/04/2020   [place] 12/[place]   [place] 12/03[place]    SJuel Burrow PT, DPT 05/03/2021, 12:52 PM  [place] [address]Phone: [phone]  Fax:  [phone] Name: Sergio JOPPMRN: [id number] of Birth: 205-11-1940

## 2021-05-05 ENCOUNTER — Ambulatory Visit: Payer: Medicare Other | Admitting: Rehabilitative and Restorative Service Providers"

## 2021-05-05 ENCOUNTER — Encounter: Payer: Self-pay | Admitting: Rehabilitative and Restorative Service Providers"

## 2021-05-05 ENCOUNTER — Other Ambulatory Visit: Payer: Self-pay

## 2021-05-05 DIAGNOSIS — R262 Difficulty in walking, not elsewhere classified: Secondary | ICD-10-CM

## 2021-05-05 DIAGNOSIS — M25552 Pain in left hip: Secondary | ICD-10-CM

## 2021-05-05 DIAGNOSIS — M6281 Muscle weakness (generalized): Secondary | ICD-10-CM

## 2021-05-05 NOTE — Therapy (Signed)
[place]. [place], [place], [address] Phone: [phone]   Fax:  [phone]  Physical Therapy Treatment  Patient Details  Name: Sergio Chavez MRN: [id number] Date of Birth: 1941/03/15 Referring Provider (PT): Ainsley Spinner, PA-C   Encounter Date: 05/05/2021   PT End of Session - 05/05/21 1212     Visit Number 16    Date for PT Re-Evaluation 05/20/21    PT Start Time 1142    PT Stop Time 1222    PT Time Calculation (min) 40 min    Activity Tolerance Patient tolerated treatment well    Behavior During Therapy [facility] for tasks assessed/performed             Past Medical History:  Diagnosis Date   Hypertension    Throat cancer [facility])     Past Surgical History:  Procedure Laterality Date   BACK SURGERY     HIP ARTHROPLASTY Left 12/04/2020   Procedure: HIP HEMIARTHROPLASTY;  Surgeon: Altamese Ida, MD;  Location: [place];  Service: Orthopedics;  Laterality: Left;   PROSTATE SURGERY      There were no vitals filed for this visit.   Subjective Assessment - 05/05/21 1211     Subjective I have been walking more at home.    Patient Stated Goals To walk without a cane as he did before.    Currently in Pain? Yes    Pain Score 1     Pain Location Hip    Pain Orientation Left                               [facility] Adult PT Treatment/Exercise - 05/05/21 0001       Ambulation/Gait   Ambulation/Gait Yes    Ambulation/Gait Assistance 6: Modified independent (Device/Increase time)    Ambulation Distance (Feet) 180 Feet    Assistive device Straight cane    Pre-Gait Activities Standing in parallel bars without UE support, Then lateral weight shift x1 min without UE support      Knee/Hip Exercises: Aerobic   Nustep L5 x 62mn   LE only     Knee/Hip Exercises: Machines for Strengthening   Cybex Knee Extension 15# 2x10, LLE 5# 2x10    Cybex Knee Flexion 25# 2x10, LLE 20# 2x10    Cybex Leg  Press 30# 2x10, LLE 2x10 20# with assist      Knee/Hip Exercises: Standing   Other Standing Knee Exercises marching 2x10   cuing to limit UE support     Knee/Hip Exercises: Seated   Sit to Sand 2 sets;10 reps;with UE support   Use of UE to stand, then no hands to return to sitting                     PT Short Term Goals - 03/17/21 0936       PT SHORT TERM GOAL #1   Title Pt will be independent with initial HEP.    Status Achieved               PT Long Term Goals - 05/03/21 1251       PT LONG TERM GOAL #1   Title Pt will be independent with advanced HEP.    Status On-going      PT LONG TERM GOAL #2   Title Patient will increase L hip and knee strength to at least 571mus to  5/5 to allow him to ambulate without device.    Status On-going      PT LONG TERM GOAL #3   Title Patient will decrease TUG to less than 14 seconds without device to place him at a low risk of falling.    Status On-going      PT LONG TERM GOAL #4   Title Pt will return to his previous ambulation routine without assistive device and no increase in pain.    Status On-going                   Plan - 05/05/21 1228     Clinical Impression Statement Mr Bendon tolerated treatment session well.  He is working on sessions to increase weight bearing through LLE to allow him to progress towards his goal of ambulating without AD.  He was able to tolerate static stance without UE support with mirror in front to encourage even weight distribution.  He requires encouragement during lateral weight shifts to encourage increased weight bearing through LLE.  Pt would benefit from skilled PT to progress towards goal related activities.    PT Treatment/Interventions ADLs/Self Care Home Management;Cryotherapy;Electrical Stimulation;Iontophoresis '4mg'$ /ml Dexamethasone;Moist Heat;Ultrasound;Gait training;Stair training;Therapeutic activities;Functional mobility training;Therapeutic exercise;Balance  training;Neuromuscular re-education;Patient/family education;Manual techniques;Scar mobilization;Passive range of motion;Dry needling;Energy conservation    PT Next Visit Plan Progress strengthening, ambulation, balance    Consulted and Agree with Plan of Care Patient             Patient will benefit from skilled therapeutic intervention in order to improve the following deficits and impairments:  Decreased balance, Difficulty walking, Decreased activity tolerance, Decreased strength, Pain  Visit Diagnosis: Pain in left hip  Muscle weakness (generalized)  Difficulty in walking, not elsewhere classified     Problem List Patient Active Problem List   Diagnosis Date Noted   Tobacco use 12/10/2020   Mixed hyperlipidemia 12/10/2020   History of throat cancer 12/10/2020   S/p left hip fracture 12/10/2020   Hyponatremia 12/04/2020   Essential hypertension 12/04/2020   Hip fracture ([place]) 12/04/2020   Closed left hip fracture ([place]) 12/03/2020    Juel Burrow, PT, DPT 05/05/2021, 12:31 PM  [place]. [place], [place], [address] Phone: [phone]   Fax:  [phone]  Name: Sergio Chavez MRN: [id number] Date of Birth: 05-22-1941

## 2021-05-10 ENCOUNTER — Ambulatory Visit: Payer: Medicare Other | Attending: Orthopedic Surgery | Admitting: Physical Therapy

## 2021-05-10 ENCOUNTER — Encounter: Payer: Self-pay | Admitting: Physical Therapy

## 2021-05-10 ENCOUNTER — Other Ambulatory Visit: Payer: Self-pay

## 2021-05-10 DIAGNOSIS — M25551 Pain in right hip: Secondary | ICD-10-CM | POA: Diagnosis present

## 2021-05-10 DIAGNOSIS — M25552 Pain in left hip: Secondary | ICD-10-CM | POA: Insufficient documentation

## 2021-05-10 DIAGNOSIS — M6281 Muscle weakness (generalized): Secondary | ICD-10-CM | POA: Diagnosis present

## 2021-05-10 DIAGNOSIS — R600 Localized edema: Secondary | ICD-10-CM | POA: Insufficient documentation

## 2021-05-10 DIAGNOSIS — R262 Difficulty in walking, not elsewhere classified: Secondary | ICD-10-CM | POA: Insufficient documentation

## 2021-05-10 NOTE — Therapy (Signed)
[place]. [place], [place], [address] Phone: [phone]   Fax:  [phone]  Physical Therapy Treatment  Patient Details  Name: Sergio Chavez MRN: [id number] Date of Birth: [birth year] Referring Provider (PT): Ainsley Spinner, PA-C   Encounter Date: 05/10/2021   PT End of Session - 05/10/21 1229     Visit Number 17    Date for PT Re-Evaluation 05/20/21    PT Start Time 1144    PT Stop Time 1227    PT Time Calculation (min) 43 min    Activity Tolerance Patient tolerated treatment well    Behavior During Therapy [facility] for tasks assessed/performed             Past Medical History:  Diagnosis Date   Hypertension    Throat cancer [facility])     Past Surgical History:  Procedure Laterality Date   BACK SURGERY     HIP ARTHROPLASTY Left 12/04/2020   Procedure: HIP HEMIARTHROPLASTY;  Surgeon: Altamese [place], MD;  Location: [place];  Service: Orthopedics;  Laterality: Left;   PROSTATE SURGERY      There were no vitals filed for this visit.   Subjective Assessment - 05/10/21 1146     Subjective "all right" "Just tired"    Currently in Pain? Yes    Pain Score 1     Pain Location Hip    Pain Orientation Left                               [facility] Adult PT Treatment/Exercise - 05/10/21 0001       Ambulation/Gait   Ambulation/Gait Yes    Ambulation/Gait Assistance 4: Min guard    Ambulation Distance (Feet) 20 Feet    Assistive device None    Gait Pattern Decreased arm swing - right;Decreased arm swing - left;Decreased step length - right;Decreased step length - left;Decreased stance time - left;Antalgic;Trunk flexed   LLE externally rotated     Knee/Hip Exercises: Aerobic   Recumbent Bike L2.0 x6 min      Knee/Hip Exercises: Machines for Strengthening   Cybex Leg Press 30# 2x10, LLE 2x5 20# with assist      Knee/Hip Exercises: Standing   Other Standing Knee Exercises Standing march No AD  2x10    Other Standing Knee Exercises Some SLS LLE with HHA x1      Knee/Hip Exercises: Seated   Abduction/Adduction  Both;2 sets;10 reps    Abd/Adduction Limitations manual resistance                      PT Short Term Goals - 03/17/21 0936       PT SHORT TERM GOAL #1   Title Pt will be independent with initial HEP.    Status Achieved               PT Long Term Goals - 05/03/21 1251       PT LONG TERM GOAL #1   Title Pt will be independent with advanced HEP.    Status On-going      PT LONG TERM GOAL #2   Title Patient will increase L hip and knee strength to at least 41mnus to 5/5 to allow him to ambulate without device.    Status On-going      PT LONG TERM GOAL #3   Title Patient will decrease TUG to less than 14  seconds without device to place him at a low risk of falling.    Status On-going      PT LONG TERM GOAL #4   Title Pt will return to his previous ambulation routine without assistive device and no increase in pain.    Status On-going                   Plan - 05/10/21 1220     Clinical Impression Statement Pt enters clinic expressing that he wanted to walk without cane. Progressed to ambulation without AS but with very short steps an decrease stance time on LLE. Pt has very hesitant to bear bodyweight on LLE, used various marching and SLS techniques to get pt bette acclimated it standing on LLE. Manual resistance revealed L hip abduction weakness.    Personal Factors and Comorbidities Age    Examination-Activity Limitations Locomotion Level;Stairs    Examination-Participation Restrictions Community Activity;Yard Work    Stability/Clinical Decision Making Stable/Uncomplicated    Rehab Potential Good    PT Frequency 2x / week    PT Duration 12 weeks    PT Treatment/Interventions ADLs/Self Care Home Management;Cryotherapy;Electrical Stimulation;Iontophoresis '4mg'$ /ml Dexamethasone;Moist Heat;Ultrasound;Gait training;Stair  training;Therapeutic activities;Functional mobility training;Therapeutic exercise;Balance training;Neuromuscular re-education;Patient/family education;Manual techniques;Scar mobilization;Passive range of motion;Dry needling;Energy conservation    PT Next Visit Plan Progress strengthening, ambulation, balance             Patient will benefit from skilled therapeutic intervention in order to improve the following deficits and impairments:  Decreased balance, Difficulty walking, Decreased activity tolerance, Decreased strength, Pain  Visit Diagnosis: Pain in left hip  Muscle weakness (generalized)  Difficulty in walking, not elsewhere classified  Localized edema     Problem List Patient Active Problem List   Diagnosis Date Noted   Tobacco use 12/10/2020   Mixed hyperlipidemia 12/10/2020   History of throat cancer 12/10/2020   S/p left hip fracture 12/10/2020   Hyponatremia 12/04/2020   Essential hypertension 12/04/2020   Hip fracture ([place]) 12/04/2020   Closed left hip fracture ([place]) 12/03/2020    Scot Jun 05/10/2021, 12:30 PM  [place]. [place], [place], [address] Phone: [phone]   Fax:  [phone]  Name: [facility] MRN: [id number] Date of Birth: 10/16/1940

## 2021-05-10 NOTE — Patient Instructions (Signed)
Access Code: [email or web address] URL: [email or web address] Date: 05/10/2021 Prepared by: Cheri Fowler  Exercises Seated Hip Abduction with Resistance - 1 x daily - 7 x weekly - 3 sets - 10 reps Seated Hip Adduction Squeeze with Ball - 1 x daily - 7 x weekly - 3 sets - 10 reps

## 2021-05-12 ENCOUNTER — Ambulatory Visit: Payer: Medicare Other | Admitting: Physical Therapy

## 2021-05-12 ENCOUNTER — Encounter: Payer: Self-pay | Admitting: Physical Therapy

## 2021-05-12 ENCOUNTER — Other Ambulatory Visit: Payer: Self-pay

## 2021-05-12 DIAGNOSIS — M25551 Pain in right hip: Secondary | ICD-10-CM

## 2021-05-12 DIAGNOSIS — M6281 Muscle weakness (generalized): Secondary | ICD-10-CM

## 2021-05-12 DIAGNOSIS — R262 Difficulty in walking, not elsewhere classified: Secondary | ICD-10-CM

## 2021-05-12 DIAGNOSIS — R600 Localized edema: Secondary | ICD-10-CM

## 2021-05-12 DIAGNOSIS — M25552 Pain in left hip: Secondary | ICD-10-CM | POA: Diagnosis not present

## 2021-05-12 NOTE — Therapy (Signed)
[place]. Sergio Chavez, [place], [address] Phone: [phone]   Fax:  [phone]  Physical Therapy Treatment  Patient Details  Name: Sergio Chavez MRN: [id number] Date of Birth: 05-20-1941 Referring Provider (PT): Ainsley Spinner, PA-C   Encounter Date: 05/12/2021   PT End of Session - 05/12/21 1341     Visit Number 18    Date for PT Re-Evaluation 05/20/21    PT Start Time 1300    PT Stop Time 1345    PT Time Calculation (min) 45 min    Activity Tolerance Patient tolerated treatment well    Behavior During Therapy [facility] for tasks assessed/performed             Past Medical History:  Diagnosis Date   Hypertension    Throat cancer [facility])     Past Surgical History:  Procedure Laterality Date   BACK SURGERY     HIP ARTHROPLASTY Left 12/04/2020   Procedure: HIP HEMIARTHROPLASTY;  Surgeon: Altamese [place], MD;  Location: [place];  Service: Orthopedics;  Laterality: Left;   PROSTATE SURGERY      There were no vitals filed for this visit.   Subjective Assessment - 05/12/21 1305     Subjective "So so today"    Currently in Pain? Yes    Pain Score 1     Pain Orientation Left                               [facility] Adult PT Treatment/Exercise - 05/12/21 0001       Ambulation/Gait   Ambulation/Gait Yes    Ambulation/Gait Assistance 4: Min guard    Assistive device None    Gait Pattern Decreased arm swing - right;Decreased arm swing - left;Decreased step length - right;Decreased step length - left;Decreased stance time - left;Antalgic;Trunk flexed    Gait Comments 60f, 614f      High Level Balance   High Level Balance Activities Side stepping;Backward walking      Knee/Hip Exercises: Aerobic   Recumbent Bike L2.0 4 min    Nustep L4 LE only x 4 min      Knee/Hip Exercises: Machines for Strengthening   Cybex Knee Extension LLE 5# 2x10    Cybex Knee Flexion LLE 20# 2x10      Knee/Hip  Exercises: Standing   Other Standing Knee Exercises 4in step ups x 5 each    Other Standing Knee Exercises ALT 2 in box taps x10   unable to do without UE     Knee/Hip Exercises: Seated   Abduction/Adduction  Both;2 sets;10 reps    Abd/Adduction Limitations manual resistance    Sit to Sand 2 sets;10 reps;without UE support   UE on legs                     PT Short Term Goals - 03/17/21 0936       PT SHORT TERM GOAL #1   Title Pt will be independent with initial HEP.    Status Achieved               PT Long Term Goals - 05/03/21 1251       PT LONG TERM GOAL #1   Title Pt will be independent with advanced HEP.    Status On-going      PT LONG TERM GOAL #2   Title Patient will increase L hip  and knee strength to at least 23mnus to 5/5 to allow him to ambulate without device.    Status On-going      PT LONG TERM GOAL #3   Title Patient will decrease TUG to less than 14 seconds without device to place him at a low risk of falling.    Status On-going      PT LONG TERM GOAL #4   Title Pt will return to his previous ambulation routine without assistive device and no increase in pain.    Status On-going                   Plan - 05/12/21 1342     Clinical Impression Statement Good carryover from last session. Pt ambulated around clinic without AD with CGA between interventions. Pt unable to complete box taps and step ups without UE. Difficulty going to his R with side step. Cues to increase step length with RLE during backwards walking. Sit to stands was taxing on PT.    Personal Factors and Comorbidities Age    Examination-Activity Limitations Locomotion Level;Stairs    Examination-Participation Restrictions Community Activity;Yard Work    Stability/Clinical Decision Making Stable/Uncomplicated    Rehab Potential Good    PT Frequency 2x / week    PT Duration 12 weeks    PT Treatment/Interventions ADLs/Self Care Home Management;Cryotherapy;Electrical  Stimulation;Iontophoresis '4mg'$ /ml Dexamethasone;Moist Heat;Ultrasound;Gait training;Stair training;Therapeutic activities;Functional mobility training;Therapeutic exercise;Balance training;Neuromuscular re-education;Patient/family education;Manual techniques;Scar mobilization;Passive range of motion;Dry needling;Energy conservation    PT Next Visit Plan Progress strengthening, ambulation, balance             Patient will benefit from skilled therapeutic intervention in order to improve the following deficits and impairments:  Decreased balance, Difficulty walking, Decreased activity tolerance, Decreased strength, Pain  Visit Diagnosis: Muscle weakness (generalized)  Difficulty in walking, not elsewhere classified  Localized edema  Pain in right hip     Problem List Patient Active Problem List   Diagnosis Date Noted   Tobacco use 12/10/2020   Mixed hyperlipidemia 12/10/2020   History of throat cancer 12/10/2020   S/p left hip fracture 12/10/2020   Hyponatremia 12/04/2020   Essential hypertension 12/04/2020   Hip fracture ([place] 12/04/2020   Closed left hip fracture ([place] 12/03/2020    RScot Jun8/01/2021, 1:46 PM  [place] [address]Phone: [phone]  Fax:  [phone] Name: Sergio BERRIGANMRN: [id number] of Birth: 2Sep 24, 1942

## 2021-05-17 ENCOUNTER — Ambulatory Visit: Payer: Medicare Other | Admitting: Rehabilitative and Restorative Service Providers"

## 2021-05-19 ENCOUNTER — Ambulatory Visit: Payer: Medicare Other | Admitting: Rehabilitative and Restorative Service Providers"

## 2021-05-19 ENCOUNTER — Encounter: Payer: Self-pay | Admitting: Rehabilitative and Restorative Service Providers"

## 2021-05-19 ENCOUNTER — Other Ambulatory Visit: Payer: Self-pay

## 2021-05-19 DIAGNOSIS — M6281 Muscle weakness (generalized): Secondary | ICD-10-CM

## 2021-05-19 DIAGNOSIS — M25552 Pain in left hip: Secondary | ICD-10-CM

## 2021-05-19 DIAGNOSIS — R262 Difficulty in walking, not elsewhere classified: Secondary | ICD-10-CM

## 2021-05-19 NOTE — Therapy (Signed)
[place]. [place], [place], [address] Phone: [phone]   Fax:  [phone]  Physical Therapy Treatment and Recert  Patient Details  Name: Sergio Chavez MRN: [id number] Date of Birth: 1941-04-16 Referring Provider (PT): Ainsley Spinner, PA-C   Encounter Date: 05/19/2021   PT End of Session - 05/19/21 1227     Visit Number 19    Date for PT Re-Evaluation 06/17/21    PT Start Time [id number]    PT Stop Time 1230    PT Time Calculation (min) 45 min    Equipment Utilized During Treatment Gait belt    Activity Tolerance Patient tolerated treatment well    Behavior During Therapy [facility] for tasks assessed/performed             Past Medical History:  Diagnosis Date   Hypertension    Throat cancer [facility])     Past Surgical History:  Procedure Laterality Date   BACK SURGERY     HIP ARTHROPLASTY Left 12/04/2020   Procedure: HIP HEMIARTHROPLASTY;  Surgeon: Altamese [place], MD;  Location: [place];  Service: Orthopedics;  Laterality: Left;   PROSTATE SURGERY      There were no vitals filed for this visit.   Subjective Assessment - 05/19/21 1226     Subjective I am having a hard time lifting my leg to walk.    Currently in Pain? Yes    Pain Score 3     Pain Location Hip    Pain Orientation Left                [facility] PT Assessment - 05/19/21 0001       Assessment   Medical Diagnosis L femoral neck Fx s/p hemiarthoplasty    Referring Provider (PT) Ainsley Spinner, PA-C    Onset Date/Surgical Date 12/03/20      Restrictions   Weight Bearing Restrictions No      [place] residence    Living Arrangements Spouse/significant other    Type of [place]      Prior Function   Level of [place] Retired    Leisure yard work                           [employer] Adult PT Treatment/Exercise - 05/19/21 0001       Ambulation/Gait    Ambulation/Gait Yes    Ambulation/Gait Assistance 4: Min guard    Assistive device None    Gait Pattern Decreased arm swing - right;Decreased arm swing - left;Decreased step length - right;Decreased step length - left;Decreased stance time - left;Antalgic;Trunk flexed    Gait Comments 2 x 75 ft      Standardized Balance Assessment   Standardized Balance Assessment Timed Up and Go Test      Timed Up and Go Test   TUG Normal TUG    Normal TUG (seconds) 91.9   without AD     High Level Balance   High Level Balance Activities Side stepping;Backward walking      Knee/Hip Exercises: Aerobic   Recumbent Bike L2.0 4 min    Nustep L4 LE only x 4 min      Knee/Hip Exercises: Machines for Strengthening   Cybex Leg Press 30# 2x12, LLE 2x5 20#  PT Short Term Goals - 03/17/21 0936       PT SHORT TERM GOAL #1   Title Pt will be independent with initial HEP.    Status Achieved               PT Long Term Goals - 05/19/21 1345       PT LONG TERM GOAL #1   Title Pt will be independent with advanced HEP.    Status On-going      PT LONG TERM GOAL #2   Title Patient will increase L hip and knee strength to at least 4+/5 to allow him to ambulate without device.    Status On-going      PT LONG TERM GOAL #3   Title Patient will decrease TUG to less than 14 seconds without device to place him at a low risk of falling.    Status On-going      PT LONG TERM GOAL #4   Title Pt will return to his previous ambulation routine without assistive device and no increase in pain.    Status On-going                   Plan - 05/19/21 1230     Clinical Impression Statement Mr Pietrangelo is making great progress with skilled PT.  He has progressed to initiate gait training without SPC with gait belt and CGA.  Pt is progressing with increased strength, but continues to have decreased weight bearing through LLE with ambulation, especially without SPC.  He  reports that he is increasing his ambulation at home with use of SPC, but is still unable to go to as many family functions as he did previously.  Pt would benefit from additional 2x/wk for 4 weeks to further progress towards independence without SPC and improved stability on R hip.    Personal Factors and Comorbidities Age    Examination-Activity Limitations Locomotion Level;Stairs    Examination-Participation Restrictions Community Activity;Yard Work    Stability/Clinical Decision Making Stable/Uncomplicated    Clinical Decision Making Low    Rehab Potential Good    PT Frequency 2x / week    PT Duration 4 weeks    PT Treatment/Interventions ADLs/Self Care Home Management;Cryotherapy;Electrical Stimulation;Iontophoresis '4mg'$ /ml Dexamethasone;Moist Heat;Ultrasound;Gait training;Stair training;Therapeutic activities;Functional mobility training;Therapeutic exercise;Balance training;Neuromuscular re-education;Patient/family education;Manual techniques;Scar mobilization;Passive range of motion;Dry needling;Energy conservation    PT Next Visit Plan Progress strengthening, ambulation, balance    Consulted and Agree with Plan of Care Patient             Patient will benefit from skilled therapeutic intervention in order to improve the following deficits and impairments:  Decreased balance, Difficulty walking, Decreased activity tolerance, Decreased strength, Pain  Visit Diagnosis: Pain in left hip - Plan: PT plan of care cert/re-cert  Muscle weakness (generalized) - Plan: PT plan of care cert/re-cert  Difficulty in walking, not elsewhere classified - Plan: PT plan of care cert/re-cert     Problem List Patient Active Problem List   Diagnosis Date Noted   Tobacco use 12/10/2020   Mixed hyperlipidemia 12/10/2020   History of throat cancer 12/10/2020   S/p left hip fracture 12/10/2020   Hyponatremia 12/04/2020   Essential hypertension 12/04/2020   Hip fracture ([place]) 12/04/2020   Closed  left hip fracture ([place]) 12/03/2020    Juel Burrow, PT, DPT 05/19/2021, 1:48 PM  [place]. [place], [place], [address] Phone: [phone]   Fax:  [phone]  Name: OBA TREJOS MRN: [id number] Date of Birth: 11/11/1940

## 2021-05-24 ENCOUNTER — Ambulatory Visit: Payer: Medicare Other | Admitting: Rehabilitative and Restorative Service Providers"

## 2021-05-26 ENCOUNTER — Ambulatory Visit: Payer: Medicare Other | Admitting: Rehabilitative and Restorative Service Providers"

## 2021-05-31 ENCOUNTER — Ambulatory Visit: Payer: Medicare Other | Admitting: Rehabilitative and Restorative Service Providers"

## 2021-05-31 ENCOUNTER — Encounter: Payer: Self-pay | Admitting: Rehabilitative and Restorative Service Providers"

## 2021-05-31 ENCOUNTER — Other Ambulatory Visit: Payer: Self-pay

## 2021-05-31 DIAGNOSIS — M25552 Pain in left hip: Secondary | ICD-10-CM | POA: Diagnosis not present

## 2021-05-31 DIAGNOSIS — R262 Difficulty in walking, not elsewhere classified: Secondary | ICD-10-CM

## 2021-05-31 DIAGNOSIS — M6281 Muscle weakness (generalized): Secondary | ICD-10-CM

## 2021-05-31 NOTE — Therapy (Signed)
[place]. [place], [place], [address] Phone: [phone]   Fax:  [phone]  Physical Therapy Treatment  Patient Details  Name: Sergio Chavez MRN: [id number] Date of Birth: 1941/01/03 Referring Provider (PT): Sergio Spinner, PA-C  Progress Note Reporting Period 03/03/2021 to 05/31/2021  See note below for Objective Data and Assessment of Progress/Goals.      Encounter Date: 05/31/2021   PT End of Session - 05/31/21 1152     Visit Number 20    Date for PT Re-Evaluation 06/17/21    PT Start Time 1141    PT Stop Time 1220    PT Time Calculation (min) 39 min    Equipment Utilized During Treatment Gait belt    Activity Tolerance Patient tolerated treatment well    Behavior During Therapy WFL for tasks assessed/performed             Past Medical History:  Diagnosis Date   Hypertension    Throat cancer [facility])     Past Surgical History:  Procedure Laterality Date   BACK SURGERY     HIP ARTHROPLASTY Left 12/04/2020   Procedure: HIP HEMIARTHROPLASTY;  Surgeon: Altamese [place], MD;  Location: [place];  Service: Orthopedics;  Laterality: Left;   PROSTATE SURGERY      There were no vitals filed for this visit.   Subjective Assessment - 05/31/21 1151     Subjective I was able to walk about 30 ft at home without my cane, it felt good.  I am still weak from being sick.    Patient Stated Goals To walk without a cane as he did before.    Currently in Pain? Yes    Pain Score 2     Pain Location Hip                [facility] PT Assessment - 05/31/21 0001       Assessment   Medical Diagnosis L femoral neck Fx s/p hemiarthoplasty    Referring Provider (PT) Sergio Spinner, PA-C    Onset Date/Surgical Date 12/03/20      Prior Function   Level of Independence Independent    Vocation Retired    Leisure yard work      Advice worker Comments L knee strenth of 4/5, L hip strength of 44mnus to 4/5  grossly throughout                           [facility] - [facility] 0001       Ambulation/Gait   Gait Comments 2 x 100 ft without AD with CGA and seated recovery period between.  x200 ft without AD with CGA with seated recovery period following.      Timed Up and Go Test   TUG Normal TUG    Normal TUG (seconds) 20.2   without assistive device     Knee/Hip Exercises: Aerobic   Nustep L5 LE only x6 min      Knee/Hip Exercises: Machines for Strengthening   Cybex Leg Press 30# 2x12, LLE 2x5 20#      Knee/Hip Exercises: Standing   Other Standing Knee Exercises ALT 2 in box taps 2x10   with unilateral HHA on L side                     PT Short Term Goals - 03/17/21 05449  PT SHORT TERM GOAL #1   Title Pt will be independent with initial HEP.    Status Achieved               PT Long Term Goals - 05/31/21 1235       PT LONG TERM GOAL #1   Title Pt will be independent with advanced HEP.    Status Partially Met      PT LONG TERM GOAL #2   Title Patient will increase L hip and knee strength to at least 4+/5 to allow him to ambulate without device.    Status On-going      PT LONG TERM GOAL #3   Title Patient will decrease TUG to less than 14 seconds without device to place him at a low risk of falling.    Status On-going      PT LONG TERM GOAL #4   Title Pt will return to his previous ambulation routine without assistive device and no increase in pain.    Status On-going                   Plan - 05/31/21 1231     Clinical Impression Statement Sergio Chavez is making great progress towards goal related activities.  His TUG has improved to 20.2 seconds without assistive device.  He was able to ambulate 200 ft without assistive device around PT gym and into parking lot without loss of balance over thresholds.  He is improving with his confidence and weight bearing through LLE during ambulation, but he does still  have decreased LLE weight bearing and decreased stance time on LLE during ambulation.  Pt continues to improve on LLE strength and continues to require skilled PT to progress towards his goal of being able to ambulate without assistive device again.    PT Treatment/Interventions ADLs/Self Care Home Management;Cryotherapy;Electrical Stimulation;Iontophoresis 69m/ml Dexamethasone;Moist Heat;Ultrasound;Gait training;Stair training;Therapeutic activities;Functional mobility training;Therapeutic exercise;Balance training;Neuromuscular re-education;Patient/family education;Manual techniques;Scar mobilization;Passive range of motion;Dry needling;Energy conservation    PT Next Visit Plan Progress strengthening, ambulation, balance    Consulted and Agree with Plan of Care Patient             Patient will benefit from skilled therapeutic intervention in order to improve the following deficits and impairments:  Decreased balance, Difficulty walking, Decreased activity tolerance, Decreased strength, Pain  Visit Diagnosis: Pain in left hip  Muscle weakness (generalized)  Difficulty in walking, not elsewhere classified     Problem List Patient Active Problem List   Diagnosis Date Noted   Tobacco use 12/10/2020   Mixed hyperlipidemia 12/10/2020   History of throat cancer 12/10/2020   S/p left hip fracture 12/10/2020   Hyponatremia 12/04/2020   Essential hypertension 12/04/2020   Hip fracture ([place] 12/04/2020   Closed left hip fracture ([place] 12/03/2020    SJuel Burrow PT, DPT 05/31/2021, 12:37 PM  [place] [address]Phone: [phone]  Fax:  [phone] Name: Sergio PAVICHMRN: [id number] of Birth: 2September 09, 1942

## 2021-06-02 ENCOUNTER — Other Ambulatory Visit: Payer: Self-pay

## 2021-06-02 ENCOUNTER — Encounter: Payer: Self-pay | Admitting: Rehabilitative and Restorative Service Providers"

## 2021-06-02 ENCOUNTER — Ambulatory Visit: Payer: Medicare Other | Admitting: Rehabilitative and Restorative Service Providers"

## 2021-06-02 DIAGNOSIS — M25552 Pain in left hip: Secondary | ICD-10-CM

## 2021-06-02 DIAGNOSIS — R262 Difficulty in walking, not elsewhere classified: Secondary | ICD-10-CM

## 2021-06-02 DIAGNOSIS — M6281 Muscle weakness (generalized): Secondary | ICD-10-CM

## 2021-06-02 NOTE — Therapy (Signed)
[address]. [name], [place], [address] Phone: [phone]   Fax:  [phone]  Physical Therapy Treatment  Patient Details  Name: Sergio Chavez MRN: [id number] Date of Birth: Jun 22, 1941 Referring Provider (PT): Sergio Spinner, PA-C   Encounter Date: 06/02/2021   PT End of Session - 06/02/21 1241     Visit Number 21    Date for PT Re-Evaluation 06/17/21    PT Start Time 1229    PT Stop Time 1310    PT Time Calculation (min) 41 min    Equipment Utilized During Treatment Gait belt    Activity Tolerance Patient tolerated treatment well    Behavior During Therapy [facility] for tasks assessed/performed             Past Medical History:  Diagnosis Date   Hypertension    Throat cancer Sergio Chavez)     Past Surgical History:  Procedure Laterality Date   BACK SURGERY     HIP ARTHROPLASTY Left 12/04/2020   Procedure: HIP HEMIARTHROPLASTY;  Surgeon: Sergio [place], MD;  Location: [place];  Service: Orthopedics;  Laterality: Left;   PROSTATE SURGERY      There were no vitals filed for this visit.   Subjective Assessment - 06/02/21 1239     Subjective I went to see Dr Sergio Chavez yesterday and he wants me go see another doctor, I told him that I didn't want to go to another doctor.    Patient Stated Goals To walk without a cane as he did before.    Currently in Pain? Yes    Pain Score 2     Pain Location Hip    Pain Orientation Left                               [facility] Adult PT Treatment/Exercise - 06/02/21 0001       Ambulation/Gait   Gait Comments 2 x 100 ft without AD with CGA and seated recovery period between.      Knee/Hip Exercises: Stretches   Active Hamstring Stretch Both;2 reps;20 seconds    Piriformis Stretch Both;2 reps;20 seconds      Knee/Hip Exercises: Standing   Functional Squat 5 reps    Functional Squat Limitations cuing for increased weight bearing through LLE      Knee/Hip  Exercises: Supine   Bridges Both;1 set;10 reps      Knee/Hip Exercises: Sidelying   Clams LLE x10 reps                    PT Education - 06/02/21 1258     Education Details Provided HEP    Person(s) Educated Patient    Methods Explanation;Demonstration;Handout    Comprehension Verbalized understanding;Returned demonstration              PT Short Term Goals - 03/17/21 0936       PT SHORT TERM GOAL #1   Title Pt will be independent with initial HEP.    Status Achieved               PT Long Term Goals - 05/31/21 1235       PT LONG TERM GOAL #1   Title Pt will be independent with advanced HEP.    Status Partially Met      PT LONG TERM GOAL #2   Title Patient will increase L hip and knee strength to  at least 4+/5 to allow him to ambulate without device.    Status On-going      PT LONG TERM GOAL #3   Title Patient will decrease TUG to less than 14 seconds without device to place him at a low risk of falling.    Status On-going      PT LONG TERM GOAL #4   Title Pt will return to his previous ambulation routine without assistive device and no increase in pain.    Status On-going                   Plan - 06/02/21 1322     Clinical Impression Statement Mr Sergio Chavez is making progress towards goal related activities.  He continues to be able to progress with ambulation without assistive device, with cuing for taking longer step lengths and increasing weight bearing through LLE.  Session focused on increasing use of LLE and cuing for increased weight bearing throughout session.  He continues to require skilled PT to progress towards goal related activities.    PT Treatment/Interventions ADLs/Self Care Home Management;Cryotherapy;Electrical Stimulation;Iontophoresis 77m/ml Dexamethasone;Moist Heat;Ultrasound;Gait training;Stair training;Therapeutic activities;Functional mobility training;Therapeutic exercise;Balance training;Neuromuscular  re-education;Patient/family education;Manual techniques;Scar mobilization;Passive range of motion;Dry needling;Energy conservation    PT Next Visit Plan Progress strengthening, ambulation, balance    PT Home Exercise Plan Access Code: [email or web address]   Consulted and Agree with Plan of Care Patient             Patient will benefit from skilled therapeutic intervention in order to improve the following deficits and impairments:  Decreased balance, Difficulty walking, Decreased activity tolerance, Decreased strength, Pain  Visit Diagnosis: Pain in left hip  Muscle weakness (generalized)  Difficulty in walking, not elsewhere classified     Problem List Patient Active Problem List   Diagnosis Date Noted   Tobacco use 12/10/2020   Mixed hyperlipidemia 12/10/2020   History of throat cancer 12/10/2020   S/p left hip fracture 12/10/2020   Hyponatremia 12/04/2020   Essential hypertension 12/04/2020   Hip fracture ([facility] 12/04/2020   Closed left hip fracture ([place] 12/03/2020    [name] PT, DPT 06/02/2021, 1:28 PM  [place] [address]Phone: [phone]  Fax:  [phone] Name: Sergio WOMBLESMRN: [id number] of Birth: 202-21-1942

## 2021-06-02 NOTE — Patient Instructions (Signed)
Access Code: [email or web address] URL: [email or web address] Date: 06/02/2021 Prepared by: Shelby Dubin Sherril Shipman  Exercises Supine Bridge - 1 x daily - 7 x weekly - 2 sets - 10 reps Supine Piriformis Stretch with Foot on Ground - 1 x daily - 7 x weekly - 1 sets - 3 reps - 20 sec hold Supine Hamstring Stretch - 1 x daily - 7 x weekly - 1 sets - 3 reps - 20 sec hold Clamshell - 1 x daily - 7 x weekly - 2 sets - 10 reps Standing Marching - 1 x daily - 7 x weekly - 2 sets - 10 reps Standing Hip Flexion with Resistance Loop - 1-2 x daily - 7 x weekly - 2 sets - 10 reps Hip Abduction with Resistance Loop - 1-2 x daily - 7 x weekly - 2 sets - 10 reps Hip Extension with Resistance Loop - 1-2 x daily - 7 x weekly - 2 sets - 10 reps Mini Squat with Counter Support - 1-2 x daily - 7 x weekly - 2 sets - 10 reps

## 2021-06-07 ENCOUNTER — Other Ambulatory Visit: Payer: Self-pay

## 2021-06-07 ENCOUNTER — Encounter: Payer: Self-pay | Admitting: Rehabilitative and Restorative Service Providers"

## 2021-06-07 ENCOUNTER — Ambulatory Visit: Payer: Medicare Other | Admitting: Rehabilitative and Restorative Service Providers"

## 2021-06-07 DIAGNOSIS — M25552 Pain in left hip: Secondary | ICD-10-CM | POA: Diagnosis not present

## 2021-06-07 DIAGNOSIS — R262 Difficulty in walking, not elsewhere classified: Secondary | ICD-10-CM

## 2021-06-07 DIAGNOSIS — M6281 Muscle weakness (generalized): Secondary | ICD-10-CM

## 2021-06-07 NOTE — Therapy (Signed)
[place]. [place], [place], [address] Phone: [phone]   Fax:  [phone]  Physical Therapy Treatment  Patient Details  Name: Sergio Chavez MRN: [id number] Date of Birth: [birth year] Referring Provider (PT): Ainsley Spinner, PA-C   Encounter Date: 06/07/2021   PT End of Session - 06/07/21 1142     Visit Number 22    Date for PT Re-Evaluation 06/17/21    PT Start Time 1140    PT Stop Time 1220    PT Time Calculation (min) 40 min    Equipment Utilized During Treatment Gait belt    Activity Tolerance Patient tolerated treatment well    Behavior During Therapy [facility] for tasks assessed/performed             Past Medical History:  Diagnosis Date   Hypertension    Throat cancer [facility])     Past Surgical History:  Procedure Laterality Date   BACK SURGERY     HIP ARTHROPLASTY Left 12/04/2020   Procedure: HIP HEMIARTHROPLASTY;  Surgeon: Altamese [place], MD;  Location: [place];  Service: Orthopedics;  Laterality: Left;   PROSTATE SURGERY      There were no vitals filed for this visit.   Subjective Assessment - 06/07/21 1141     Subjective I am doing okay, very little pain.    Patient Stated Goals To walk without a cane as he did before.    Currently in Pain? Yes    Pain Score 1     Pain Location Hip    Pain Orientation Left                               [facility] Adult PT Treatment/Exercise - 06/07/21 0001       Ambulation/Gait   Gait Comments 3 x 80 ft with SBA. 1x100 ft outside with SBA without AD on all ambulation trials.      Knee/Hip Exercises: Aerobic   Nustep L5 LE only x6 min      Knee/Hip Exercises: Standing   Other Standing Knee Exercises ALT 4 in box taps x10, x10 with 2" step   cuing to not utilize UE for support/pulling up.     Knee/Hip Exercises: Seated   Sit to Sand 2 sets;10 reps;with UE support   on AirEx                     PT Short Term Goals -  03/17/21 0936       PT SHORT TERM GOAL #1   Title Pt will be independent with initial HEP.    Status Achieved               PT Long Term Goals - 05/31/21 1235       PT LONG TERM GOAL #1   Title Pt will be independent with advanced HEP.    Status Partially Met      PT LONG TERM GOAL #2   Title Patient will increase L hip and knee strength to at least 4+/5 to allow him to ambulate without device.    Status On-going      PT LONG TERM GOAL #3   Title Patient will decrease TUG to less than 14 seconds without device to place him at a low risk of falling.    Status On-going      PT LONG TERM GOAL #4   Title Pt  will return to his previous ambulation routine without assistive device and no increase in pain.    Status On-going                   Plan - 06/07/21 1145     Clinical Impression Statement Mr Forst is progressing well and tolerating TE well.  He had increased ambulation without assistive device, and on last ambulation trials, was able to progress from CGA to SBA.  He continues to progress with his confidence with exercises.  He requires occasional cuing for increased weight bearing through LLE and improved step length during ambulation.    PT Treatment/Interventions ADLs/Self Care Home Management;Cryotherapy;Electrical Stimulation;Iontophoresis 1m/ml Dexamethasone;Moist Heat;Ultrasound;Gait training;Stair training;Therapeutic activities;Functional mobility training;Therapeutic exercise;Balance training;Neuromuscular re-education;Patient/family education;Manual techniques;Scar mobilization;Passive range of motion;Dry needling;Energy conservation    PT Next Visit Plan Progress strengthening, ambulation, balance    Consulted and Agree with Plan of Care Patient             Patient will benefit from skilled therapeutic intervention in order to improve the following deficits and impairments:  Decreased balance, Difficulty walking, Decreased activity tolerance,  Decreased strength, Pain  Visit Diagnosis: Muscle weakness (generalized)  Difficulty in walking, not elsewhere classified  Pain in left hip     Problem List Patient Active Problem List   Diagnosis Date Noted   Tobacco use 12/10/2020   Mixed hyperlipidemia 12/10/2020   History of throat cancer 12/10/2020   S/p left hip fracture 12/10/2020   Hyponatremia 12/04/2020   Essential hypertension 12/04/2020   [place] fracture ([place] 12/04/2020   Closed [place] fracture ([place] 12/03/2020    SJuel Burrow PT, DPT 06/07/2021, 12:20 PM  [place] [address]Phone: [phone]  Fax:  [phone] Name: Sergio DOUBLINMRN: [id number] of Birth: 2February 26, 1942

## 2021-06-09 ENCOUNTER — Other Ambulatory Visit: Payer: Self-pay

## 2021-06-09 ENCOUNTER — Encounter: Payer: Self-pay | Admitting: Rehabilitative and Restorative Service Providers"

## 2021-06-09 ENCOUNTER — Ambulatory Visit: Payer: Medicare Other | Attending: Orthopedic Surgery | Admitting: Rehabilitative and Restorative Service Providers"

## 2021-06-09 DIAGNOSIS — M6281 Muscle weakness (generalized): Secondary | ICD-10-CM | POA: Diagnosis not present

## 2021-06-09 DIAGNOSIS — M25551 Pain in right hip: Secondary | ICD-10-CM | POA: Insufficient documentation

## 2021-06-09 DIAGNOSIS — M25552 Pain in left hip: Secondary | ICD-10-CM | POA: Diagnosis present

## 2021-06-09 DIAGNOSIS — R600 Localized edema: Secondary | ICD-10-CM | POA: Insufficient documentation

## 2021-06-09 DIAGNOSIS — R262 Difficulty in walking, not elsewhere classified: Secondary | ICD-10-CM | POA: Diagnosis present

## 2021-06-09 NOTE — Therapy (Signed)
[place]. [place], [place], [address] Phone: [phone]   Fax:  [phone]  Physical Therapy Treatment  Patient Details  Name: Sergio Chavez MRN: [id number] Date of Birth: [birth year] Referring Provider (PT): Ainsley Spinner, PA-C   Encounter Date: 06/09/2021   PT End of Session - 06/09/21 1150     Visit Number 23    Date for PT Re-Evaluation 06/17/21    PT Start Time 3295    PT Stop Time 1225    PT Time Calculation (min) 40 min    Equipment Utilized During Treatment Gait belt    Activity Tolerance Patient tolerated treatment well    Behavior During Therapy [facility] for tasks assessed/performed             Past Medical History:  Diagnosis Date   Hypertension    Throat cancer [facility])     Past Surgical History:  Procedure Laterality Date   BACK SURGERY     HIP ARTHROPLASTY Left 12/04/2020   Procedure: HIP HEMIARTHROPLASTY;  Surgeon: Altamese [place], MD;  Location: [place];  Service: Orthopedics;  Laterality: Left;   PROSTATE SURGERY      There were no vitals filed for this visit.   Subjective Assessment - 06/09/21 1149     Subjective My leg is really bothering me today.    Patient Stated Goals To walk without a cane as he did before.    Currently in Pain? Yes    Pain Score 3     Pain Location Hip    Pain Orientation Left    Pain Descriptors / Indicators Nagging    Pain Type Surgical pain                               [facility] Adult PT Treatment/Exercise - 06/09/21 0001       Ambulation/Gait   Gait Comments Pt able to amb 150 ft without AD and SBA.      Timed Up and Go Test   TUG Normal TUG    Normal TUG (seconds) 18.6   with SPC.  19 sec without assist device     High Level Balance   High Level Balance Comments standing without UE support performing ball toss.      Knee/Hip Exercises: Aerobic   Nustep L6  x6 min      Knee/Hip Exercises: Machines for Strengthening   Cybex  Knee Extension 15# 2x10    Cybex Knee Flexion 25# 2x10    Cybex Leg Press 40# 2x10, LLE 20# x10      Knee/Hip Exercises: Standing   Other Standing Knee Exercises ALT 4 in box taps x10, x10 with 2" step      Knee/Hip Exercises: Seated   Sit to Sand 2 sets;10 reps;with UE support   on airex                     PT Short Term Goals - 03/17/21 0936       PT SHORT TERM GOAL #1   Title Pt will be independent with initial HEP.    Status Achieved               PT Long Term Goals - 06/09/21 1228       PT LONG TERM GOAL #1   Title Pt will be independent with advanced HEP.    Status Partially Met  PT LONG TERM GOAL #2   Title Patient will increase L hip and knee strength to at least 4+/5 to allow him to ambulate without device.    Status On-going      PT LONG TERM GOAL #3   Title Patient will decrease TUG to less than 14 seconds without device to place him at a low risk of falling.    Status Partially Met      PT LONG TERM GOAL #4   Title Pt will return to his previous ambulation routine without assistive device and no increase in pain.    Status On-going                   Plan - 06/09/21 1226     Clinical Impression Statement Mr Nehring is progressing well and progressing with increased ther ex well.  He was able to ambulate increased distance today without assistive device and is improving on his TUG score.  Pt requires some cuing with sit to stand to perform full upright stand each time.  He reported feeling much better after performing PT visit today.    PT Treatment/Interventions ADLs/Self Care Home Management;Cryotherapy;Electrical Stimulation;Iontophoresis 15m/ml Dexamethasone;Moist Heat;Ultrasound;Gait training;Stair training;Therapeutic activities;Functional mobility training;Therapeutic exercise;Balance training;Neuromuscular re-education;Patient/family education;Manual techniques;Scar mobilization;Passive range of motion;Dry needling;Energy  conservation    PT Next Visit Plan Progress strengthening, ambulation, balance    Consulted and Agree with Plan of Care Patient             Patient will benefit from skilled therapeutic intervention in order to improve the following deficits and impairments:  Decreased balance, Difficulty walking, Decreased activity tolerance, Decreased strength, Pain  Visit Diagnosis: Muscle weakness (generalized)  Difficulty in walking, not elsewhere classified  Pain in left hip     Problem List Patient Active Problem List   Diagnosis Date Noted   Tobacco use 12/10/2020   Mixed hyperlipidemia 12/10/2020   History of throat cancer 12/10/2020   S/p left hip fracture 12/10/2020   Hyponatremia 12/04/2020   Essential hypertension 12/04/2020   [place] ([place] 12/04/2020   Closed left hip fracture ([place] 12/03/2020    SJuel Burrow PT, DPT 06/09/2021, 12:29 PM  [place] [address]Phone: [phone]  Fax:  [phone] Name: Sergio COGDELLMRN: [id number] of Birth: 2August 07, 1942

## 2021-06-14 ENCOUNTER — Ambulatory Visit: Payer: Medicare Other | Admitting: Rehabilitative and Restorative Service Providers"

## 2021-06-14 ENCOUNTER — Other Ambulatory Visit: Payer: Self-pay

## 2021-06-14 ENCOUNTER — Encounter: Payer: Self-pay | Admitting: Rehabilitative and Restorative Service Providers"

## 2021-06-14 DIAGNOSIS — M6281 Muscle weakness (generalized): Secondary | ICD-10-CM | POA: Diagnosis not present

## 2021-06-14 DIAGNOSIS — M25552 Pain in left hip: Secondary | ICD-10-CM

## 2021-06-14 DIAGNOSIS — R262 Difficulty in walking, not elsewhere classified: Secondary | ICD-10-CM

## 2021-06-14 NOTE — Therapy (Signed)
[address] [address]. [place], [place], [address] Phone: [phone]   Fax:  [phone]  Physical Therapy Treatment  Patient Details  Name: Sergio Chavez MRN: [id number] Date of Birth: [birth year] Referring Provider (PT): Sergio Spinner, PA-C   Encounter Date: 06/14/2021   PT End of Session - 06/14/21 1150     Visit Number 24    Date for PT Re-Evaluation 06/17/21    PT Start Time 1829    PT Stop Time 1225    PT Time Calculation (min) 40 min    Activity Tolerance Patient tolerated treatment well    Behavior During Therapy [facility] for tasks assessed/performed             Past Medical History:  Diagnosis Date   Hypertension    Throat cancer [facility])     Past Surgical History:  Procedure Laterality Date   BACK SURGERY     HIP ARTHROPLASTY Left 12/04/2020   Procedure: HIP HEMIARTHROPLASTY;  Surgeon: Sergio Crystal, MD;  Location: [place];  Service: Orthopedics;  Laterality: Left;   PROSTATE SURGERY      There were no vitals filed for this visit.   Subjective Assessment - 06/14/21 1149     Subjective My pain is not as bad today    Patient Stated Goals To walk without a cane as he did before.    Currently in Pain? Yes    Pain Score 1     Pain Location Hip    Pain Orientation Left                               [facility] Adult PT Treatment/Exercise - 06/14/21 0001       Ambulation/Gait   Ambulation/Gait Yes    Ambulation/Gait Assistance 5: Supervision    Ambulation Distance (Feet) 200 Feet    Assistive device None    Gait Pattern Step-to pattern    Ambulation Surface Outdoor;Grass    Door Management 4: Min assist    Door Managment Details (indicate cue type and reason) cuing to step over threshold    Gait Comments Pt amb 240 ft without AD and SBA      High Level Balance   High Level Balance Comments Side stepping without UE support each way x4 laps.      Knee/Hip Exercises: Aerobic   Nustep L5  LE only x5 min      Knee/Hip Exercises: Machines for Strengthening   Cybex Knee Extension 15# 2x10, LLE only 5# x10    Cybex Knee Flexion 25# 2x10, LLE only 20# x10      Knee/Hip Exercises: Standing   Other Standing Knee Exercises ALT 4 in box taps 2x10 with cuing for decreased UE use      Knee/Hip Exercises: Seated   Sit to Sand 10 reps;with UE support   cuing to decrease use of UE pushing up from thighs                     PT Short Term Goals - 03/17/21 0936       PT SHORT TERM GOAL #1   Title Pt will be independent with initial HEP.    Status Achieved               PT Long Term Goals - 06/09/21 1228       PT LONG TERM GOAL #1   Title Pt  will be independent with advanced HEP.    Status Partially Met      PT LONG TERM GOAL #2   Title Patient will increase L hip and knee strength to at least 4+/5 to allow him to ambulate without device.    Status On-going      PT LONG TERM GOAL #3   Title Patient will decrease TUG to less than 14 seconds without device to place him at a low risk of falling.    Status Partially Met      PT LONG TERM GOAL #4   Title Pt will return to his previous ambulation routine without assistive device and no increase in pain.    Status On-going                   Plan - 06/14/21 1229     Clinical Impression Statement Sergio Chavez is progressing well with ther ex and with ambulation without AD.  He was able to progress with outside ambulation without device over unlevel grassy surfaces outside.  He does require cuing with change of surface, especially when a height difference is involved.  He continues to gain in confidence and was able to perform side stepping without UE assistance bilateral directions.    PT Treatment/Interventions ADLs/Self Care Home Management;Cryotherapy;Electrical Stimulation;Iontophoresis 56m/ml Dexamethasone;Moist Heat;Ultrasound;Gait training;Stair training;Therapeutic activities;Functional mobility  training;Therapeutic exercise;Balance training;Neuromuscular re-education;Patient/family education;Manual techniques;Scar mobilization;Passive range of motion;Dry needling;Energy conservation    PT Next Visit Plan Progress strengthening, ambulation, balance    Consulted and Agree with Plan of Care Patient             Patient will benefit from skilled therapeutic intervention in order to improve the following deficits and impairments:  Decreased balance, Difficulty walking, Decreased activity tolerance, Decreased strength, Pain  Visit Diagnosis: Muscle weakness (generalized)  Difficulty in walking, not elsewhere classified  Pain in left hip     Problem List Patient Active Problem List   Diagnosis Date Noted   Tobacco use 12/10/2020   Mixed hyperlipidemia 12/10/2020   History of throat cancer 12/10/2020   S/p left hip fracture 12/10/2020   Hyponatremia 12/04/2020   Essential hypertension 12/04/2020   [place] fracture ([place] 12/04/2020   Closed left hip fracture ([place] 12/03/2020    [name] Sergio Chavez 06/14/2021, 12:33 PM  [place] [id number]: [phone]  Fax:  [phone] Name: JWESLY WHISENANTMRN: [id number] of Birth: 2Mar 10, 1942

## 2021-06-16 ENCOUNTER — Ambulatory Visit: Payer: Medicare Other | Admitting: Rehabilitative and Restorative Service Providers"

## 2021-06-16 ENCOUNTER — Other Ambulatory Visit: Payer: Self-pay

## 2021-06-16 ENCOUNTER — Encounter: Payer: Self-pay | Admitting: Rehabilitative and Restorative Service Providers"

## 2021-06-16 DIAGNOSIS — M6281 Muscle weakness (generalized): Secondary | ICD-10-CM

## 2021-06-16 DIAGNOSIS — R262 Difficulty in walking, not elsewhere classified: Secondary | ICD-10-CM

## 2021-06-16 DIAGNOSIS — M25552 Pain in left hip: Secondary | ICD-10-CM

## 2021-06-16 NOTE — Therapy (Signed)
[place]. [place], [place], [address] Phone: [phone]   Fax:  [phone]  Physical Therapy Treatment  Patient Details  Name: Sergio Chavez MRN: [id number] Date of Birth: 1940-11-24 Referring Provider (PT): Ainsley Spinner, PA-C  Progress Note Reporting Period 03/03/2021 to 06/16/2021  See note below for Objective Data and Assessment of Progress/Goals.      Encounter Date: 06/16/2021   PT End of Session - 06/16/21 1147     Visit Number 25    Date for PT Re-Evaluation 07/15/21    PT Start Time 1145    PT Stop Time 1225    PT Time Calculation (min) 40 min    Activity Tolerance Patient tolerated treatment well    Behavior During Therapy [facility] for tasks assessed/performed             Past Medical History:  Diagnosis Date   Hypertension    Throat cancer [facility])     Past Surgical History:  Procedure Laterality Date   BACK SURGERY     HIP ARTHROPLASTY Left 12/04/2020   Procedure: HIP HEMIARTHROPLASTY;  Surgeon: Altamese [place], MD;  Location: [place];  Service: Orthopedics;  Laterality: Left;   PROSTATE SURGERY      There were no vitals filed for this visit.   Subjective Assessment - 06/16/21 1146     Subjective I am doing okay.    Patient Stated Goals To walk without a cane as he did before.    Currently in Pain? No/denies                [facility] PT Assessment - 06/16/21 0001       Observation/Other Assessments   Focus on Therapeutic Outcomes (FOTO)  59%      Strength   Overall Strength Comments L knee strength of 4+/5, L hip strength of grossly 4/5 throughout.                           [facility] Adult PT Treatment/Exercise - 06/16/21 0001       Ambulation/Gait   Gait Comments Pt amb 240 ft without AD and SBA      Knee/Hip Exercises: Aerobic   Nustep L5 LE only x6 min      Knee/Hip Exercises: Machines for Strengthening   Cybex Knee Extension 15# 2x10, LLE only 5# x10     Cybex Knee Flexion 35# 2x10, LLE only 20# x10    Cybex Leg Press 40# 2x10, LLE 20# x10      Knee/Hip Exercises: Standing   Other Standing Knee Exercises Marching 2x10 B   without UE support   Other Standing Knee Exercises Alt 6" box tap with UE 2x10      Knee/Hip Exercises: Seated   Sit to Sand 10 reps;with UE support                       PT Short Term Goals - 03/17/21 0936       PT SHORT TERM GOAL #1   Title Pt will be independent with initial HEP.    Status Achieved               PT Long Term Goals - 06/16/21 1237       PT LONG TERM GOAL #1   Title Pt will be independent with advanced HEP.    Status Partially Met      PT LONG  TERM GOAL #2   Title Patient will increase L hip and knee strength to at least 4+/5 to allow him to ambulate without device.    Status Partially Met      PT LONG TERM GOAL #3   Title Patient will decrease TUG to less than 14 seconds without device to place him at a low risk of falling.    Status Partially Met      PT LONG TERM GOAL #4   Title Pt will return to his previous ambulation routine without assistive device and no increase in pain.    Status On-going                   Plan - 06/16/21 1232     Clinical Impression Statement Mr Geathers continues to make progress towards goal related activities.  He has improved some with his FOTO assessment and is able to ambulate without assistive device throughout the entire PT session.  He is progressing with increased LLE strength and improved confidence with ambulation and stability.  He requires cuing with ambulation to take a longer stride length to allow for improved gait pattern.  He continues to require skilled PT to progress towards goal related activities.    PT Treatment/Interventions ADLs/Self Care Home Management;Cryotherapy;Electrical Stimulation;Iontophoresis 68m/ml Dexamethasone;Moist Heat;Ultrasound;Gait training;Stair training;Therapeutic activities;Functional  mobility training;Therapeutic exercise;Balance training;Neuromuscular re-education;Patient/family education;Manual techniques;Scar mobilization;Passive range of motion;Dry needling;Energy conservation    PT Next Visit Plan Progress strengthening, ambulation, balance    Consulted and Agree with Plan of Care Patient             Patient will benefit from skilled therapeutic intervention in order to improve the following deficits and impairments:  Decreased balance, Difficulty walking, Decreased activity tolerance, Decreased strength, Pain  Visit Diagnosis: Muscle weakness (generalized) - Plan: PT plan of care cert/re-cert  Difficulty in walking, not elsewhere classified - Plan: PT plan of care cert/re-cert  Pain in left hip - Plan: PT plan of care cert/re-cert     Problem List Patient Active Problem List   Diagnosis Date Noted   Tobacco use 12/10/2020   Mixed hyperlipidemia 12/10/2020   History of throat cancer 12/10/2020   S/p left hip fracture 12/10/2020   Hyponatremia 12/04/2020   Essential hypertension 12/04/2020   Hip fracture ([place] 12/04/2020   Closed left hip fracture ([place] 12/03/2020    SJuel Burrow PT, DPT 06/16/2021, 12:41 PM  [place] [address]Phone: [phone]  Fax:  [phone] Name: JNIKO PENSONMRN: [id number] of Birth: [birth year]

## 2021-06-21 ENCOUNTER — Other Ambulatory Visit: Payer: Self-pay

## 2021-06-21 ENCOUNTER — Encounter: Payer: Self-pay | Admitting: Rehabilitative and Restorative Service Providers"

## 2021-06-21 ENCOUNTER — Ambulatory Visit: Payer: Medicare Other | Admitting: Rehabilitative and Restorative Service Providers"

## 2021-06-21 DIAGNOSIS — M25552 Pain in left hip: Secondary | ICD-10-CM

## 2021-06-21 DIAGNOSIS — M6281 Muscle weakness (generalized): Secondary | ICD-10-CM | POA: Diagnosis not present

## 2021-06-21 DIAGNOSIS — R262 Difficulty in walking, not elsewhere classified: Secondary | ICD-10-CM

## 2021-06-21 NOTE — Therapy (Signed)
[address]. [place], [place], [address] Phone: [phone]   Fax:  [phone]  Physical Therapy Treatment  Patient Details  Name: Sergio Chavez MRN: [id number] Date of Birth: July 17, 1941 Referring Provider (PT): Ainsley Spinner, PA-C   Encounter Date: 06/21/2021   PT End of Session - 06/21/21 1241     Visit Number 26    Date for PT Re-Evaluation 07/15/21    PT Start Time 1145    PT Stop Time 1225    PT Time Calculation (min) 40 min    Activity Tolerance Patient tolerated treatment well    Behavior During Therapy [facility] for tasks assessed/performed             Past Medical History:  Diagnosis Date   Hypertension    Throat cancer [facility])     Past Surgical History:  Procedure Laterality Date   BACK SURGERY     HIP ARTHROPLASTY Left 12/04/2020   Procedure: HIP HEMIARTHROPLASTY;  Surgeon: Altamese Nemaha, MD;  Location: [place];  Service: Orthopedics;  Laterality: Left;   PROSTATE SURGERY      There were no vitals filed for this visit.   Subjective Assessment - 06/21/21 1240     Subjective I have been practicing at home.    Patient Stated Goals To walk without a cane as he did before.    Currently in Pain? No/denies                               [facility] Adult PT Treatment/Exercise - 06/21/21 0001       Ambulation/Gait   Gait Comments Pt amb greater than 200 ft without AD over various surfaces, including outside over pavement and grass to ambulate to his car.   Cuing for taking larger steps for an improved stride length.     Timed Up and Go Test   TUG Normal TUG    Normal TUG (seconds) 15.4   without AD     High Level Balance   High Level Balance Comments Side stepping without UE support each way x4 laps.      Knee/Hip Exercises: Aerobic   Nustep L5 LE only x6 min      Knee/Hip Exercises: Machines for Strengthening   Cybex Knee Extension 15# 2x10, LLE only 5# x10    Cybex Knee  Flexion 35# 2x10, LLE only 20# x10    Cybex Leg Press 40# 2x10, LLE 20# x10      Knee/Hip Exercises: Standing   Other Standing Knee Exercises Marching 2x10 B      Knee/Hip Exercises: Seated   Sit to Sand 10 reps;without UE support   slightly lifted black mat                      PT Short Term Goals - 03/17/21 0936       PT SHORT TERM GOAL #1   Title Pt will be independent with initial HEP.    Status Achieved               PT Long Term Goals - 06/21/21 1250       PT LONG TERM GOAL #1   Title Pt will be independent with advanced HEP.    Status Partially Met      PT LONG TERM GOAL #2   Title Patient will increase L hip and knee strength to at least  4+/5 to allow him to ambulate without device.    Status Partially Met      PT LONG TERM GOAL #3   Title Patient will decrease TUG to less than 14 seconds without device to place him at a low risk of falling.    Status Partially Met      PT LONG TERM GOAL #4   Title Pt will return to his previous ambulation routine without assistive device and no increase in pain.    Status On-going                   Plan - 06/21/21 1241     Clinical Impression Statement Mr Jablonsky continues to make progress towards improved confidence and ability to ambluate increased distances without assistive device.  He continues to require cuing to take longer steps to improve stride length.  He continues to tolerate strengthening well and without complaints of increased pain.  He was able to ambulate over unlevel grassy surfaces today without assistive device.    PT Treatment/Interventions ADLs/Self Care Home Management;Cryotherapy;Electrical Stimulation;Iontophoresis 7m/ml Dexamethasone;Moist Heat;Ultrasound;Gait training;Stair training;Therapeutic activities;Functional mobility training;Therapeutic exercise;Balance training;Neuromuscular re-education;Patient/family education;Manual techniques;Scar mobilization;Passive range of  motion;Dry needling;Energy conservation    PT Next Visit Plan Progress strengthening, ambulation, balance    Consulted and Agree with Plan of Care Patient             Patient will benefit from skilled therapeutic intervention in order to improve the following deficits and impairments:  Decreased balance, Difficulty walking, Decreased activity tolerance, Decreased strength, Pain  Visit Diagnosis: Muscle weakness (generalized)  Difficulty in walking, not elsewhere classified  Pain in left hip     Problem List Patient Active Problem List   Diagnosis Date Noted   Tobacco use 12/10/2020   Mixed hyperlipidemia 12/10/2020   History of throat cancer 12/10/2020   S/p left hip fracture 12/10/2020   Hyponatremia 12/04/2020   Essential hypertension 12/04/2020   Hip fracture ([place] 12/04/2020   Closed left hip fracture ([place] 12/03/2020    SJuel Burrow PT, DPT 06/21/2021, 12:52 PM  [place] [address]Phone: [phone]  Fax:  [phone] Name: Sergio KULIKOWSKIMRN: [id number] of Birth: [birth year]

## 2021-06-23 ENCOUNTER — Ambulatory Visit: Payer: Medicare Other | Admitting: Rehabilitative and Restorative Service Providers"

## 2021-06-23 ENCOUNTER — Other Ambulatory Visit: Payer: Self-pay

## 2021-06-23 ENCOUNTER — Encounter: Payer: Self-pay | Admitting: Rehabilitative and Restorative Service Providers"

## 2021-06-23 DIAGNOSIS — R262 Difficulty in walking, not elsewhere classified: Secondary | ICD-10-CM

## 2021-06-23 DIAGNOSIS — M6281 Muscle weakness (generalized): Secondary | ICD-10-CM

## 2021-06-23 DIAGNOSIS — M25552 Pain in left hip: Secondary | ICD-10-CM

## 2021-06-23 NOTE — Therapy (Signed)
[address]. [place], [place], [address] Phone: [phone]   Fax:  [phone]  Physical Therapy Treatment  Patient Details  Name: Sergio Chavez MRN: [id number] Date of Birth: 1941-07-24 Referring Provider (PT): Sergio Spinner, PA-C   Encounter Date: 06/23/2021   PT End of Session - 06/23/21 1143     Visit Number 27    Date for PT Re-Evaluation 07/15/21    PT Start Time 1137    PT Stop Time 1220    PT Time Calculation (min) 43 min    Activity Tolerance Patient tolerated treatment well    Behavior During Therapy [facility] for tasks assessed/performed             Past Medical History:  Diagnosis Date   Hypertension    Throat cancer [facility] [place] [facility])     Past Surgical History:  Procedure Laterality Date   BACK SURGERY     HIP ARTHROPLASTY Left 12/04/2020   Procedure: HIP HEMIARTHROPLASTY;  Surgeon: Sergio Mosier, MD;  Location: [place];  Service: Orthopedics;  Laterality: Left;   PROSTATE SURGERY      There were no vitals filed for this visit.   Subjective Assessment - 06/23/21 1142     Subjective Pt reports feeling weak this morning.    Patient Stated Goals To walk without a cane as he did before.    Currently in Pain? Yes    Pain Score 1     Pain Location Hip    Pain Orientation Left    Pain Descriptors / Indicators Throbbing    Pain Type Surgical pain                               [facility] Adult PT Treatment/Exercise - 06/23/21 0001       Ambulation/Gait   Gait Comments Pt amb outside down back walkway, down curb, and back up walkway with CGA and cuing/encouragement for larger steps.      High Level Balance   High Level Balance Comments Steppng over WaTE bar on floor and back 2x10 B   required CGA for improved balance     Knee/Hip Exercises: Aerobic   Nustep L5 LE only x6 min      Knee/Hip Exercises: Machines for Strengthening   Cybex Knee Extension 15# 2x10, LLE only 5# x10    Cybex  Knee Flexion 35# 2x10, LLE only 20# x10    Cybex Leg Press 40# 2x10, LLE 20# x10      Knee/Hip Exercises: Seated   Sit to Sand 10 reps;without UE support   2x10                      PT Short Term Goals - 03/17/21 0936       PT SHORT TERM GOAL #1   Title Pt will be independent with initial HEP.    Status Achieved               PT Long Term Goals - 06/21/21 1250       PT LONG TERM GOAL #1   Title Pt will be independent with advanced HEP.    Status Partially Met      PT LONG TERM GOAL #2   Title Patient will increase L hip and knee strength to at least 4+/5 to allow him to ambulate without device.    Status Partially Met  PT LONG TERM GOAL #3   Title Patient will decrease TUG to less than 14 seconds without device to place him at a low risk of falling.    Status Partially Met      PT LONG TERM GOAL #4   Title Pt will return to his previous ambulation routine without assistive device and no increase in pain.    Status On-going                   Plan - 06/23/21 1143     Clinical Impression Statement Mr Sergio Chavez continues to make improvements in strength and ambulation.  Today, he was able to perform sit to stand from black mat without UE use and without elevating surface.  He continues to progress.  Pt continues to require cuing to increase weight bearing through LLE during ambulation and standing exercises.  With cuing, pt is able to take longer stride length during ambulation. Pt requires CGA with step over WaTE bar on floor and back.  Pt continues to require skilled PT to progress towards goal related activities.    PT Treatment/Interventions ADLs/Self Care Home Management;Cryotherapy;Electrical Stimulation;Iontophoresis 98m/ml Dexamethasone;Moist Heat;Ultrasound;Gait training;Stair training;Therapeutic activities;Functional mobility training;Therapeutic exercise;Balance training;Neuromuscular re-education;Patient/family education;Manual  techniques;Scar mobilization;Passive range of motion;Dry needling;Energy conservation    PT Next Visit Plan Progress strengthening, ambulation, balance    Consulted and Agree with Plan of Care Patient             Patient will benefit from skilled therapeutic intervention in order to improve the following deficits and impairments:  Decreased balance, Difficulty walking, Decreased activity tolerance, Decreased strength, Pain  Visit Diagnosis: Muscle weakness (generalized)  Difficulty in walking, not elsewhere classified  Pain in left hip     Problem List Patient Active Problem List   Diagnosis Date Noted   Tobacco use 12/10/2020   Mixed hyperlipidemia 12/10/2020   History of throat cancer 12/10/2020   S/p left hip fracture 12/10/2020   Hyponatremia 12/04/2020   Essential hypertension 12/04/2020   Hip fracture ([facility] 12/04/2020   [place] ([place] 12/03/2020    [name] PT, DPT 06/23/2021, 12:26 PM  [place] [address]Phone: [phone]  Fax:  [phone] Name: Sergio FACKLERMRN: [id number] of Birth: 216-Jun-1942

## 2021-06-28 ENCOUNTER — Ambulatory Visit: Payer: Medicare Other | Admitting: Physical Therapy

## 2021-06-30 ENCOUNTER — Ambulatory Visit: Payer: Medicare Other | Admitting: Rehabilitative and Restorative Service Providers"

## 2021-07-05 ENCOUNTER — Ambulatory Visit: Payer: Medicare Other | Admitting: Rehabilitative and Restorative Service Providers"

## 2021-07-05 ENCOUNTER — Encounter: Payer: Self-pay | Admitting: Rehabilitative and Restorative Service Providers"

## 2021-07-05 ENCOUNTER — Other Ambulatory Visit: Payer: Self-pay

## 2021-07-05 DIAGNOSIS — M6281 Muscle weakness (generalized): Secondary | ICD-10-CM | POA: Diagnosis not present

## 2021-07-05 DIAGNOSIS — M25552 Pain in left hip: Secondary | ICD-10-CM

## 2021-07-05 DIAGNOSIS — R262 Difficulty in walking, not elsewhere classified: Secondary | ICD-10-CM

## 2021-07-05 NOTE — Therapy (Signed)
[address]. [place], [place], [address] Phone: [phone]   Fax:  [phone]  Physical Therapy Treatment  Patient Details  Name: Sergio Chavez MRN: [id number] Date of Birth: Mar 14, 1941 Referring Provider (PT): Ainsley Spinner, PA-C   Encounter Date: 07/05/2021   PT End of Session - 07/05/21 1235     Visit Number 28    Date for PT Re-Evaluation 07/15/21    PT Start Time 1230    PT Stop Time 1310    PT Time Calculation (min) 40 min    Activity Tolerance Patient tolerated treatment well    Behavior During Therapy [facility] for tasks assessed/performed             Past Medical History:  Diagnosis Date   Hypertension    Throat cancer [facility])     Past Surgical History:  Procedure Laterality Date   BACK SURGERY     HIP ARTHROPLASTY Left 12/04/2020   Procedure: HIP HEMIARTHROPLASTY;  Surgeon: Altamese Vernon, MD;  Location: [place];  Service: Orthopedics;  Laterality: Left;   PROSTATE SURGERY      There were no vitals filed for this visit.   Subjective Assessment - 07/05/21 1233     Subjective Pt reports that last week he was not feeling well and had bad allergies.  Pt states that he does not go to doctors unless he has to and denies taking a covid test.  States "I couldn't rest at night."    Patient Stated Goals To walk without a cane as he did before.    Currently in Pain? Yes    Pain Score 3     Pain Location Hip    Pain Orientation Left    Pain Descriptors / Indicators Sharp;Aching    Pain Type Surgical pain                               [facility] Adult PT Treatment/Exercise - 07/05/21 0001       Ambulation/Gait   Gait Comments Pt amb outside down back walkway, down curb, and back up walkway with CGA and cuing/encouragement for larger steps.      Knee/Hip Exercises: Aerobic   Nustep L5 LE only x6 min      Knee/Hip Exercises: Machines for Strengthening   Cybex Knee Extension 15# 2x10,  LLE only 5# x10    Cybex Knee Flexion 35# 2x10, LLE only 20# x10    Cybex Leg Press 40# 3 way feet x10 each.  LLE 20# x10      Knee/Hip Exercises: Seated   Sit to Sand 10 reps;with UE support   2x10                      PT Short Term Goals - 03/17/21 0936       PT SHORT TERM GOAL #1   Title Pt will be independent with initial HEP.    Status Achieved               PT Long Term Goals - 06/21/21 1250       PT LONG TERM GOAL #1   Title Pt will be independent with advanced HEP.    Status Partially Met      PT LONG TERM GOAL #2   Title Patient will increase L hip and knee strength to at least 4+/5 to allow him to ambulate without device.  Status Partially Met      PT LONG TERM GOAL #3   Title Patient will decrease TUG to less than 14 seconds without device to place him at a low risk of falling.    Status Partially Met      PT LONG TERM GOAL #4   Title Pt will return to his previous ambulation routine without assistive device and no increase in pain.    Status On-going                   Plan - 07/05/21 1315     Clinical Impression Statement Mr Sumler states that he has not been feeling well and was limited some with visit today.  He was able to ambulate outside without assistive device with close SBA/CGA, but does require cuing over threshholds and curbs for safety.  He was able to negotiate curb on first attempt today.  Following leg press with feet planted in 3 different positions, he had increased fatigue and required assist with ambulation back to mat.  He will follow up with his surgeon tomorrow and find out next steps for care of his L hip.  Pt with increased pain this week following a week of no therapy last week.  Will follow up with pt next visit following his visit with the doctor.    PT Treatment/Interventions ADLs/Self Care Home Management;Cryotherapy;Electrical Stimulation;Iontophoresis 27m/ml Dexamethasone;Moist Heat;Ultrasound;Gait  training;Stair training;Therapeutic activities;Functional mobility training;Therapeutic exercise;Balance training;Neuromuscular re-education;Patient/family education;Manual techniques;Scar mobilization;Passive range of motion;Dry needling;Energy conservation    PT Next Visit Plan Ask about MD appointment. Progress strengthening, ambulation, balance    Consulted and Agree with Plan of Care Patient             Patient will benefit from skilled therapeutic intervention in order to improve the following deficits and impairments:  Decreased balance, Difficulty walking, Decreased activity tolerance, Decreased strength, Pain  Visit Diagnosis: Muscle weakness (generalized)  Difficulty in walking, not elsewhere classified  Pain in left hip     Problem List Patient Active Problem List   Diagnosis Date Noted   Tobacco use 12/10/2020   Mixed hyperlipidemia 12/10/2020   History of throat cancer 12/10/2020   S/p left hip fracture 12/10/2020   Hyponatremia 12/04/2020   Essential hypertension 12/04/2020   Hip fracture ([place] 12/04/2020   Closed left hip fracture ([facility] 12/03/2020    SJuel Burrow PT, DPT 07/05/2021, 1:19 PM  [place] [address]Phone: [phone]  Fax:  [phone] Name: Sergio GONCALVESMRN: [id number] of Birth: 2Sep 24, 1942

## 2021-07-06 ENCOUNTER — Other Ambulatory Visit: Payer: Self-pay | Admitting: Orthopedic Surgery

## 2021-07-06 DIAGNOSIS — M25552 Pain in left hip: Secondary | ICD-10-CM

## 2021-07-07 ENCOUNTER — Encounter: Payer: Self-pay | Admitting: Rehabilitative and Restorative Service Providers"

## 2021-07-07 ENCOUNTER — Ambulatory Visit: Payer: Medicare Other | Admitting: Rehabilitative and Restorative Service Providers"

## 2021-07-07 ENCOUNTER — Other Ambulatory Visit: Payer: Self-pay

## 2021-07-07 DIAGNOSIS — R262 Difficulty in walking, not elsewhere classified: Secondary | ICD-10-CM

## 2021-07-07 DIAGNOSIS — M25551 Pain in right hip: Secondary | ICD-10-CM

## 2021-07-07 DIAGNOSIS — R600 Localized edema: Secondary | ICD-10-CM

## 2021-07-07 DIAGNOSIS — M6281 Muscle weakness (generalized): Secondary | ICD-10-CM

## 2021-07-07 DIAGNOSIS — M25552 Pain in left hip: Secondary | ICD-10-CM

## 2021-07-07 NOTE — Therapy (Signed)
[address]. [place], [place], [address] Phone: [phone]   Fax:  [phone]  Physical Therapy Treatment  Patient Details  Name: Sergio Chavez MRN: [id number] Date of Birth: 1941/03/10 Referring Provider (PT): Ainsley Spinner, PA-C   Encounter Date: 07/07/2021   PT End of Session - 07/07/21 0938     Visit Number 29    Date for PT Re-Evaluation 08/12/21    PT Start Time 0930    PT Stop Time 1010    PT Time Calculation (min) 40 min    Activity Tolerance Patient tolerated treatment well    Behavior During Therapy [facility] for tasks assessed/performed             Past Medical History:  Diagnosis Date   Hypertension    Throat cancer [facility])     Past Surgical History:  Procedure Laterality Date   BACK SURGERY     HIP ARTHROPLASTY Left 12/04/2020   Procedure: HIP HEMIARTHROPLASTY;  Surgeon: Altamese [place], MD;  Location: [place];  Service: Orthopedics;  Laterality: Left;   PROSTATE SURGERY      There were no vitals filed for this visit.   Subjective Assessment - 07/07/21 0934     Subjective Pt states that he went to see Dr Marcelino Scot and he has ordered diagnostic imaging with injected dye to assess what is going on with his hip as to why he keeps having pain and weakness.    Patient Stated Goals To walk without a cane as he did before.    Currently in Pain? Yes    Pain Score 3     Pain Location Hip    Pain Orientation Left                [facility] PT Assessment - 07/07/21 0001       Assessment   Medical Diagnosis L femoral neck Fx s/p hemiarthoplasty    Referring Provider (PT) Ainsley Spinner, PA-C    Onset Date/Surgical Date 12/03/20    Hand Dominance Right    Next MD Visit 07/14/21 for imaging.      Precautions   Precautions Fall      Balance Screen   Has the patient fallen in the past 6 months No      [place] residence    Living Arrangements Spouse/significant other     Home Access Stairs to enter    Entrance Stairs-Number of Steps 2    [place] One level    [place] - quad;Walker - 2 wheels;Bedside commode;Grab bars - tub/shower      Prior Function   Level of Independence Independent    Vocation Retired    Leisure yard work      Observation/Other Assessments   Focus on Therapeutic Outcomes (FOTO)  56%      Strength   Overall Strength Comments L knee strength of 4+/5, L hip strength of grossly 4/5 throughout.                           [address] Adult PT Treatment/Exercise - 07/07/21 0001       Ambulation/Gait   Gait Comments Amb inside PT clinic without AD with no loss of balance.      Timed Up and Go Test   TUG Normal TUG    Normal TUG (seconds) 12.6   12.8 sec  with SPC     High Level Balance   High Level Balance Comments Side step over      Knee/Hip Exercises: Aerobic   Nustep L5 LE only x6 min      Knee/Hip Exercises: Machines for Strengthening   Cybex Knee Extension 15# 2x10, LLE only 5# x10    Cybex Knee Flexion 35# 2x10, LLE only 20# x10    Cybex Leg Press 40# 3 way feet x10 each.  LLE 20# x10      Knee/Hip Exercises: Standing   Other Standing Knee Exercises Marching 2x10 B                       PT Short Term Goals - 03/17/21 0936       PT SHORT TERM GOAL #1   Title Pt will be independent with initial HEP.    Status Achieved               PT Long Term Goals - 07/07/21 1030       PT LONG TERM GOAL #1   Title Pt will be independent with advanced HEP.    Status Partially Met      PT LONG TERM GOAL #2   Title Patient will increase L hip and knee strength to at least 4+/5 to allow him to ambulate without device.    Status Partially Met      PT LONG TERM GOAL #3   Title Patient will decrease TUG to less than 14 seconds without device to place him at a low risk of falling.    Status Achieved      PT LONG TERM GOAL #4   Title Pt will  return to his previous ambulation routine without assistive device and no increase in pain.    Status On-going                   Plan - 07/07/21 1024     Clinical Impression Statement Mr Kluttz is scheduled for diagnostic imaging next week and he is hoping to find out why his L hip continues to hurt.  Pt has improved on his TUG time both with SPC and without AD.  He is increasing in confidence without AD, but does require encouragement.  With side stepping over a weight on the floor, pt requires UE support for increased balance and cuing to step over weight on floor instead of around it.  With marching in place, pt is able to lift his LE higher and does not require UE support.  He continues to require skilled PT to progress towards goal related activities of 2x/week for another 4 weeks.    Personal Factors and Comorbidities Age    Examination-Activity Limitations Locomotion Level;Stairs    Examination-Participation Restrictions Community Activity;Yard Work    Stability/Clinical Decision Making Stable/Uncomplicated    Clinical Decision Making Low    Rehab Potential Good    PT Frequency 2x / week    PT Duration 4 weeks    PT Treatment/Interventions ADLs/Self Care Home Management;Cryotherapy;Electrical Stimulation;Iontophoresis 4mg /ml Dexamethasone;Moist Heat;Ultrasound;Gait training;Stair training;Therapeutic activities;Functional mobility training;Therapeutic exercise;Balance training;Neuromuscular re-education;Patient/family education;Manual techniques;Scar mobilization;Passive range of motion;Dry needling;Energy conservation    PT Next Visit Plan Ask about diagnostic imaging. Progress strengthening, ambulation, balance    Consulted and Agree with Plan of Care Patient             Patient will benefit from skilled therapeutic intervention in order to improve the following deficits and impairments:  Decreased balance, Difficulty walking, Decreased activity tolerance, Decreased  strength, Pain, Decreased endurance  Visit Diagnosis: Muscle weakness (generalized) - Plan: PT plan of care cert/re-cert  Difficulty in walking, not elsewhere classified - Plan: PT plan of care cert/re-cert  Pain in left hip - Plan: PT plan of care cert/re-cert  Localized edema - Plan: PT plan of care cert/re-cert  Pain in right hip - Plan: PT plan of care cert/re-cert     Problem List Patient Active Problem List   Diagnosis Date Noted   Tobacco use 12/10/2020   Mixed hyperlipidemia 12/10/2020   History of throat cancer 12/10/2020   S/p left hip fracture 12/10/2020   Hyponatremia 12/04/2020   Essential hypertension 12/04/2020   Hip fracture ([place]) 12/04/2020   Closed left hip fracture ([place]) 12/03/2020    Juel Burrow, PT, DPT 07/07/2021, 10:38 AM  [place]. [place], [place], [address] Phone: [phone]   Fax:  [phone]  Name: Sergio Chavez MRN: [id number] Date of Birth: 1941/04/03

## 2021-07-14 ENCOUNTER — Other Ambulatory Visit: Payer: Self-pay

## 2021-07-14 ENCOUNTER — Ambulatory Visit
Admission: RE | Admit: 2021-07-14 | Discharge: 2021-07-14 | Disposition: A | Discharge status: Home / Self Care | Payer: Medicare Other | Source: Ambulatory Visit | Attending: Orthopedic Surgery | Admitting: Orthopedic Surgery

## 2021-07-14 DIAGNOSIS — M25552 Pain in left hip: Secondary | ICD-10-CM

## 2021-07-14 MED ORDER — METHYLPREDNISOLONE ACETATE 40 MG/ML INJ SUSP (RADIOLOG
80.0000 mg | Freq: Once | INTRAMUSCULAR | Status: AC
  Administered 2021-07-14: 80 mg via INTRA_ARTICULAR

## 2021-07-14 MED ORDER — IOPAMIDOL (ISOVUE-M 200) INJECTION 41%
1.0000 mL | Freq: Once | INTRAMUSCULAR | Status: AC
  Administered 2021-07-14: 1 mL via INTRA_ARTICULAR

## 2021-07-19 ENCOUNTER — Ambulatory Visit: Payer: Medicare Other | Admitting: Physical Therapy

## 2021-07-21 ENCOUNTER — Ambulatory Visit: Payer: Medicare Other | Admitting: Physical Therapy

## 2021-07-26 ENCOUNTER — Ambulatory Visit: Payer: Medicare Other | Admitting: Physical Therapy

## 2021-07-28 ENCOUNTER — Other Ambulatory Visit: Payer: Self-pay

## 2021-07-28 ENCOUNTER — Encounter: Payer: Self-pay | Admitting: Physical Therapy

## 2021-07-28 ENCOUNTER — Ambulatory Visit: Payer: Medicare Other | Attending: Orthopedic Surgery | Admitting: Physical Therapy

## 2021-07-28 DIAGNOSIS — M6281 Muscle weakness (generalized): Secondary | ICD-10-CM | POA: Diagnosis not present

## 2021-07-28 DIAGNOSIS — R262 Difficulty in walking, not elsewhere classified: Secondary | ICD-10-CM | POA: Diagnosis present

## 2021-07-28 DIAGNOSIS — M25552 Pain in left hip: Secondary | ICD-10-CM | POA: Insufficient documentation

## 2021-07-28 DIAGNOSIS — M25551 Pain in right hip: Secondary | ICD-10-CM | POA: Insufficient documentation

## 2021-07-28 DIAGNOSIS — R600 Localized edema: Secondary | ICD-10-CM | POA: Diagnosis present

## 2021-07-28 NOTE — Therapy (Signed)
[place]. [place], [place], [address] Phone: [phone]   Fax:  [phone]  Physical Therapy Treatment  Patient Details  Name: Sergio Chavez MRN: [id number] Date of Birth: February 05, 1941 Referring Provider (PT): Ainsley Spinner, PA-C   Encounter Date: 07/28/2021   PT End of Session - 07/28/21 1243     Visit Number 30    Date for PT Re-Evaluation 08/12/21    PT Start Time 1148    PT Stop Time 1236    PT Time Calculation (min) 48 min    Equipment Utilized During Treatment Gait belt    Activity Tolerance Patient tolerated treatment well    Behavior During Therapy [facility] for tasks assessed/performed             Past Medical History:  Diagnosis Date   Hypertension    Throat cancer [facility])     Past Surgical History:  Procedure Laterality Date   BACK SURGERY     HIP ARTHROPLASTY Left 12/04/2020   Procedure: HIP HEMIARTHROPLASTY;  Surgeon: Altamese [place]ing[place], MD;  Location: [facility];  Service: Orthopedics;  Laterality: Left;   PROSTATE SURGERY      There were no vitals filed for this visit.   Subjective Assessment - 07/28/21 1154     Subjective Patient had injection into his L hip for inflamation on 07/14/21. He reports minimal relief from the injection.    Pertinent History Hx of back surgery    Limitations Walking    How long can you sit comfortably? Fluctuates, at times does not bother him, at other times, it hurts immediately    How long can you stand comfortably? Fluctuates, at times does not bother him, at other times, it hurts immediately    How long can you walk comfortably? Fluctuates, at times does not bother him, at other times, it hurts immediately    Patient Stated Goals To walk without a cane as he did before.    Currently in Pain? No/denies                               [facility] Adult PT Treatment/Exercise - 07/28/21 0001       Ambulation/Gait   Ambulation/Gait Yes     Ambulation/Gait Assistance 6: Modified independent (Device/Increase time);7: Independent    Ambulation Distance (Feet) 100 Feet    Assistive device None    Gait Pattern Step-through pattern;Decreased step length - right;Decreased step length - left;Decreased stance time - right;Decreased stance time - left;Decreased stride length;Decreased hip/knee flexion - right;Decreased hip/knee flexion - left;Decreased weight shift to right;Decreased weight shift to left;Wide base of support    Ambulation Surface Level    Gait Comments Patien twalks iwth B hip ER, excessive hip extension with posterior lean of upper trunk.      Knee/Hip Exercises: Stretches   Passive Hamstring Stretch Both;1 rep;60 seconds    Piriformis Stretch Left;1 rep;20 seconds      Knee/Hip Exercises: Aerobic   Nustep L5 LE only x6 min      Knee/Hip Exercises: Standing   Hip Abduction Stengthening;Both;1 set;5 reps    Abduction Limitations Patient had difficulty maintaining balance while abducting against red T band.    Other Standing Knee Exercises single limb stance, alternately tapping each foot on 6" step, with straight cane and up to min A for balance, relied on cane,unable to perform without it.      Knee/Hip  Exercises: Supine   Other Supine Knee/Hip Exercises Bridge over [place]. Bridge and roll ball side side -3 x 5 reps. Bridge and roll ball up with hip and knee flex x 10 reps. Good control throughout.      Knee/Hip Exercises: Sidelying   Hip ABduction Strengthening;Left;1 set;5 reps                     PT Education - 07/28/21 1242     Education Details Added SLS at counter to HEP.    Person(s) Educated Patient    Methods Explanation;Demonstration    Comprehension Verbalized understanding              PT Short Term Goals - 07/28/21 1251       PT SHORT TERM GOAL #2   Title I with updated HEP    Baseline in progress    Time 2    Period Weeks    Status New    Target Date 08/11/21                PT Long Term Goals - 07/07/21 1030       PT LONG TERM GOAL #1   Title Pt will be independent with advanced HEP.    Status Partially Met      PT LONG TERM GOAL #2   Title Patient will increase L hip and knee strength to at least 4+/5 to allow him to ambulate without device.    Status Partially Met      PT LONG TERM GOAL #3   Title Patient will decrease TUG to less than 14 seconds without device to place him at a low risk of falling.    Status Achieved      PT LONG TERM GOAL #4   Title Pt will return to his previous ambulation routine without assistive device and no increase in pain.    Status On-going                   Plan - 07/28/21 1244     Clinical Impression Statement Patient's curren tstatus re-assessed after extended break between therapy sessions and hip injection. He demosntratesGood strength in B LE, minimally weaker on L, but stable in MMT. His ROM appears WFL in BLE, L SLR approximately 75 degrees, R slightly less. reported tightness in piriformis during stretch. No ITB tightness palpated, no spasm noted in either hip. Although his MMT was good, he demosntrated unsteadiness in standing, especially on LLE, but B. He relies on cane for balance and stability and was unable to perform standing hip abd against resistance without UE support.    Personal Factors and Comorbidities Age    Examination-Activity Limitations Locomotion Level;Stairs    Examination-Participation Restrictions Community Activity;Yard Work    Stability/Clinical Decision Making Stable/Uncomplicated    Clinical Decision Making Low    Rehab Potential Good    PT Frequency 2x / week    PT Duration 2 weeks    PT Treatment/Interventions ADLs/Self Care Home Management;Cryotherapy;Electrical Stimulation;Iontophoresis 81m/ml Dexamethasone;Moist Heat;Ultrasound;Gait training;Stair training;Therapeutic activities;Functional mobility training;Therapeutic exercise;Balance training;Neuromuscular  re-education;Patient/family education;Manual techniques;Scar mobilization;Passive range of motion;Dry needling;Energy conservation    PT Next Visit Plan UPdate HEP to include more dynamic standing activities, balance, and coordinated contraction of LE musculature.    PT Home Exercise Plan Access Code: [email or web address]    Consulted and Agree with Plan of Care Patient             Patient will benefit from skilled  therapeutic intervention in order to improve the following deficits and impairments:  Decreased balance, Difficulty walking, Decreased activity tolerance, Decreased strength, Pain, Decreased endurance  Visit Diagnosis: Muscle weakness (generalized)  Difficulty in walking, not elsewhere classified  Pain in left hip  Localized edema  Pain in right hip     Problem List Patient Active Problem List   Diagnosis Date Noted   Tobacco use 12/10/2020   Mixed hyperlipidemia 12/10/2020   History of throat cancer 12/10/2020   S/p left hip fracture 12/10/2020   Hyponatremia 12/04/2020   Essential hypertension 12/04/2020   Hip fracture ([place]) 12/04/2020   Closed left hip fracture ([place]) 12/03/2020    Marcelina Morel, DPT 07/28/2021, 12:52 PM  [place]. [place], [place], [address] Phone: [phone]   Fax:  [phone]  Name: Sergio Chavez MRN: [id number] Date of Birth: Aug 28, 1941

## 2021-08-02 ENCOUNTER — Ambulatory Visit: Payer: Medicare Other | Admitting: Physical Therapy

## 2021-08-02 ENCOUNTER — Other Ambulatory Visit: Payer: Self-pay

## 2021-08-02 ENCOUNTER — Encounter: Payer: Self-pay | Admitting: Physical Therapy

## 2021-08-02 DIAGNOSIS — M6281 Muscle weakness (generalized): Secondary | ICD-10-CM

## 2021-08-02 DIAGNOSIS — R262 Difficulty in walking, not elsewhere classified: Secondary | ICD-10-CM

## 2021-08-02 DIAGNOSIS — R600 Localized edema: Secondary | ICD-10-CM

## 2021-08-02 DIAGNOSIS — M25552 Pain in left hip: Secondary | ICD-10-CM

## 2021-08-02 DIAGNOSIS — M25551 Pain in right hip: Secondary | ICD-10-CM

## 2021-08-02 NOTE — Therapy (Signed)
[place]. [place], [place], [address] Phone: [phone]   Fax:  [phone]  Physical Therapy Treatment  Patient Details  Name: Sergio Chavez MRN: [id number] Date of Birth: [birth year] Referring Provider (PT): Ainsley Spinner, PA-C   Encounter Date: 08/02/2021   PT End of Session - 08/02/21 1225     Visit Number 31    Date for PT Re-Evaluation 08/12/21    PT Start Time 1104    PT Stop Time 1148    PT Time Calculation (min) 44 min    Activity Tolerance Patient tolerated treatment well    Behavior During Therapy [facility] for tasks assessed/performed             Past Medical History:  Diagnosis Date   Hypertension    Throat cancer [facility])     Past Surgical History:  Procedure Laterality Date   BACK SURGERY     HIP ARTHROPLASTY Left 12/04/2020   Procedure: HIP HEMIARTHROPLASTY;  Surgeon: Altamese Clarington, MD;  Location: [facility];  Service: Orthopedics;  Laterality: Left;   PROSTATE SURGERY      There were no vitals filed for this visit.   Subjective Assessment - 08/02/21 1103     Subjective Patient is slightly better.    Limitations Walking    Patient Stated Goals To walk without a cane as he did before.    Currently in Pain? Yes    Pain Score 2     Pain Location Hip    Pain Orientation Left    Pain Descriptors / Indicators Aching    Pain Type Surgical pain    Pain Onset More than a month ago    Pain Frequency Intermittent    Aggravating Factors  walking                               [facility] Adult PT Treatment/Exercise - 08/02/21 0001       Exercises   Exercises Shoulder      Knee/Hip Exercises: Stretches   Hip Flexor Stretch Left;2 reps;30 seconds    Hip Flexor Stretch Limitations Modified Thomas in supine      Knee/Hip Exercises: Supine   Bridges with Clamshell Strengthening;Both;1 set;10 reps;PROM    Bridges with Clamshell --   march each foot while holding the bridge.      Shoulder Exercises: Standing   Extension Strengthening;Both;15 reps;Theraband    Theraband Level (Shoulder Extension) Level 2 (Red)    Row Strengthening;Both;15 reps    Retraction Both;AAROM;10 reps;Theraband    Other Standing Exercises assessed ability to stand iwth back against wall and place B elbows and hands against the wall. Unable to position hands against the wall.      Shoulder Exercises: ROM/Strengthening   Proximal Shoulder Strengthening, Seated seated with B hands on mat, in ER. Push through hands and lift chest to elicit ant stretch. Able to achieve and maintain upright posture breifly, with forward head still present.                     PT Education - 08/02/21 1224     Education Details Updated HEP.    Person(s) Educated Patient    Methods Explanation;Demonstration;Handout    Comprehension Verbalized understanding;Returned demonstration              PT Short Term Goals - 08/02/21 1229       PT SHORT  TERM GOAL #2   Title I with updated HEP    Baseline in progress    Time 1    Period Weeks    Status On-going    Target Date 08/11/21               PT Long Term Goals - 07/07/21 1030       PT LONG TERM GOAL #1   Title Pt will be independent with advanced HEP.    Status Partially Met      PT LONG TERM GOAL #2   Title Patient will increase L hip and knee strength to at least 4+/5 to allow him to ambulate without device.    Status Partially Met      PT LONG TERM GOAL #3   Title Patient will decrease TUG to less than 14 seconds without device to place him at a low risk of falling.    Status Achieved      PT LONG TERM GOAL #4   Title Pt will return to his previous ambulation routine without assistive device and no increase in pain.    Status On-going                   Plan - 08/02/21 1225     Clinical Impression Statement Patient continues to demosntrate flexed posture, with shuffling gait and unsteadiness, requires straight  cane for balance. He had difficulty following instructions for exercises, even iwth VC, TC, demonstration, and repetition, limiting the activities performed, but puts forth good effort and participation throughout.    Stability/Clinical Decision Making Stable/Uncomplicated    Clinical Decision Making Low    Rehab Potential Good    PT Frequency 2x / week    PT Duration --   1w   PT Treatment/Interventions ADLs/Self Care Home Management;Cryotherapy;Electrical Stimulation;Iontophoresis 31m/ml Dexamethasone;Moist Heat;Ultrasound;Gait training;Stair training;Therapeutic activities;Functional mobility training;Therapeutic exercise;Balance training;Neuromuscular re-education;Patient/family education;Manual techniques;Scar mobilization;Passive range of motion;Dry needling;Energy conservation    PT Next Visit Plan Assess [place] to new HEP and update as appropriate.    PT Home Exercise Plan [email or web address]   Consulted and Agree with Plan of Care Patient             Patient will benefit from skilled therapeutic intervention in order to improve the following deficits and impairments:  Decreased balance, Difficulty walking, Decreased activity tolerance, Decreased strength, Pain, Decreased endurance  Visit Diagnosis: Muscle weakness (generalized)  Difficulty in walking, not elsewhere classified  Pain in left hip  Localized edema  Pain in right hip     Problem List Patient Active Problem List   Diagnosis Date Noted   Tobacco use 12/10/2020   Mixed hyperlipidemia 12/10/2020   History of throat cancer 12/10/2020   S/p left hip fracture 12/10/2020   Hyponatremia 12/04/2020   Essential hypertension 12/04/2020   [place] 12/04/2020   [place] 12/03[place]    SMarcelina Morel DPT 08/02/2021, 12:31 PM  [place] [address]Phone: [phone]  Fax:  [phone] Name: Sergio WOONMRN: [id number] of Birth: 212-04-1941

## 2021-08-02 NOTE — Patient Instructions (Signed)
Access Code: [email or web address] URL: [email or web address] Date: 08/02/2021 Prepared by: Ethel Rana  Exercises Marching Bridge - 1 x daily - 7 x weekly - 1 sets - 10 reps Clamshell with Resistance - 1 x daily - 7 x weekly - 1 sets - 10 reps Scapular Retraction with Resistance - 1 x daily - 7 x weekly - 2 sets - 10 reps Shoulder Extension with Resistance - Palms Forward - 1 x daily - 7 x weekly - 2 sets - 10 reps Scapular Wall Slides - 1 x daily - 7 x weekly - 1 sets - 10 reps

## 2021-08-04 ENCOUNTER — Ambulatory Visit: Payer: Medicare Other | Admitting: Physical Therapy

## 2021-08-31 ENCOUNTER — Emergency Department (HOSPITAL_BASED_OUTPATIENT_CLINIC_OR_DEPARTMENT_OTHER): Payer: Medicare Other | Admitting: Radiology

## 2021-08-31 ENCOUNTER — Emergency Department (HOSPITAL_BASED_OUTPATIENT_CLINIC_OR_DEPARTMENT_OTHER)
Admission: EM | Admit: 2021-08-31 | Discharge: 2021-08-31 | Disposition: A | Discharge status: Home / Self Care | Payer: Medicare Other | Attending: Emergency Medicine | Admitting: Emergency Medicine

## 2021-08-31 ENCOUNTER — Other Ambulatory Visit: Payer: Self-pay

## 2021-08-31 ENCOUNTER — Encounter (HOSPITAL_BASED_OUTPATIENT_CLINIC_OR_DEPARTMENT_OTHER): Payer: Self-pay | Admitting: Emergency Medicine

## 2021-08-31 DIAGNOSIS — I10 Essential (primary) hypertension: Secondary | ICD-10-CM | POA: Diagnosis not present

## 2021-08-31 DIAGNOSIS — S2231XA Fracture of one rib, right side, initial encounter for closed fracture: Secondary | ICD-10-CM | POA: Insufficient documentation

## 2021-08-31 DIAGNOSIS — Z8521 Personal history of malignant neoplasm of larynx: Secondary | ICD-10-CM | POA: Diagnosis not present

## 2021-08-31 DIAGNOSIS — Z96642 Presence of left artificial hip joint: Secondary | ICD-10-CM | POA: Insufficient documentation

## 2021-08-31 DIAGNOSIS — S299XXA Unspecified injury of thorax, initial encounter: Secondary | ICD-10-CM | POA: Diagnosis present

## 2021-08-31 DIAGNOSIS — F17210 Nicotine dependence, cigarettes, uncomplicated: Secondary | ICD-10-CM | POA: Diagnosis not present

## 2021-08-31 DIAGNOSIS — Z79899 Other long term (current) drug therapy: Secondary | ICD-10-CM | POA: Insufficient documentation

## 2021-08-31 DIAGNOSIS — Z7951 Long term (current) use of inhaled steroids: Secondary | ICD-10-CM | POA: Diagnosis not present

## 2021-08-31 DIAGNOSIS — I7121 Aneurysm of the ascending aorta, without rupture: Secondary | ICD-10-CM | POA: Insufficient documentation

## 2021-08-31 DIAGNOSIS — Y92410 Unspecified street and highway as the place of occurrence of the external cause: Secondary | ICD-10-CM | POA: Diagnosis not present

## 2021-08-31 MED ORDER — HYDROCODONE-ACETAMINOPHEN 5-325 MG PO TABS: 0.5000 | ORAL_TABLET | Freq: Four times a day (QID) | ORAL | 0 refills | Status: DC | PRN

## 2021-08-31 MED ORDER — HYDROCODONE-ACETAMINOPHEN 5-325 MG PO TABS
0.5000 | ORAL_TABLET | Freq: Four times a day (QID) | ORAL | 0 refills | Status: DC | PRN
Start: 2021-08-31 — End: 2021-11-23

## 2021-08-31 NOTE — ED Notes (Signed)
RN provided AVS using Teachback Method. Patient verbalizes understanding of Discharge Instructions. Opportunity for Questioning and Answers were provided by RN. Patient Discharged from ED in Wheelchair to Home with Family.  

## 2021-08-31 NOTE — ED Notes (Signed)
RT at Bedside for Sergio Chavez

## 2021-08-31 NOTE — ED Notes (Signed)
Patient returned from XRAY 
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
## 2021-08-31 NOTE — ED Notes (Signed)
RT note: Pt./family member educated on use and importance of Incentive Spirometer Q1 hr., pt. was able to teach back with moderate amount of instruction, stressed importance of use/frequency of every hour to avoid other complications to pt. along with family member.
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
## 2021-08-31 NOTE — ED Notes (Signed)
PA at Bedside.
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
## 2021-08-31 NOTE — ED Notes (Signed)
MD at Bedside.

## 2021-08-31 NOTE — ED Notes (Signed)
Patient transported to XRAY 

## 2021-09-30 ENCOUNTER — Encounter (HOSPITAL_COMMUNITY): Payer: Self-pay | Admitting: Radiology

## 2021-11-20 ENCOUNTER — Inpatient Hospital Stay (HOSPITAL_COMMUNITY): Payer: Medicare Other

## 2021-11-20 ENCOUNTER — Other Ambulatory Visit: Payer: Self-pay

## 2021-11-20 ENCOUNTER — Emergency Department (HOSPITAL_COMMUNITY): Payer: Medicare Other

## 2021-11-20 ENCOUNTER — Encounter (HOSPITAL_COMMUNITY): Payer: Self-pay | Admitting: Anesthesiology

## 2021-11-20 ENCOUNTER — Inpatient Hospital Stay (HOSPITAL_COMMUNITY)
Admission: EM | Admit: 2021-11-20 | Discharge: 2021-12-07 | DRG: 480 | Disposition: E | Payer: Medicare Other | Attending: Internal Medicine | Admitting: Internal Medicine

## 2021-11-20 ENCOUNTER — Encounter (HOSPITAL_COMMUNITY): Payer: Self-pay

## 2021-11-20 DIAGNOSIS — Z9114 Patient's other noncompliance with medication regimen: Secondary | ICD-10-CM

## 2021-11-20 DIAGNOSIS — C329 Malignant neoplasm of larynx, unspecified: Secondary | ICD-10-CM | POA: Diagnosis not present

## 2021-11-20 DIAGNOSIS — Z419 Encounter for procedure for purposes other than remedying health state, unspecified: Secondary | ICD-10-CM

## 2021-11-20 DIAGNOSIS — S72001A Fracture of unspecified part of neck of right femur, initial encounter for closed fracture: Secondary | ICD-10-CM | POA: Diagnosis not present

## 2021-11-20 DIAGNOSIS — D62 Acute posthemorrhagic anemia: Secondary | ICD-10-CM | POA: Diagnosis not present

## 2021-11-20 DIAGNOSIS — S22058A Other fracture of T5-T6 vertebra, initial encounter for closed fracture: Secondary | ICD-10-CM | POA: Diagnosis not present

## 2021-11-20 DIAGNOSIS — E871 Hypo-osmolality and hyponatremia: Secondary | ICD-10-CM | POA: Diagnosis not present

## 2021-11-20 DIAGNOSIS — Z85819 Personal history of malignant neoplasm of unspecified site of lip, oral cavity, and pharynx: Secondary | ICD-10-CM

## 2021-11-20 DIAGNOSIS — F05 Delirium due to known physiological condition: Secondary | ICD-10-CM | POA: Diagnosis not present

## 2021-11-20 DIAGNOSIS — J439 Emphysema, unspecified: Secondary | ICD-10-CM | POA: Diagnosis present

## 2021-11-20 DIAGNOSIS — Z66 Do not resuscitate: Secondary | ICD-10-CM | POA: Diagnosis present

## 2021-11-20 DIAGNOSIS — I739 Peripheral vascular disease, unspecified: Secondary | ICD-10-CM | POA: Diagnosis not present

## 2021-11-20 DIAGNOSIS — J9601 Acute respiratory failure with hypoxia: Secondary | ICD-10-CM | POA: Diagnosis not present

## 2021-11-20 DIAGNOSIS — I7143 Infrarenal abdominal aortic aneurysm, without rupture: Secondary | ICD-10-CM | POA: Diagnosis present

## 2021-11-20 DIAGNOSIS — W010XXA Fall on same level from slipping, tripping and stumbling without subsequent striking against object, initial encounter: Secondary | ICD-10-CM | POA: Diagnosis present

## 2021-11-20 DIAGNOSIS — E785 Hyperlipidemia, unspecified: Secondary | ICD-10-CM | POA: Diagnosis present

## 2021-11-20 DIAGNOSIS — S72009A Fracture of unspecified part of neck of unspecified femur, initial encounter for closed fracture: Secondary | ICD-10-CM | POA: Diagnosis present

## 2021-11-20 DIAGNOSIS — Z9221 Personal history of antineoplastic chemotherapy: Secondary | ICD-10-CM | POA: Diagnosis not present

## 2021-11-20 DIAGNOSIS — E43 Unspecified severe protein-calorie malnutrition: Secondary | ICD-10-CM | POA: Diagnosis present

## 2021-11-20 DIAGNOSIS — R410 Disorientation, unspecified: Secondary | ICD-10-CM | POA: Diagnosis not present

## 2021-11-20 DIAGNOSIS — F17210 Nicotine dependence, cigarettes, uncomplicated: Secondary | ICD-10-CM | POA: Diagnosis present

## 2021-11-20 DIAGNOSIS — S72011A Unspecified intracapsular fracture of right femur, initial encounter for closed fracture: Secondary | ICD-10-CM | POA: Diagnosis present

## 2021-11-20 DIAGNOSIS — J90 Pleural effusion, not elsewhere classified: Secondary | ICD-10-CM | POA: Diagnosis present

## 2021-11-20 DIAGNOSIS — Z515 Encounter for palliative care: Secondary | ICD-10-CM

## 2021-11-20 DIAGNOSIS — I10 Essential (primary) hypertension: Secondary | ICD-10-CM | POA: Diagnosis present

## 2021-11-20 DIAGNOSIS — R0600 Dyspnea, unspecified: Secondary | ICD-10-CM

## 2021-11-20 DIAGNOSIS — R0902 Hypoxemia: Secondary | ICD-10-CM | POA: Diagnosis not present

## 2021-11-20 DIAGNOSIS — E559 Vitamin D deficiency, unspecified: Secondary | ICD-10-CM | POA: Diagnosis not present

## 2021-11-20 DIAGNOSIS — Z79899 Other long term (current) drug therapy: Secondary | ICD-10-CM

## 2021-11-20 DIAGNOSIS — K5900 Constipation, unspecified: Secondary | ICD-10-CM | POA: Diagnosis present

## 2021-11-20 DIAGNOSIS — Z96642 Presence of left artificial hip joint: Secondary | ICD-10-CM | POA: Diagnosis present

## 2021-11-20 DIAGNOSIS — W19XXXA Unspecified fall, initial encounter: Principal | ICD-10-CM

## 2021-11-20 DIAGNOSIS — K920 Hematemesis: Secondary | ICD-10-CM

## 2021-11-20 DIAGNOSIS — M810 Age-related osteoporosis without current pathological fracture: Secondary | ICD-10-CM | POA: Diagnosis present

## 2021-11-20 DIAGNOSIS — K7689 Other specified diseases of liver: Secondary | ICD-10-CM | POA: Diagnosis present

## 2021-11-20 DIAGNOSIS — T465X6A Underdosing of other antihypertensive drugs, initial encounter: Secondary | ICD-10-CM | POA: Diagnosis present

## 2021-11-20 DIAGNOSIS — Z681 Body mass index (BMI) 19 or less, adult: Secondary | ICD-10-CM | POA: Diagnosis not present

## 2021-11-20 DIAGNOSIS — S72002A Fracture of unspecified part of neck of left femur, initial encounter for closed fracture: Secondary | ICD-10-CM | POA: Diagnosis present

## 2021-11-20 DIAGNOSIS — I716 Thoracoabdominal aortic aneurysm, without rupture, unspecified: Secondary | ICD-10-CM | POA: Diagnosis not present

## 2021-11-20 DIAGNOSIS — R112 Nausea with vomiting, unspecified: Secondary | ICD-10-CM | POA: Diagnosis not present

## 2021-11-20 DIAGNOSIS — I714 Abdominal aortic aneurysm, without rupture, unspecified: Secondary | ICD-10-CM | POA: Diagnosis not present

## 2021-11-20 DIAGNOSIS — N179 Acute kidney failure, unspecified: Secondary | ICD-10-CM | POA: Diagnosis not present

## 2021-11-20 DIAGNOSIS — Z7189 Other specified counseling: Secondary | ICD-10-CM | POA: Diagnosis not present

## 2021-11-20 DIAGNOSIS — Z20822 Contact with and (suspected) exposure to covid-19: Secondary | ICD-10-CM | POA: Diagnosis present

## 2021-11-20 DIAGNOSIS — R911 Solitary pulmonary nodule: Secondary | ICD-10-CM | POA: Diagnosis not present

## 2021-11-20 DIAGNOSIS — Z833 Family history of diabetes mellitus: Secondary | ICD-10-CM

## 2021-11-20 DIAGNOSIS — J449 Chronic obstructive pulmonary disease, unspecified: Secondary | ICD-10-CM | POA: Diagnosis not present

## 2021-11-20 DIAGNOSIS — M4854XA Collapsed vertebra, not elsewhere classified, thoracic region, initial encounter for fracture: Secondary | ICD-10-CM | POA: Diagnosis present

## 2021-11-20 DIAGNOSIS — R062 Wheezing: Secondary | ICD-10-CM

## 2021-11-20 DIAGNOSIS — Z720 Tobacco use: Secondary | ICD-10-CM | POA: Diagnosis not present

## 2021-11-20 DIAGNOSIS — Z923 Personal history of irradiation: Secondary | ICD-10-CM

## 2021-11-20 DIAGNOSIS — R54 Age-related physical debility: Secondary | ICD-10-CM | POA: Diagnosis present

## 2021-11-20 DIAGNOSIS — R52 Pain, unspecified: Secondary | ICD-10-CM

## 2021-11-20 DIAGNOSIS — I2699 Other pulmonary embolism without acute cor pulmonale: Secondary | ICD-10-CM | POA: Diagnosis not present

## 2021-11-20 LAB — BLOOD GAS, VENOUS
Acid-Base Excess: 0.6 mmol/L (ref 0.0–2.0)
Bicarbonate: 26.9 mmol/L (ref 20.0–28.0)
Drawn by: 20501
FIO2: 95
O2 Saturation: 53.1 %
Patient temperature: 37.2
pCO2, Ven: 63.1 mmHg — ABNORMAL HIGH (ref 44.0–60.0)
pH, Ven: 7.255 (ref 7.250–7.430)
pO2, Ven: 32.7 mmHg (ref 32.0–45.0)

## 2021-11-20 LAB — RESP PANEL BY RT-PCR (FLU A&B, COVID) ARPGX2
Influenza A by PCR: NEGATIVE
Influenza B by PCR: NEGATIVE
SARS Coronavirus 2 by RT PCR: NEGATIVE

## 2021-11-20 LAB — CBC WITH DIFFERENTIAL/PLATELET
Abs Immature Granulocytes: 0.04 10*3/uL (ref 0.00–0.07)
Basophils Absolute: 0.1 10*3/uL (ref 0.0–0.1)
Basophils Relative: 1 %
Eosinophils Absolute: 0.1 10*3/uL (ref 0.0–0.5)
Eosinophils Relative: 1 %
HCT: 41.1 % (ref 39.0–52.0)
Hemoglobin: 13.2 g/dL (ref 13.0–17.0)
Immature Granulocytes: 0 %
Lymphocytes Relative: 7 %
Lymphs Abs: 0.7 10*3/uL (ref 0.7–4.0)
MCH: 28.4 pg (ref 26.0–34.0)
MCHC: 32.1 g/dL (ref 30.0–36.0)
MCV: 88.6 fL (ref 80.0–100.0)
Monocytes Absolute: 0.6 10*3/uL (ref 0.1–1.0)
Monocytes Relative: 7 %
Neutro Abs: 8.2 10*3/uL — ABNORMAL HIGH (ref 1.7–7.7)
Neutrophils Relative %: 84 %
Platelets: 224 10*3/uL (ref 150–400)
RBC: 4.64 MIL/uL (ref 4.22–5.81)
RDW: 14.3 % (ref 11.5–15.5)
WBC: 9.6 10*3/uL (ref 4.0–10.5)
nRBC: 0 % (ref 0.0–0.2)

## 2021-11-20 LAB — HEPATIC FUNCTION PANEL
ALT: 16 U/L (ref 0–44)
AST: 20 U/L (ref 15–41)
Albumin: 3.5 g/dL (ref 3.5–5.0)
Alkaline Phosphatase: 86 U/L (ref 38–126)
Bilirubin, Direct: 0.2 mg/dL (ref 0.0–0.2)
Indirect Bilirubin: 0.7 mg/dL (ref 0.3–0.9)
Total Bilirubin: 0.9 mg/dL (ref 0.3–1.2)
Total Protein: 6.6 g/dL (ref 6.5–8.1)

## 2021-11-20 LAB — BASIC METABOLIC PANEL
Anion gap: 10 (ref 5–15)
BUN: 12 mg/dL (ref 8–23)
CO2: 25 mmol/L (ref 22–32)
Calcium: 9.2 mg/dL (ref 8.9–10.3)
Chloride: 101 mmol/L (ref 98–111)
Creatinine, Ser: 1.04 mg/dL (ref 0.61–1.24)
GFR, Estimated: >60 mL/min (ref >60)
Glucose, Bld: 90 mg/dL (ref 70–99)
Potassium: 3.6 mmol/L (ref 3.5–5.1)
Sodium: 136 mmol/L (ref 135–145)

## 2021-11-20 LAB — TYPE AND SCREEN
ABO/RH(D): AB POS
Antibody Screen: NEGATIVE

## 2021-11-20 LAB — PROTIME-INR
INR: 1.1 (ref 0.8–1.2)
Prothrombin Time: 14.2 seconds (ref 11.4–15.2)

## 2021-11-20 LAB — D-DIMER, QUANTITATIVE: D-Dimer, Quant: >20 ug/mL-FEU — ABNORMAL HIGH (ref 0.00–0.50)

## 2021-11-20 LAB — GLUCOSE, CAPILLARY: Glucose-Capillary: 105 mg/dL — ABNORMAL HIGH (ref 70–99)

## 2021-11-20 MED ORDER — MORPHINE SULFATE (PF) 4 MG/ML IV SOLN
4.0000 mg | INTRAVENOUS | Status: AC | PRN
  Administered 2021-11-20 (×2): 4 mg via INTRAVENOUS
  Filled 2021-11-20 (×2): qty 1

## 2021-11-20 MED ORDER — NICOTINE 21 MG/24HR TD PT24
21.0000 mg | MEDICATED_PATCH | Freq: Every day | TRANSDERMAL | Status: DC
  Administered 2021-11-22 – 2021-11-23 (×2): 21 mg via TRANSDERMAL
  Filled 2021-11-20 (×3): qty 1

## 2021-11-20 MED ORDER — CHLORHEXIDINE GLUCONATE 4 % EX LIQD
60.0000 mL | Freq: Once | CUTANEOUS | Status: AC
  Administered 2021-11-21: 4 via TOPICAL
  Filled 2021-11-20: qty 60

## 2021-11-20 MED ORDER — MORPHINE SULFATE (PF) 2 MG/ML IV SOLN: 1.0000 mg | INTRAVENOUS | Status: DC | PRN

## 2021-11-20 MED ORDER — ACETAMINOPHEN 325 MG PO TABS
650.0000 mg | ORAL_TABLET | Freq: Four times a day (QID) | ORAL | Status: DC
  Administered 2021-11-21: 650 mg via ORAL
  Filled 2021-11-20: qty 2

## 2021-11-20 MED ORDER — LORAZEPAM 2 MG/ML IJ SOLN
0.5000 mg | Freq: Once | INTRAMUSCULAR | Status: AC
  Administered 2021-11-20: 0.5 mg via INTRAVENOUS
  Filled 2021-11-20: qty 1

## 2021-11-20 MED ORDER — LISINOPRIL 20 MG PO TABS
20.0000 mg | ORAL_TABLET | Freq: Every day | ORAL | Status: DC
  Administered 2021-11-20 – 2021-11-22 (×3): 20 mg via ORAL
  Filled 2021-11-20 (×3): qty 1

## 2021-11-20 MED ORDER — ENSURE PRE-SURGERY PO LIQD
296.0000 mL | Freq: Once | ORAL | Status: AC
  Administered 2021-11-21: 296 mL via ORAL
  Filled 2021-11-20: qty 296

## 2021-11-20 MED ORDER — IPRATROPIUM-ALBUTEROL 0.5-2.5 (3) MG/3ML IN SOLN
3.0000 mL | Freq: Four times a day (QID) | RESPIRATORY_TRACT | Status: DC | PRN
  Administered 2021-11-21 – 2021-11-27 (×7): 3 mL via RESPIRATORY_TRACT
  Filled 2021-11-20 (×7): qty 3

## 2021-11-20 MED ORDER — HYDROMORPHONE HCL 1 MG/ML IJ SOLN
0.5000 mg | Freq: Once | INTRAMUSCULAR | Status: AC
  Administered 2021-11-20: 0.5 mg via INTRAVENOUS
  Filled 2021-11-20: qty 1

## 2021-11-20 MED ORDER — AMLODIPINE BESYLATE 2.5 MG PO TABS
2.5000 mg | ORAL_TABLET | Freq: Every day | ORAL | Status: DC
  Administered 2021-11-21 – 2021-11-22 (×2): 2.5 mg via ORAL
  Filled 2021-11-20 (×3): qty 1

## 2021-11-20 MED ORDER — ACETAMINOPHEN 650 MG RE SUPP: 650.0000 mg | Freq: Four times a day (QID) | RECTAL | Status: DC

## 2021-11-20 MED ORDER — TRAMADOL HCL 50 MG PO TABS
50.0000 mg | ORAL_TABLET | Freq: Four times a day (QID) | ORAL | Status: DC | PRN
  Administered 2021-11-21 – 2021-11-24 (×4): 50 mg via ORAL
  Filled 2021-11-20 (×4): qty 1

## 2021-11-20 MED ORDER — ONDANSETRON HCL 4 MG/2ML IJ SOLN
4.0000 mg | Freq: Once | INTRAMUSCULAR | Status: AC
  Administered 2021-11-20: 4 mg via INTRAVENOUS
  Filled 2021-11-20: qty 2
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
## 2021-11-20 NOTE — ED Provider Notes (Signed)
[facility] EMERGENCY DEPARTMENT Provider Note   CSN: [id number] Arrival date & time: 11/29/2021  1314     History  Chief Complaint  Patient presents with   Fall   Right Hip Pain    Sergio Chavez is a 81 y.o. male.  The history is provided by the patient (And his son).  Fall Patient has history of hypertension, chronic cigarette use, prior left hip fracture and throat cancer. Patient presents to the ED for evaluation after a fall.  Patient states he was walking trying to go inside the door.  He had his walker beside him and he ended up losing his balance and falling.  Patient denies feeling weak or dizzy.  He denies hitting his head or losing consciousness.  He denies headache neck pain or back pain.  Patient is complaining pain in his right hip area.  He also has some pain in his left ankle.  He states it is hard to stand and cannot walk.     Home Medications Prior to Admission medications   Medication Sig Start Date End Date Taking? Authorizing Provider  acetaminophen (TYLENOL) 325 MG tablet Take 2 tablets (650 mg total) by mouth every 6 (six) hours as needed for mild pain or moderate pain. 12/07/20   Ainsley Spinner, PA-C  albuterol (PROVENTIL) (2.5 MG/3ML) 0.083% nebulizer solution Take 3 mLs (2.5 mg total) by nebulization every 2 (two) hours as needed for wheezing or shortness of breath. Patient not taking: Reported on 08/31/2021 12/16/20   Yvonna Alanis, NP  amLODipine (NORVASC) 2.5 MG tablet Take 1 tablet (2.5 mg total) by mouth daily. For HTN 12/16/20   Fargo, Amy E, NP  diclofenac Sodium (VOLTAREN) 1 % GEL Apply 2 g topically 4 (four) times daily. Patient not taking: Reported on 08/31/2021 12/08/20   Geradine Girt, DO  enoxaparin (LOVENOX) 40 MG/0.4ML injection Inject 0.4 mLs (40 mg total) into the skin daily. 12/16/20 01/15/21  Fargo, Amy E, NP  feeding supplement (ENSURE ENLIVE / ENSURE PLUS) LIQD Take 237 mLs by mouth 2 (two) times daily between meals. 12/07/20    Geradine Girt, DO  HYDROcodone-acetaminophen (NORCO/VICODIN) 5-325 MG tablet Take 0.5-1 tablets by mouth every 6 (six) hours as needed for severe pain. 08/31/21   Carlisle Cater, PA-C  lisinopril (ZESTRIL) 20 MG tablet Take 1 tablet (20 mg total) by mouth daily. 12/16/20   Fargo, Amy E, NP  pravastatin (PRAVACHOL) 40 MG tablet Take 1 tablet (40 mg total) by mouth daily. 12/16/20   Fargo, Amy E, NP  Vitamin D3 (VITAMIN D) 25 MCG tablet Take 2 tablets (2,000 Units total) by mouth 2 (two) times daily. 12/16/20   Yvonna Alanis, NP      Allergies    Patient has no known allergies.    Review of Systems   Review of Systems  Constitutional:  Negative for fever.   Physical Exam Updated Vital Signs BP (!) 150/90    Pulse 70    Temp 97.6 F (36.4 C) (Oral)    Resp 18    Ht 1.727 m (5\' 8" )    Wt 54.9 kg    SpO2 97%    BMI 18.40 kg/m  Physical Exam Vitals and nursing note reviewed.  Constitutional:      Appearance: He is well-developed.     Comments: Elderly, frail  HENT:     Head: Normocephalic and atraumatic.     Right Ear: External ear normal.  Left Ear: External ear normal.  Eyes:     General: No scleral icterus.       Right eye: No discharge.        Left eye: No discharge.     Conjunctiva/sclera: Conjunctivae normal.  Neck:     Trachea: No tracheal deviation.  Cardiovascular:     Rate and Rhythm: Normal rate and regular rhythm.  Pulmonary:     Effort: Pulmonary effort is normal. No respiratory distress.     Breath sounds: Normal breath sounds. No stridor. No wheezing or rales.  Abdominal:     General: Bowel sounds are normal. There is no distension.     Palpations: Abdomen is soft.     Tenderness: There is no abdominal tenderness. There is no guarding or rebound.  Musculoskeletal:        General: Tenderness present. No deformity.     Cervical back: Neck supple.     Comments: Tenderness palpation right hip and left ankle, no shortening of the extremity, no deformity, no  bruising to the skin, no abrasions or lacerations noted  Skin:    General: Skin is warm and dry.     Findings: No rash.  Neurological:     General: No focal deficit present.     Mental Status: He is alert.     Cranial Nerves: No cranial nerve deficit (no facial droop, extraocular movements intact, no slurred speech).     Sensory: No sensory deficit.     Motor: No abnormal muscle tone or seizure activity.     Coordination: Coordination normal.  Psychiatric:        Mood and Affect: Mood normal.    ED Results / Procedures / Treatments   Labs (all labs ordered are listed, but only abnormal results are displayed) Labs Reviewed  CBC WITH DIFFERENTIAL/PLATELET - Abnormal; Notable for the following components:      Result Value   Neutro Abs 8.2 (*)    All other components within normal limits  BASIC METABOLIC PANEL  PROTIME-INR  TYPE AND SCREEN    EKG EKG Interpretation  Date/Time:  Sunday November 20 2021 13:17:17 EST Ventricular Rate:  71 PR Interval:  197 QRS Duration: 88 QT Interval:  430 QTC Calculation: 468 R Axis:   61 Text Interpretation: Sinus rhythm Minimal ST depression, lateral leads No significant change since last tracing Confirmed by Dorie Rank [phone]) on 11/10/2021 1:25:19 PM  Radiology DG Ankle Complete Left  Result Date: 11/12/2021 CLINICAL DATA:  Unwitnessed fall EXAM: LEFT ANKLE COMPLETE - 3+ VIEW; RIGHT ANKLE - COMPLETE 3+ VIEW COMPARISON:  None. FINDINGS: There is no evidence of fracture, dislocation, or joint effusion. There is no evidence of arthropathy or other focal bone abnormality. Soft tissues are unremarkable. IMPRESSION: No fracture or dislocation of the bilateral ankles. Electronically Signed   By: Delanna Ahmadi M.D.   On: 11/13/2021 14:49   DG Ankle Complete Right  Result Date: 11/22/2021 CLINICAL DATA:  Unwitnessed fall EXAM: LEFT ANKLE COMPLETE - 3+ VIEW; RIGHT ANKLE - COMPLETE 3+ VIEW COMPARISON:  None. FINDINGS: There is no evidence of  fracture, dislocation, or joint effusion. There is no evidence of arthropathy or other focal bone abnormality. Soft tissues are unremarkable. IMPRESSION: No fracture or dislocation of the bilateral ankles. Electronically Signed   By: Delanna Ahmadi M.D.   On: 11/13/2021 14:49   DG Hip Unilat With Pelvis 2-3 Views Right  Result Date: 11/24/2021 CLINICAL DATA:  Fall, hip pain EXAM: DG HIP (WITH  OR WITHOUT PELVIS) 2-3V RIGHT COMPARISON:  12/04/2020 FINDINGS: Diffuse bony demineralization. Findings suspicious for a minimally impacted right femoral neck fracture, best seen on frog-lateral view. Pelvic bony ring appears intact without fracture or diastasis. Prior left hip hemiarthroplasty. Atherosclerotic calcifications are present. IMPRESSION: Findings suspicious for a minimally impacted right femoral neck fracture. Consider CT or MRI for confirmation if indicated. Electronically Signed   By: Davina Poke D.O.   On: 11/22/2021 14:48    Procedures Procedures    Medications Ordered in ED Medications  HYDROmorphone (DILAUDID) injection 0.5 mg (has no administration in time range)  morphine (PF) 4 MG/ML injection 4 mg (4 mg Intravenous Given 11/29/2021 1531)  ondansetron (ZOFRAN) injection 4 mg (4 mg Intravenous Given 11/27/2021 1438)    ED Course/ Medical Decision Making/ A&P Clinical Course as of 12/02/2021 1542  Sun Nov 20, 2021  1521 DG Ankle Complete Right Ankle x-rays without fracture [JK]  1522 DG Hip Unilat With Pelvis 2-3 Views Right Hip x-ray suggest possible femoral neck fracture [JK]  1522 CBC WITH DIFFERENTIAL(!) Normal [JK]    Clinical Course User Index [JK] Dorie Rank, MD                           Medical Decision Making Amount and/or Complexity of Data Reviewed Labs: ordered. Decision-making details documented in ED Course. Radiology: ordered. Decision-making details documented in ED Course.  Risk Prescription drug management.   Patient presented to the ED for evaluation hip  pain after mechanical fall.  Normal neurologic exam.  No findings to suggest stroke or serious head injury.  Patient's initial x-rays do not show any definitive fracture but suggest possibility of femoral neck fracture.  Patient is not able to ambulate properly.  We will proceed with CT scan.  Care turned over to oncoming team        Final Clinical Impression(s) / ED Diagnoses Hip injury       Dorie Rank, MD 11/12/2021 1542

## 2021-11-20 NOTE — Progress Notes (Signed)
Received report from ED nurse that pt was ready for transport and that pt on 5 L [place]. She stated  the pt will be on noreberather mask for transport. At 2025, pt  arrived from ED Via NT on Non- rebreather mask. Pt was accompanied by daughter at the bedside. Upon assessement of the pt, he was lethargic and hard to arouse and was having shallow breathing using his accessory muscle to breath. VS taken, pt  oxygen saturation was  on 70s on 5 L [place]. Placed  pt on Non- rebreather mask and pt saturation was still on 70s. RRT were called twice without respond. Code blue was called for further evalution.

## 2021-11-20 NOTE — Significant Event (Addendum)
Rapid Response Event Note   Reason for Call : Near respiratory arrest (code blue) Initial Focused Assessment:  Code Blue initiated due to respiratory arrest. Pt never lost pulse or stopped breathing. Upon my arrival, pt was agonally breathing with a NRB mask on at 15L. Pt was recently admitted this evening from the ED. Pt received narcotics while in ED (Dilaudid, Morphine) and Ativan. Pt was immediately given Narcan 0.4mg  IV and woke up quickly. He was able to follow simple commands.  BP is elevated 229/133 (verified in both arms) and will receive his po home meds (Norvasc and lisinopril) to manage his HTN. Pt does endorse some pain in his hip but when not stimulated he is resting comfortably.   2055-97.38F, 105 ST, 229/133, RR 22 with sats 91-93% on 6L [place].    Interventions:  -NRB mask (weaned back to [place]) -0.4 mg Narcan IV stat  Plan of Care:  -Monitor for more somulence -cautious use of sedating medications. Preferably use PO meds for narcotics rather than IV and adjuncts like toradol if appropriate.  -Notify primary svc and/or RRRN for additional assistance.     MD Notified: At Bedside Call Time: 2034 Arrival Time: 2038 End Time: 2127  Madelynn Done, RN

## 2021-11-20 NOTE — Progress Notes (Signed)
°   11/18/2021 2100  Clinical Encounter Type  Visited With Patient;Family;Patient and family together  Visit Type Initial;Code  Referral From Nurse  Consult/Referral To Chaplain  Spiritual Encounters  Spiritual Needs Prayer;Emotional  Stress Factors  Patient Stress Factors Loss of control  Family Stress Factors Loss of control;Major life changes   Chaplain was called for CODE .  Met with daughter in hallway while staff were working on patient.  Provided emotional support. Met with patient once he was stabilized and alert.  Continued to provide support for family and staff.  Will remain available as needed.  No spiritual distress exhibited by either patient or family.

## 2021-11-20 NOTE — Progress Notes (Addendum)
Subjective: ON: CODE BLUE initiated for near respiratory arrest. Patient arrived to the floor on 5-6 L [place].  Notably, patient was given morphine 4 mg twice, Dilaudid 0.5 mg for left hip pain and Ativan 0.5 mg for agitation.  Patient never lost pulse or stopped breathing.  Rapid response reports, on their arrival patient was obtunded, agonal breathing with NRB mask at 15L. Narcan 0.4 mg IV was given with improvement in patient's respiratory status (92% on 6L [place]), coarse breath sounds bilaterally without crackles or wheezing. Cardiac monitor showing sinus tach, HR 105. Patient woke, was lethargic, would wake to verbal stim, able to follow simple commands. Remained altered, unable to recognize patient's daughter who was present in the room. Endorsing hip pain. Blood pressure was elevated to 229/133. Passed bedside swallow screen and was able to take his PO BP meds. He was later found to be sleeping comfortably. CXR not concerning for acute pathology.   Patient was seen at bedside during rounds today. Pt is more interactive today compared to yesterday. Daughter is at bedside and is able to assist with conversation. Pt reports pain in upper back, requesting pain medications. He does not complain of abdominal pain.   Objective:  Vital signs in last 24 hours: Vitals:   11/29/2021 1700 11/22/2021 1715 11/30/2021 1805 11/22/2021 1845  BP: (!) 145/92 (!) 151/98 (!) 206/113 132/86  Pulse: 82 78 (!) 105 (!) 101  Resp: 13 14 13  (!) 21  Temp:      TempSrc:      SpO2: 90% 98% 99% 98%  Weight:      Height:       Constitutional: chronically ill-appearing, anxious, and in NAD  Eyes: conjunctiva non-erythematous, EOMI Cardiovascular: RRR, no m/r/g, non-edematous bilateral LE Pulmonary/Chest: normal work of breathing on supplemental O2, LCTAB Abdominal: non-distended MSK: normal bulk and tone  Neurological: follows commands  Skin: warm and dry, some bruises along left arm and right hand     Assessment/Plan:  Principal Problem:   Femoral neck fracture (HCC)  Acute right femoral neck fracture Hx of left hemiarthroplasty 11/2020  Vitamin D insuffiencey   CT with acute right femoral neck fracture; plan was pinning per ortho today at noon. Risk factor includes vitamin D insufficieny, 24 (12/2020). Repeat level pending, likely low in setting of Vit D supplementation non-compliance. Complains of pain d/t this. Talked to orthopedic surgeon this morning, who states that thoracoabdominal aortic aneurysm (see below) prohibits safely operating at this time, with no indication for urgent pinning given pt not mobile at baseline. Would benefit from palliative consult.  - Pain management: tylenol 650 q6h, tramadol 50 mg q6h prn, morphine 2mg  IV q4h prn  - PT/OT  - Wean off of O2 as tolerated; suspect he lives near 88-93% and can be weaned off soon. - Ortho following peripherally  - Palliative care consulted, appreciate assistance   Thoracoabdominal aortic aneurysm Vascular consulted by ON team per ortho request given concern for incompletely imaged AAA found during workup for right femoral neck fracture. Vascular not concerned for near rupturing aneurysm, but concerned about proceeding to OR prior to addressing the aneurysm. CTA showing 7.7 x 7.3 cm mostly thrombosed infrarenal AAA extending 8.2 cm in length, with descending aortic scattered ulcerative soft plaque without penetrating ulcer or dissection. Not candidate for open surgery d/t comorbidities. Options include infrarenal AAA repair with bilateral femoral endartectomies (possible Wednesday) vs transfer to [facility] vs palliative care. Family would like some time to think about the options; repair  is non-emergent at this time.  - Vascular following  - palliative care consulted, appreciate assistance   Severe symptomatic hypertension, resolved.  Hx of HTN  BP in 220/120's in the setting of severe pain and not taking BP medications yesterday  morning. Was able to take PO BP medications last night. BP better controlled this morning, in 140/80's. Ongoing pain likely contributing.   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 Smoking cessation has been encouraged in the past. Pt refuses to use nicotine patches per family. He is not interested in quitting at this time. 1 cm incidental irregular nodule concerning for neoplasm found on CTA this admission.  - Nicotine patches  - Needs outpatient follow up for possible biopsy - Palliative care consulted  Incidental CTA findings  Numerous hepatic cysts, and additional too small to characterize hypodensities in the left kidney.    Best Practice: Diet: NPO  IVF: None,None VTE: SCDs Code: Full   Lajean Manes, MD  Internal Medicine Resident, PGY-1 Pager: [phone] After 5pm on weekdays and 1pm on weekends: On Call pager [phone]

## 2021-11-20 NOTE — Progress Notes (Signed)
Significant Event Note: 9:29 PM 11/18/2021  Per RN staff, rapid called twice before CODE BLUE was initiated for near respiratory arrest.  Patient had just arrived to the floor from the ED (admitted for left hip fracture and pain control), on 5-6 L [place].  Notably, patient was given morphine 4 mg twice, Dilaudid 0.5 mg for left hip pain and Ativan 0.5 mg for agitation.  Patient never lost pulse or stopped breathing.  Rapid response reports, on their arrival patient was obtunded, agonal breathing with NRB mask at 15L.  Narcan 0.4 mg IV was given with improvement in patient's respiratory status (92% on 6L [place]), coarse breath sounds bilaterally without crackles or wheezing.  Cardiac monitor showing sinus tach, HR 105. Patient woke, was lethargic, would wake to verbal stim, able to follow simple commands.  Remained altered, unable to recognize patient's daughter who was present in the room.  Endorsing hip pain.  Blood pressure was elevated to 229/133.  Patient was alert enough to try bedside swallow which was passed.  Patient was given home medications amlodipine 2.5 mg and lisinopril 20 mg p.o. with slow improvement in BP.  Discussed event with patient's daughter and answered all questions.  Patient sleeping comfortably at this time.  Orders placed: -CXR -D-dimer -Continuous telemetry -VBG -DuoNeb as needed  Wayland Denis, MD 11/15/2021,  9:49 PM Pager: [phone] Internal Medicine Resident, PGY-1 [facility]

## 2021-11-20 NOTE — Progress Notes (Signed)
Rt responded to code blue called on this pt. Upon arrival, pt had never lost pulses needing CPR nor needed bagging.  Pt was on NRB at 15LPM and [place] at 15lpm.  Pt was unable to respond appropriately.  RTx2 at bedside on standby with AMBU bag.  Pt was given Narcan and returned to somewhat normal.  Rt placed [place] at 6lpm at this time and sats were 95-98%.  RT will continue to monitor as needed.

## 2021-11-20 NOTE — Consult Note (Signed)
[facility] [place]  ASSESSMENT / PLAN: Sergio Chavez is a 81 y.o. male with a large (50mm or greater) incompletely imaged abdominal aortic aneurysm found during workup for right femoral neck fracture.  A statement from the [place] [employer] and Society for Vascular Surgery estimated the annual rupture risk according to AAA diameter to be the following: 7.0 cm to 7.9 cm in diameter - 20% to 40%  Recommend the following to reduce the risk of major adverse cardiac / limb events. Complete cessation from all tobacco products. Excellent blood glucose control with goal A1c < 7%. Blood pressure control with goal blood pressure < 140/90 mmHg. Excellent lipid reduction therapy with goal LDL-C <100 mg/dL. Aspirin 81mg  PO QD.  Atorvastatin 40-80mg  PO QD (or other "high intensity" statin therapy).  Discussed in detail with the patient's daughter at the bedside.  Our first priority will be obtaining adequate imaging of the aneurysm.  I do not suspect this is asymptomatic or near rupturing aneurysm.    I am concerned about proceeding to the operating room for orthopedic surgery before treating this aneurysm, and would recommend we wait until the CT angiogram is available and we are able to discuss a combined approach.  If he is a candidate for minimally invasive repair (EVAR), I would recommend proceeding with EVAR tomorrow prior to orthopedic repair.  If he is not a candidate for EVAR, palliative care may be his best option.  We will await CT angiogram and discuss further with orthopedic surgery team in the morning.  CHIEF COMPLAINT: Right hip pain  HISTORY OF PRESENT ILLNESS: Sergio Chavez is a 81 y.o. male admitted to the internal medicine service for treatment of a right femoral neck fracture sustained earlier this morning during a ground-level fall.  A CT scan of his right hip was performed.  This incidentally noted a 75 mm  abdominal aortic aneurysm.  This did not completely image the aneurysm.  The patient had a "near code" event earlier today likely from over administration of narcotics.  He is somnolent on my exam.  He is arousable, but quickly falls back asleep.  His daughter is at the bedside.  Per her report, he has known about this aneurysm for some time, but has delayed outpatient evaluation.  Past Medical History:  Diagnosis Date   Hypertension    Throat cancer [facility])     Past Surgical History:  Procedure Laterality Date   BACK SURGERY     HIP ARTHROPLASTY Left 12/04/2020   Procedure: HIP HEMIARTHROPLASTY;  Surgeon: Altamese Beluga, MD;  Location: [place];  Service: Orthopedics;  Laterality: Left;   PROSTATE SURGERY      Family History  Problem Relation Age of Onset   Diabetes Mellitus II Father     Social History   Socioeconomic History   Marital status: Married    Spouse name: Not on file   Number of children: Not on file   Years of education: Not on file   Highest education level: Not on file  Occupational History   Not on file  Tobacco Use   Smoking status: Every Day    Packs/day: 0.50    Types: Cigarettes   Smokeless tobacco: Never  Vaping Use   Vaping Use: Never used  Substance and Sexual Activity   Alcohol use: No   Drug use: Never   Sexual activity: Not on file  Other Topics Concern   Not on file  Social History Narrative   Not on file   Social Determinants of Health   Financial Resource Strain: Not on file  Food Insecurity: Not on file  Transportation Needs: Not on file  Physical Activity: Not on file  Stress: Not on file  Social Connections: Not on file  Intimate Partner Violence: Not on file    No Known Allergies  Current Facility-Administered Medications  Medication Dose Route Frequency Provider Last Rate Last Admin   acetaminophen (TYLENOL) tablet 650 mg  650 mg Oral Q6H Rick Duff, MD       Or   acetaminophen (TYLENOL) suppository 650 mg  650 mg  Rectal Q6H Rick Duff, MD       [START ON 12/01/2021] amLODipine (NORVASC) tablet 2.5 mg  2.5 mg Oral Daily Rick Duff, MD       ipratropium-albuterol (DUONEB) 0.5-2.5 (3) MG/3ML nebulizer solution 3 mL  3 mL Nebulization Q6H PRN Iona Beard, MD       [START ON 11/17/2021] lisinopril (ZESTRIL) tablet 20 mg  20 mg Oral Daily Rick Duff, MD   20 mg at 12/01/2021 2057   morphine (PF) 2 MG/ML injection 1 mg  1 mg Intravenous Q2H PRN Rick Duff, MD       nicotine (NICODERM CQ - dosed in mg/24 hours) patch 21 mg  21 mg Transdermal Daily Rick Duff, MD       traMADol Veatrice Bourbon) tablet 50 mg  50 mg Oral Q6H PRN Rick Duff, MD        REVIEW OF SYSTEMS:  [X]  denotes positive finding, [ ]  denotes negative finding Cardiac  Comments:  Chest pain or chest pressure:    Shortness of breath upon exertion:    Short of breath when lying flat:    Irregular heart rhythm:        Vascular    Pain in calf, thigh, or hip brought on by ambulation:    Pain in feet at night that wakes you up from your sleep:     Blood clot in your veins:    Leg swelling:         Pulmonary    Oxygen at home:    Productive cough:     Wheezing:         Neurologic    Sudden weakness in arms or legs:     Sudden numbness in arms or legs:     Sudden onset of difficulty speaking or slurred speech:    Temporary loss of vision in one eye:     Problems with dizziness:         Gastrointestinal    Blood in stool:     Vomited blood:         Genitourinary    Burning when urinating:     Blood in urine:        Psychiatric    Major depression:         Hematologic    Bleeding problems:    Problems with blood clotting too easily:        Skin    Rashes or ulcers:        Constitutional    Fever or chills:      PHYSICAL EXAM Vitals:   11/22/2021 2125 12/03/2021 2127 11/19/2021 2146 11/30/2021 2214  BP: (!) 162/97 (!) 163/105 139/79 134/83  Pulse:    92  Resp:    18  Temp:    98.7 F (37.1  C)  TempSrc:    Axillary  SpO2: 91% 90% 94% 98%  Weight:      Height:        Constitutional: Elderly.  Somnolent.  No acute distress.   Neurologic: Somnolent.  Not able to participate in exam. Psychiatric: Somnolent.  Not able to participate in exam Eyes: No icterus. No conjunctival pallor. Ears, nose, throat: mucous membranes moist. Midline trachea.  Cardiac: Regular rate and rhythm.  Respiratory:  unlabored. Abdominal:  soft, non-tender, non-distended.  Palpable pulsatile mass in the epigastric abdomen.  The aneurysm is easily palpated and not tender. Peripheral vascular: Weakly palpable femoral pulses bilaterally Extremity: no edema. no cyanosis. no pallor.  Skin: no gangrene. no ulceration.  Lymphatic: no Stemmer's sign. no palpable lymphadenopathy.  PERTINENT LABORATORY AND RADIOLOGIC DATA  Most recent CBC CBC Latest Ref Rng & Units 11/10/2021 12/07/2020 12/06/2020  WBC 4.0 - 10.5 K/uL 9.6 6.9 7.5  Hemoglobin 13.0 - 17.0 g/dL 13.2 11.7(L) 11.4(L)  Hematocrit 39.0 - 52.0 % 41.1 34.0(L) 32.0(L)  Platelets 150 - 400 K/uL 224 211 184     Most recent CMP CMP Latest Ref Rng & Units 11/27/2021 12/07/2020 12/06/2020  Glucose 70 - 99 mg/dL 90 100(H) 103(H)  BUN 8 - 23 mg/dL 12 20 22   Creatinine 0.61 - 1.24 mg/dL 1.04 0.99 0.92  Sodium 135 - 145 mmol/L 136 128(L) 127(L)  Potassium 3.5 - 5.1 mmol/L 3.6 3.6 3.8  Chloride 98 - 111 mmol/L 101 96(L) 93(L)  CO2 22 - 32 mmol/L 25 24 21(L)  Calcium 8.9 - 10.3 mg/dL 9.2 9.0 9.1  Total Protein 6.5 - 8.1 g/dL 6.6 - -  Total Bilirubin 0.3 - 1.2 mg/dL 0.9 - -  Alkaline Phos 38 - 126 U/L 86 - -  AST 15 - 41 U/L 20 - -  ALT 0 - 44 U/L 16 - -    Renal function Estimated Creatinine Clearance: 44 mL/min (by C-G formula based on SCr of 1.04 mg/dL).  Yevonne Aline. Stanford Breed, MD [facility] Phone Number: [phone] 11/11/2021 10:57 PM  Total time spent on preparing this encounter including chart review, data  review, collecting history, examining the patient, coordinating care for this new patient, 60 minutes.  Portions of this report may have been transcribed using voice recognition software.  Every effort has been made to ensure accuracy; however, inadvertent computerized transcription errors may still be present.

## 2021-11-20 NOTE — H&P (Signed)
Date: 11/14/2021               Patient Name:  Sergio Chavez MRN: [id number]  DOB: 1941/08/16 Age / Sex: 81 y.o., male   PCP: Powers, Charolotte Eke, MD         Medical Service: Internal Medicine Teaching Service         Attending Physician: Dr. Dareen Piano    First Contact: Lajean Manes, MD Pager: [phone]  Second Contact: Rick Duff, MD Pager: [phone]       After Hours (After 5p/  First Contact Pager: [phone]  weekends / holidays): Second Contact Pager: [phone]    Chief Complaint: fall   History of Present Illness:  Sergio Chavez is a 81 y.o. male with a pertinent PMH of HTN, prior left hip fracture and throat cancer presenting to the ED for evaluation after a fall. He complained of right hip pain on arrival to the ED. He stated that it was difficult to stand and that he could not bear walking due to pain.   Pt received 4 mg IV morphine and 0.50 mg IV dilaudid in the ED for pain. He became agitated and increasing anxious and EDP gave 0.50mg  of Ativan for anxiety. He is sedated and difficult to arouse after receiving these medications. Fortunately, daughter and son-in-law are at bedside and are able to assist with the history.   Patient was outside his home going inside with the use of his walker, when he lost balance and fell to the ground. Pt did not hit his head or lose consciousness. No complaints of vision changes, slurred speech, headache, chest pain, SHOB, weakness and dizziness. Family reports that he did not take his BP meds this AM.   Patient is a regular smoker and does not wear O2 at home. He was initially placed on 4L [place] satting at 100%; turned down to 2L [place], satting between 88-93%.   XR suspicious for right femoral neck fracture. Not on any anticoagulation. CT of right hip showing acute right femoral neck fracture. XR of ankles negative for fracture or dislocation. Initial ED workup with labs unremarkable. EDP consulted ortho; plan for pinning at noon tomorrow.    Meds:   No current facility-administered medications on file prior to encounter.   Current Outpatient Medications on File Prior to Encounter  Medication Sig Dispense Refill   acetaminophen (TYLENOL) 325 MG tablet Take 2 tablets (650 mg total) by mouth every 6 (six) hours as needed for mild pain or moderate pain. 90 tablet 0   amLODipine (NORVASC) 2.5 MG tablet Take 1 tablet (2.5 mg total) by mouth daily. For HTN (Patient taking differently: Take 2.5 mg by mouth daily.) 30 tablet 0   diclofenac Sodium (VOLTAREN) 1 % GEL Apply 2 g topically 4 (four) times daily. (Patient taking differently: Apply 2 g topically daily as needed (pain).)     lisinopril (ZESTRIL) 20 MG tablet Take 1 tablet (20 mg total) by mouth daily. 30 tablet 0   Menthol (ICY HOT) 5 % PTCH Apply 1 patch topically daily as needed (pain).     pravastatin (PRAVACHOL) 40 MG tablet Take 1 tablet (40 mg total) by mouth daily. (Patient taking differently: Take 40 mg by mouth at bedtime.) 30 tablet 0   albuterol (PROVENTIL) (2.5 MG/3ML) 0.083% nebulizer solution Take 3 mLs (2.5 mg total) by nebulization every 2 (two) hours as needed for wheezing or shortness of breath. (Patient not taking: Reported on 08/31/2021) 3 mL 0  enoxaparin (LOVENOX) 40 MG/0.4ML injection Inject 0.4 mLs (40 mg total) into the skin daily. (Patient not taking: Reported on 11/12/2021) 4.8 mL 0   feeding supplement (ENSURE ENLIVE / ENSURE PLUS) LIQD Take 237 mLs by mouth 2 (two) times daily between meals. (Patient not taking: Reported on 12/04/2021) 237 mL 12   HYDROcodone-acetaminophen (NORCO/VICODIN) 5-325 MG tablet Take 0.5-1 tablets by mouth every 6 (six) hours as needed for severe pain. (Patient not taking: Reported on 11/24/2021) 6 tablet 0   Vitamin D3 (VITAMIN D) 25 MCG tablet Take 2 tablets (2,000 Units total) by mouth 2 (two) times daily. (Patient not taking: Reported on 11/18/2021) 120 tablet 0    Allergies: Allergies as of 11/19/2021   (No Known Allergies)    Past Medical History:  Diagnosis Date   Hypertension    Throat cancer ([place])    Family History:  Family History  Problem Relation Age of Onset   Diabetes Mellitus II Father    Social History:   Lives with wife in [place] about 1 ppd for >60 years.  No alcohol or illicit drug use IADLs/ADLs- requires some assistance baseline   Review of Systems: A complete ROS was negative except as per HPI.    Physical Exam: Blood pressure (!) 206/113, pulse (!) 105, temperature 97.6 F (36.4 C), temperature source Oral, resp. rate 13, height 5\' 8"  (1.727 m), weight 54.9 kg, SpO2 99 %.  Constitutional: asleep, not arousable   HENT: normocephalic, atraumatic, mucous membranes moist Cardiovascular: RRR, no m/r/g, non-edematous bilateral LE Pulmonary/Chest: normal work of breathing on room air, no difficulty breathing on 2L supplemental O2.  Abdominal: soft, non-tender to palpation, non-distended MSK: normal bulk and tone Neurological: unable to assess orientation d/t sedation  Skin: warm and dry, some bruises along left arm and right hand   EKG: SR  Assessment & Plan by Problem: Principal Problem:   Femoral neck fracture (HCC)  Acute right femoral neck fracture Vitamin D insuffiencey   Presenting with right hip pain and left ankle pain after a ground level fall today. He is s/p left hemiarthroplasty 11/2020 after a left hip fracture. XR of ankles negative. CT today revealing acute right femoral neck fracture. Risk factor includes vitamin D insufficieny, 24 in 12/2020. Was started on Vit D supplementation but patient not taking, will repeat level today. Ca level wnl at 9.2. Sedated at this time due to opioids and ativan. No indication that he is in pain right now. Ortho consulted by EDP, plan for closed reduction and percutaneous screw fixation of the right femoral neck fracture on 2/13 at noon. Not on any anticoagulation. Will need to allow some time to wash out high doses of dilaudid. -  Ortho following, plan for surgery tmrw at noon - NPO at MN  - Pain management: tylenol 650 q6h, tramadol 50 mg q6h prn, morphine 1mg  IV q2h prn  - Vitamin D level pending  - PT/OT  - Wean off of O2 as tolerated; suspect he lives near 88-93% and can be weaned off soon.  HTN  BP elevated 170/90's in setting of pain and not taking his BP medications this morning.  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 Smoking cessation has been encouraged in the past. Pt refuses to use nicotine patches per family.  He is not interested in quitting at this time.  - Nicotine patches   Code status  Family reports that pt does not have a DNR. He is currently sedated. Will re-assess his [place] when able to converse.    Best Practice: Diet: NPO at MN IVF: None,None VTE: SCDs Code: Full   Lajean Manes, MD  Internal Medicine Resident, PGY-1 Pager: [phone] 6:41 PM, 11/10/2021

## 2021-11-20 NOTE — H&P (View-Only) (Signed)
Patient ID: Sergio Chavez MRN: [id number] DOB/AGE: 02/22/1941 81 y.o.  Admit date: 12/05/2021  Admission Diagnoses:  Principal Problem:   Femoral neck fracture [facility]) Active Problems:   Closed right hip fracture (HCC)   HPI: The patient is currently admitted to the IM team at [facility]. Ortho was consulted for right femoral neck fracture sustained earlier on Sunday 2/12 morning when the patient had a ground level fall. He denies loss of consciousness. He had pain in his bilateral ankles but ED radiographs were negative. He has been household ambulator with assistive device (walker in AM, cane in PM). He lives with his elderly wife. History is provided by daughter Ms. Kipp Brood at bedside. His PMH is notable for HTN, smoking, throat cancer. He had contralateral left hip fracture for which he underwent hemiarthroplasty in Feb 2022. Since admission, notable events include near respiratory arrest (reversed with Narcan) as well as severe hypertension (up to 230/130+ mm Hg). He has also been mostly somnolent. According to his daughter, the patient has not been able to function independently since his prior left hip fracture.   Past Medical History: Past Medical History:  Diagnosis Date   Hypertension    Throat cancer [facility])     Surgical History: Past Surgical History:  Procedure Laterality Date   BACK SURGERY     HIP ARTHROPLASTY Left 12/04/2020   Procedure: HIP HEMIARTHROPLASTY;  Surgeon: Altamese Nocatee, MD;  Location: [place];  Service: Orthopedics;  Laterality: Left;   PROSTATE SURGERY      Family History: Family History  Problem Relation Age of Onset   Diabetes Mellitus II Father     Social History: Social History   Socioeconomic History   Marital status: Married    Spouse name: Not on file   Number of children: Not on file   Years of education: Not on file   Highest education level: Not on file  Occupational History   Not on file  Tobacco Use   Smoking status:  Every Day    Packs/day: 0.50    Types: Cigarettes   Smokeless tobacco: Never  Vaping Use   Vaping Use: Never used  Substance and Sexual Activity   Alcohol use: No   Drug use: Never   Sexual activity: Not on file  Other Topics Concern   Not on file  Social History Narrative   Not on file   Social Determinants of Health   Financial Resource Strain: Not on file  Food Insecurity: Not on file  Transportation Needs: Not on file  Physical Activity: Not on file  Stress: Not on file  Social Connections: Not on file  Intimate Partner Violence: Not on file    Allergies: Patient has no known allergies.  Medications: I have reviewed the patient's current medications.  Vital Signs: Patient Vitals for the past 24 hrs:  BP Temp Temp src Pulse Resp SpO2 Height Weight  11/15/2021 2146 139/79 -- -- -- -- 94 % -- --  11/14/2021 2127 (!) 163/105 -- -- -- -- 90 % -- --  11/19/2021 2125 (!) 162/97 -- -- -- -- 91 % -- --  12/01/2021 2115 (!) 181/109 -- -- -- -- -- -- --  11/22/2021 2109 (!) 197/119 -- -- (!) 104 -- 96 % -- --  11/22/2021 2101 (!) 206/114 -- -- -- -- -- -- --  11/15/2021 2052 (!) 239/123 -- -- (!) 104 -- -- -- --  12/05/2021 2050 (!) 243/138 -- -- (!) 102 -- -- -- --  11/18/2021 2047 (!) 185/170 -- -- -- -- -- -- --  11/14/2021 2046 (!) 185/170 -- -- (!) 109 -- -- -- --  12/05/2021 2041 (!) 183/104 97.6 F (36.4 C) Axillary -- -- 100 % -- --  11/17/2021 2039 (!) 183/104 97.6 F (36.4 C) Oral 100 15 100 % -- --  11/13/2021 2025 (!) 167/97 97.6 F (36.4 C) Oral (!) 104 15 (!) 75 % -- --  11/18/2021 2025 (!) 167/97 (!) 96.6 F (35.9 C) Axillary (!) 104 -- (!) 77 % -- --  11/09/2021 1930 (!) 152/97 -- -- 100 12 95 % -- --  11/11/2021 1915 134/87 -- -- 98 14 95 % -- --  11/29/2021 1845 132/86 -- -- (!) 101 (!) 21 98 % -- --  11/22/2021 1805 (!) 206/113 -- -- (!) 105 13 99 % -- --  11/18/2021 1715 (!) 151/98 -- -- 78 14 98 % -- --  11/10/2021 1700 (!) 145/92 -- -- 82 13 90 % -- --  11/15/2021 1645 (!) 175/89 --  -- 84 12 93 % -- --  12/04/2021 1615 (!) 175/91 -- -- 75 14 97 % -- --  11/14/2021 1545 (!) 152/98 -- -- 68 15 99 % -- --  11/30/2021 1530 (!) 189/96 -- -- 70 15 98 % -- --  11/24/2021 1515 (!) 150/90 -- -- 70 18 97 % -- --  12/02/2021 1502 -- -- -- 61 18 90 % -- --  12/06/2021 1500 (!) 156/82 -- -- 65 20 (!) 89 % -- --  12/04/2021 1448 -- -- -- 63 17 90 % -- --  11/16/2021 1445 139/76 -- -- 65 13 -- -- --  11/22/2021 1432 (!) 180/99 -- -- (!) 58 16 95 % -- --  11/19/2021 1345 (!) 158/88 -- -- 65 (!) 22 95 % -- --  11/10/2021 1325 -- -- -- -- -- -- 5\' 8"  (1.727 m) 54.9 kg  12/02/2021 1315 (!) 187/97 97.6 F (36.4 C) Oral 70 14 91 % -- --    Radiology: DG Chest 1 View  Result Date: 11/19/2021 CLINICAL DATA:  Low O2 sats. EXAM: CHEST  1 VIEW COMPARISON:  07/01/2021 FINDINGS: 1601 hours. Lungs are hyperexpanded. Interstitial markings are diffusely coarsened with chronic features. The cardio pericardial silhouette is enlarged. Bones are diffusely demineralized. Telemetry leads overlie the chest. IMPRESSION: Chronic interstitial coarsening. No acute cardiopulmonary findings. Electronically Signed   By: Misty Stanley M.D.   On: 11/18/2021 16:13   DG Ankle Complete Left  Result Date: 11/18/2021 CLINICAL DATA:  Unwitnessed fall EXAM: LEFT ANKLE COMPLETE - 3+ VIEW; RIGHT ANKLE - COMPLETE 3+ VIEW COMPARISON:  None. FINDINGS: There is no evidence of fracture, dislocation, or joint effusion. There is no evidence of arthropathy or other focal bone abnormality. Soft tissues are unremarkable. IMPRESSION: No fracture or dislocation of the bilateral ankles. Electronically Signed   By: Delanna Ahmadi M.D.   On: 12/02/2021 14:49   DG Ankle Complete Right  Result Date: 11/09/2021 CLINICAL DATA:  Unwitnessed fall EXAM: LEFT ANKLE COMPLETE - 3+ VIEW; RIGHT ANKLE - COMPLETE 3+ VIEW COMPARISON:  None. FINDINGS: There is no evidence of fracture, dislocation, or joint effusion. There is no evidence of arthropathy or other focal bone  abnormality. Soft tissues are unremarkable. IMPRESSION: No fracture or dislocation of the bilateral ankles. Electronically Signed   By: Delanna Ahmadi M.D.   On: 11/30/2021 14:49   CT Hip Right Wo Contrast  Addendum Date: 11/18/2021   ADDENDUM REPORT: 11/13/2021  20:03 ADDENDUM: There is an error within the Findings and Impression sections of the original report. The described femoral neck fracture is subcapital in location, not basicervical. This change was discussed with Dr. Kathaleen Bury at the time of addendum. Electronically Signed   By: Davina Poke D.O.   On: 12/06/2021 20:03   Result Date: 11/22/2021 CLINICAL DATA:  Unwitnessed fall.  Hip pain.  Abnormal x-ray EXAM: CT OF THE RIGHT HIP WITHOUT CONTRAST TECHNIQUE: Multidetector CT imaging of the right hip was performed according to the standard protocol. Multiplanar CT image reconstructions were also generated. RADIATION DOSE REDUCTION: This exam was performed according to the departmental dose-optimization program which includes automated exposure control, adjustment of the mA and/or kV according to patient size and/or use of iterative reconstruction technique. COMPARISON:  Hip x-ray 11/14/2021.  CT abdomen pelvis 12/23/2004 FINDINGS: Bones/Joint/Cartilage Acute minimally impacted basicervical fracture of the lateral aspect of the right femoral neck (series 6, images 40-47). No evidence of disruption of the medial cortex at this time. Hip joint intact without dislocation. 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 Visualized bony pelvis intact without fracture or diastasis. No lytic or sclerotic bone lesion identified. Ligaments Suboptimally assessed by CT. Muscles and Tendons No acute musculotendinous abnormality by CT. Soft tissues No fluid collection or hematoma about the hip. Infrarenal abdominal aortic aneurysm is partially imaged measuring 7.5 x 7.4 cm in transaxial dimension (series 3, image 1). There is peripheral  atherosclerotic calcification of the aneurysm sac. Heavily calcified iliac arteries. Colonic diverticulosis. IMPRESSION: 1. Acute minimally impacted basicervical fracture of the lateral aspect of the right femoral neck. 2. Infrarenal abdominal aortic aneurysm measuring up to 7.5 cm in diameter. Recommend referral to a vascular specialist. This recommendation follows ACR consensus guidelines: White Paper of the ACR Incidental Findings Committee II on Vascular Findings. J Am Coll Radiol 2013; 10:789-794. Electronically Signed: By: Davina Poke D.O. On: 11/25/2021 16:24   DG Hip Unilat With Pelvis 2-3 Views Right  Result Date: 11/22/2021 CLINICAL DATA:  Fall, hip pain EXAM: DG HIP (WITH OR WITHOUT PELVIS) 2-3V RIGHT COMPARISON:  12/04/2020 FINDINGS: Diffuse bony demineralization. Findings suspicious for a minimally impacted right femoral neck fracture, best seen on frog-lateral view. Pelvic bony ring appears intact without fracture or diastasis. Prior left hip hemiarthroplasty. Atherosclerotic calcifications are present. IMPRESSION: Findings suspicious for a minimally impacted right femoral neck fracture. Consider CT or MRI for confirmation if indicated. Electronically Signed   By: Davina Poke D.O.   On: 11/24/2021 14:48    Labs: Recent Labs    12/03/2021 1341  WBC 9.6  RBC 4.64  HCT 41.1  PLT 224   Recent Labs    12/01/2021 1341  NA 136  K 3.6  CL 101  CO2 25  BUN 12  CREATININE 1.04  GLUCOSE 90  CALCIUM 9.2   Recent Labs    11/14/2021 1341  INR 1.1    Review of Systems: ROS as detailed in HPI  Physical Exam: Body mass index is 18.4 kg/m.  Gen: Pt deeply somnolent, not readily arousable, not following commands  Right lower extremity: Log roll deferred to avoid fx displacement Skin intact Unable to test sensation/motor DP, PT 2+ CR<2s   Assessment and Plan: The patient is an 81 year male household ambulator who is admitted for right femoral neck fracture sustained  on 11/10/2021 with PMH notable for large aortic abdominal aneurysm  -history, imaging and plan reviewed at length with daughter who is in agreement -he has  an impacted subcapital incomplete fracture of the right femoral neck (directly reviewed this with interpreting radiologist) -plan for fixation of the right femoral neck fracture with cannulated screws -discussed with OR to determine room availability, plan is for surgery Monday 2/13 at noon to ensure Hana table available -please keep NPO, hold blood thinners from midnight tonight  -reviewed his comorbidities with anesthesiologist overnight: the patient will need Vascular Surgery evaluation for preoperative clearance given his massive abdominal aortic aneurysm and sustained hypertension during this admission -important: please minimize movement of patient to prevent femoral neck fracture displacement (nursing instruction and strict bedrest order placed - ok for urinal or foley, no bedside commode) -appreciate primary team care  Armond Hang, MD Orthopaedic Surgeon [facility] [phone]

## 2021-11-20 NOTE — ED Provider Notes (Signed)
I assumed care from Dr. Tomi Bamberger at [phone].  Patient with ongoing hip pain and CT pending for further evaluation.  CT showed an acute minimally impacted basicervical fracture of the lateral aspect of the right femoral neck.  On repeat evaluation patient is still having pain and anxiety.  Son is asking for something for his nerves.  He has been n.p.o. since yesterday he did not have any breakfast this morning.  Patient is on 4 L but satting almost 100% and he is a regular smoker and does not wear oxygen at home and suspect that patient's chronic O2 sat is in the low 90s.  Patient turned down to 2 L of oxygen and sats are between 88 and 93%.  Spoke with orthopedic surgeon Dr. Kathaleen Bury who requests keeping patient n.p.o. and he will need a pin and they may try to do it later today.  I independently evaluated patient's labs and interpreted them and there is no acute findings.  Patient does not take anticoagulation.  Will admit to medicine.   Blanchie Dessert, MD 11/24/2021 [phone]

## 2021-11-20 NOTE — ED Notes (Signed)
Patient transported to CT 

## 2021-11-20 NOTE — Consult Note (Addendum)
Patient ID: Sergio Chavez MRN: [id number] DOB/AGE: 81-Nov-1942 81 y.o.  Admit date: 11/11/2021  Admission Diagnoses:  Principal Problem:   Femoral neck fracture [facility]) Active Problems:   Closed right hip fracture (HCC)   HPI: The patient is currently admitted to the IM team at [facility]. Ortho was consulted for right femoral neck fracture sustained earlier on Sunday 2/12 morning when the patient had a ground level fall. He denies loss of consciousness. He had pain in his bilateral ankles but ED radiographs were negative. He has been household ambulator with assistive device (walker in AM, cane in PM). He lives with his elderly wife. History is provided by daughter Ms. Kipp Brood at bedside. His PMH is notable for HTN, smoking, throat cancer. He had contralateral left hip fracture for which he underwent hemiarthroplasty in Feb 2022. Since admission, notable events include near respiratory arrest (reversed with Narcan) as well as severe hypertension (up to 230/130+ mm Hg). He has also been mostly somnolent. According to his daughter, the patient has not been able to function independently since his prior left hip fracture.   Past Medical History: Past Medical History:  Diagnosis Date   Hypertension    Throat cancer [facility])     Surgical History: Past Surgical History:  Procedure Laterality Date   BACK SURGERY     HIP ARTHROPLASTY Left 12/04/2020   Procedure: HIP HEMIARTHROPLASTY;  Surgeon: Altamese Rowley, MD;  Location: [place];  Service: Orthopedics;  Laterality: Left;   PROSTATE SURGERY      Family History: Family History  Problem Relation Age of Onset   Diabetes Mellitus II Father     Social History: Social History   Socioeconomic History   Marital status: Married    Spouse name: Not on file   Number of children: Not on file   Years of education: Not on file   Highest education level: Not on file  Occupational History   Not on file  Tobacco Use   Smoking status:  Every Day    Packs/day: 0.50    Types: Cigarettes   Smokeless tobacco: Never  Vaping Use   Vaping Use: Never used  Substance and Sexual Activity   Alcohol use: No   Drug use: Never   Sexual activity: Not on file  Other Topics Concern   Not on file  Social History Narrative   Not on file   Social Determinants of Health   Financial Resource Strain: Not on file  Food Insecurity: Not on file  Transportation Needs: Not on file  Physical Activity: Not on file  Stress: Not on file  Social Connections: Not on file  Intimate Partner Violence: Not on file    Allergies: Patient has no known allergies.  Medications: I have reviewed the patient's current medications.  Vital Signs: Patient Vitals for the past 24 hrs:  BP Temp Temp src Pulse Resp SpO2 Height Weight  11/24/2021 2146 139/79 -- -- -- -- 94 % -- --  11/11/2021 2127 (!) 163/105 -- -- -- -- 90 % -- --  11/10/2021 2125 (!) 162/97 -- -- -- -- 91 % -- --  11/29/2021 2115 (!) 181/109 -- -- -- -- -- -- --  11/19/2021 2109 (!) 197/119 -- -- (!) 104 -- 96 % -- --  12/06/2021 2101 (!) 206/114 -- -- -- -- -- -- --  11/18/2021 2052 (!) 239/123 -- -- (!) 104 -- -- -- --  11/24/2021 2050 (!) 243/138 -- -- (!) 102 -- -- -- --  11/22/2021 2047 (!) 185/170 -- -- -- -- -- -- --  11/19/2021 2046 (!) 185/170 -- -- (!) 109 -- -- -- --  12/01/2021 2041 (!) 183/104 97.6 F (36.4 C) Axillary -- -- 100 % -- --  11/24/2021 2039 (!) 183/104 97.6 F (36.4 C) Oral 100 15 100 % -- --  11/16/2021 2025 (!) 167/97 97.6 F (36.4 C) Oral (!) 104 15 (!) 75 % -- --  11/16/2021 2025 (!) 167/97 (!) 96.6 F (35.9 C) Axillary (!) 104 -- (!) 77 % -- --  11/22/2021 1930 (!) 152/97 -- -- 100 12 95 % -- --  11/26/2021 1915 134/87 -- -- 98 14 95 % -- --  11/22/2021 1845 132/86 -- -- (!) 101 (!) 21 98 % -- --  11/27/2021 1805 (!) 206/113 -- -- (!) 105 13 99 % -- --  11/12/2021 1715 (!) 151/98 -- -- 78 14 98 % -- --  11/16/2021 1700 (!) 145/92 -- -- 82 13 90 % -- --  12/03/2021 1645 (!) 175/89 --  -- 84 12 93 % -- --  12/06/2021 1615 (!) 175/91 -- -- 75 14 97 % -- --  11/22/2021 1545 (!) 152/98 -- -- 68 15 99 % -- --  11/10/2021 1530 (!) 189/96 -- -- 70 15 98 % -- --  11/14/2021 1515 (!) 150/90 -- -- 70 18 97 % -- --  11/26/2021 1502 -- -- -- 61 18 90 % -- --  11/22/2021 1500 (!) 156/82 -- -- 65 20 (!) 89 % -- --  12/02/2021 1448 -- -- -- 63 17 90 % -- --  11/16/2021 1445 139/76 -- -- 65 13 -- -- --  11/09/2021 1432 (!) 180/99 -- -- (!) 58 16 95 % -- --  11/14/2021 1345 (!) 158/88 -- -- 65 (!) 22 95 % -- --  11/27/2021 1325 -- -- -- -- -- -- 5\' 8"  (1.727 m) 54.9 kg  11/13/2021 1315 (!) 187/97 97.6 F (36.4 C) Oral 70 14 91 % -- --    Radiology: DG Chest 1 View  Result Date: 12/05/2021 CLINICAL DATA:  Low O2 sats. EXAM: CHEST  1 VIEW COMPARISON:  07/01/2021 FINDINGS: 1601 hours. Lungs are hyperexpanded. Interstitial markings are diffusely coarsened with chronic features. The cardio pericardial silhouette is enlarged. Bones are diffusely demineralized. Telemetry leads overlie the chest. IMPRESSION: Chronic interstitial coarsening. No acute cardiopulmonary findings. Electronically Signed   By: Misty Stanley M.D.   On: 11/12/2021 16:13   DG Ankle Complete Left  Result Date: 11/16/2021 CLINICAL DATA:  Unwitnessed fall EXAM: LEFT ANKLE COMPLETE - 3+ VIEW; RIGHT ANKLE - COMPLETE 3+ VIEW COMPARISON:  None. FINDINGS: There is no evidence of fracture, dislocation, or joint effusion. There is no evidence of arthropathy or other focal bone abnormality. Soft tissues are unremarkable. IMPRESSION: No fracture or dislocation of the bilateral ankles. Electronically Signed   By: Delanna Ahmadi M.D.   On: 11/22/2021 14:49   DG Ankle Complete Right  Result Date: 12/02/2021 CLINICAL DATA:  Unwitnessed fall EXAM: LEFT ANKLE COMPLETE - 3+ VIEW; RIGHT ANKLE - COMPLETE 3+ VIEW COMPARISON:  None. FINDINGS: There is no evidence of fracture, dislocation, or joint effusion. There is no evidence of arthropathy or other focal bone  abnormality. Soft tissues are unremarkable. IMPRESSION: No fracture or dislocation of the bilateral ankles. Electronically Signed   By: Delanna Ahmadi M.D.   On: 11/13/2021 14:49   CT Hip Right Wo Contrast  Addendum Date: 11/26/2021   ADDENDUM REPORT: 12/01/2021  20:03 ADDENDUM: There is an error within the Findings and Impression sections of the original report. The described femoral neck fracture is subcapital in location, not basicervical. This change was discussed with Dr. Kathaleen Bury at the time of addendum. Electronically Signed   By: Davina Poke D.O.   On: 11/12/2021 20:03   Result Date: 12/03/2021 CLINICAL DATA:  Unwitnessed fall.  Hip pain.  Abnormal x-ray EXAM: CT OF THE RIGHT HIP WITHOUT CONTRAST TECHNIQUE: Multidetector CT imaging of the right hip was performed according to the standard protocol. Multiplanar CT image reconstructions were also generated. RADIATION DOSE REDUCTION: This exam was performed according to the departmental dose-optimization program which includes automated exposure control, adjustment of the mA and/or kV according to patient size and/or use of iterative reconstruction technique. COMPARISON:  Hip x-ray 11/11/2021.  CT abdomen pelvis 12/23/2004 FINDINGS: Bones/Joint/Cartilage Acute minimally impacted basicervical fracture of the lateral aspect of the right femoral neck (series 6, images 40-47). No evidence of disruption of the medial cortex at this time. Hip joint intact without dislocation. 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 Visualized bony pelvis intact without fracture or diastasis. No lytic or sclerotic bone lesion identified. Ligaments Suboptimally assessed by CT. Muscles and Tendons No acute musculotendinous abnormality by CT. Soft tissues No fluid collection or hematoma about the hip. Infrarenal abdominal aortic aneurysm is partially imaged measuring 7.5 x 7.4 cm in transaxial dimension (series 3, image 1). There is peripheral  atherosclerotic calcification of the aneurysm sac. Heavily calcified iliac arteries. Colonic diverticulosis. IMPRESSION: 1. Acute minimally impacted basicervical fracture of the lateral aspect of the right femoral neck. 2. Infrarenal abdominal aortic aneurysm measuring up to 7.5 cm in diameter. Recommend referral to a vascular specialist. This recommendation follows ACR consensus guidelines: White Paper of the ACR Incidental Findings Committee II on Vascular Findings. J Am Coll Radiol 2013; 10:789-794. Electronically Signed: By: Davina Poke D.O. On: 11/15/2021 16:24   DG Hip Unilat With Pelvis 2-3 Views Right  Result Date: 11/16/2021 CLINICAL DATA:  Fall, hip pain EXAM: DG HIP (WITH OR WITHOUT PELVIS) 2-3V RIGHT COMPARISON:  12/04/2020 FINDINGS: Diffuse bony demineralization. Findings suspicious for a minimally impacted right femoral neck fracture, best seen on frog-lateral view. Pelvic bony ring appears intact without fracture or diastasis. Prior left hip hemiarthroplasty. Atherosclerotic calcifications are present. IMPRESSION: Findings suspicious for a minimally impacted right femoral neck fracture. Consider CT or MRI for confirmation if indicated. Electronically Signed   By: Davina Poke D.O.   On: 12/06/2021 14:48    Labs: Recent Labs    11/09/2021 1341  WBC 9.6  RBC 4.64  HCT 41.1  PLT 224   Recent Labs    11/27/2021 1341  NA 136  K 3.6  CL 101  CO2 25  BUN 12  CREATININE 1.04  GLUCOSE 90  CALCIUM 9.2   Recent Labs    11/16/2021 1341  INR 1.1    Review of Systems: ROS as detailed in HPI  Physical Exam: Body mass index is 18.4 kg/m.  Gen: Pt deeply somnolent, not readily arousable, not following commands  Right lower extremity: Log roll deferred to avoid fx displacement Skin intact Unable to test sensation/motor DP, PT 2+ CR<2s   Assessment and Plan: The patient is an 81 year male household ambulator who is admitted for right femoral neck fracture sustained  on 11/24/2021 with PMH notable for large aortic abdominal aneurysm  -history, imaging and plan reviewed at length with daughter who is in agreement -he has  an impacted subcapital incomplete fracture of the right femoral neck (directly reviewed this with interpreting radiologist) -plan for fixation of the right femoral neck fracture with cannulated screws -discussed with OR to determine room availability, plan is for surgery Monday 2/13 at noon to ensure Hana table available -please keep NPO, hold blood thinners from midnight tonight  -reviewed his comorbidities with anesthesiologist overnight: the patient will need Vascular Surgery evaluation for preoperative clearance given his massive abdominal aortic aneurysm and sustained hypertension during this admission -important: please minimize movement of patient to prevent femoral neck fracture displacement (nursing instruction and strict bedrest order placed - ok for urinal or foley, no bedside commode) -appreciate primary team care  Armond Hang, MD Orthopaedic Surgeon [facility] [phone]

## 2021-11-20 NOTE — Care Plan (Addendum)
ORTHOPAEDIC SURGERY Plan of Care  -paged on call by ED team about this patient -pt has impacted right femoral neck fracture following ground level fall today -PMH notable for HTN, smoking, throat CA. He is not on anticoagulation and is ambulatory per report -he had contralateral left hip fx and underwent hemiarthroplasty Feb 2022 -he is being admitted to Medicine from ED -plan for fixation of the right femoral neck fracture -discussed with OR to determine room availability, plan is for surgery Monday 2/13 at noon -please keep NPO, hold blood thinners from midnight tonight  -important: please minimize movement of patient to prevent femoral neck fracture displacement (nursing instruction and strict bedrest order placed - ok for urinal or foley, no bedside commode) -full consult note to follow  Armond Hang, MD Orthopaedic Surgery [facility]

## 2021-11-20 NOTE — Anesthesia Preprocedure Evaluation (Deleted)
Anesthesia Evaluation  Patient identified by MRN, date of birth, ID band Patient awake    Reviewed: Allergy & Precautions, H&P , NPO status , Patient's Chart, lab work & pertinent test results, reviewed documented beta blocker date and time   Airway Mallampati: II  TM Distance: >3 FB Neck ROM: full    Dental no notable dental hx.    Pulmonary neg pulmonary ROS, Current Smoker,    Pulmonary exam normal breath sounds clear to auscultation       Cardiovascular Exercise Tolerance: Good hypertension, Pt. on medications  Rhythm:regular Rate:Normal     Neuro/Psych negative neurological ROS  negative psych ROS   GI/Hepatic negative GI ROS, Neg liver ROS,   Endo/Other  negative endocrine ROS  Renal/GU negative Renal ROS  negative genitourinary   Musculoskeletal   Abdominal   Peds  Hematology negative hematology ROS (+)   Anesthesia Other Findings   Reproductive/Obstetrics negative OB ROS                           Anesthesia Physical Anesthesia Plan  ASA: 4  Anesthesia Plan: General   Post-op Pain Management:    Induction: Intravenous  PONV Risk Score and Plan: 2 and Ondansetron, Dexamethasone and Treatment may vary due to age or medical condition  Airway Management Planned: Oral ETT  Additional Equipment: None  Intra-op Plan:   Post-operative Plan: Extubation in OR  Informed Consent: I have reviewed the patients History and Physical, chart, labs and discussed the procedure including the risks, benefits and alternatives for the proposed anesthesia with the patient or authorized representative who has indicated his/her understanding and acceptance.     Dental Advisory Given  Plan Discussed with: CRNA and Anesthesiologist  Anesthesia Plan Comments: Sergio Chavez is a 81 y.o. male with a pertinent PMH of HTN, prior left hip fracture and throat cancer presenting to the ED for  evaluation after a fall. He complained of right hip pain on arrival to the ED. He stated that it was difficult to stand and that he could not bear walking due to pain.   Patient is a regular smoker and does not wear O2 at home. He was initially placed on 4L [place] satting at 100%; turned down to 2L [place], satting between 88-93%.   XR suspicious for right femoral neck fracture. Not on any anticoagulation. CT of right hip showing acute right femoral neck fracture.  HTN  BP elevated 170/90's in setting of pain and not taking his BP medications this morning.  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 Smoking cessation has been encouraged in the past. Pt refuses to use nicotine patches per family. He is not interested in quitting at this time.  - Nicotine patches   Code status  Family reports that pt does not have a DNR. He is currently sedated. Will re-assess his GOC when able to converse. )       Anesthesia Quick Evaluation

## 2021-11-20 NOTE — Hospital Course (Addendum)
Comfort care  Unfortunately patient had further clinical decline over the next couple of days after his surgical hip pinning, with respiratory failure found to have multiple bilateral pulmonary embolisms and pleural effusions. He was transitioned to full comfort care on 2/19. He is surrounded by family. He is agonally breathing and appears to be in the active stages of dying on 2/20. We transitioned him to a continuous morphine infusion for better symptom control. Anticipated in hospital death within hours to days. Hold off on inpatient hospice transfer for now, qwith plans to readdress if status clinically changes. .   - Palliative following, greatly appreciate assistance with complicated case  - TOC consulted for residential hospice placement, asked to hold off given anticipated in hospital death  - Spiritual care following  - Haldol prn  - Zofran and Reglan prn  - Lorazepam 1mg  q4h prn  - Morphine IV 2mg  q30 mins prn, lidocaine patches, tylenol prn  - Morphine infusion  - Dulcolax suppository and Senna  - No labs   See below for by problem detail leading up to transition to full comfort care:   Acute right femoral neck fracture  Hx of left hemiarthroplasty 11/2020  Vitamin D insuffiencey   Severe compression fracture of the T6 vertebral body Surgical pinning previously postponed until after AAA endarterectomy. Then later favoring palliative care with no plan for vascular surgery. This delayed pinning for 5 days due to constant changes in plan. Pt was held off on VTE ppx due to anticipated surgery. 50,000 U Vit ordered.  Successful surgical pinning on 2/19. Per ortho WBAT and patient can sit up in his bed. Pain medications on board, and adequately controlled. Bowel regimen in place. PT/OT following.   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 Vascular consulted. Not candidate for open surgery d/t comorbidities. Options  include infrarenal AAA repair with bilateral femoral endartectomies (possible Wednesday) vs transfer to [facility] vs palliative care. Plan bilateral femoral endartectomies, and then discontinued given plan to move forward with palliative instead. This delayed ortho pinning and started VTE ppx.  - Palliative care following, appreciate assistance    Increased O2 requirement Patient had increased O2 requirement from 3L to 5L s/p surgical pinning. He underwent a CTA chest and was noted to have PE seen within anterior right upper lobe and posteromedial lower lobe branches of the right pulmonary artery. He was also noted to have small to moderate sized partially loculated pleural effusions. Eliquis started. Antibiotics were held due to patient's normal WBC and negative procalcitonin. He then returned to 3L the next day. His WBC count remains normal and Hgb is stable. Shortly after, he was transitioned to full comfort care before any further workup could be started for effusions. Eliquis discontinued.    Severe chronic asymptomatic hypertension Difficult to control HTN this admission requiring: - amlodipine 10 mg daily  - Hydralazine 10 mg TID  - Lisinopril 20 mg    Hospital delirium   Ongoing. Pulling at lines and tubes in the evening despite trazodone, but pt is redirectable with the help of family. - Seroquel 25 mg at bedtime at 1900   AKI, resolved  Hyponatremia   Resolved with IVF   Possible Upper GI bleed vs gastritis  Possible coffee ground emesis with new AKI and elevated BUN. GI consulted; offered EGD workup refused. Hgb stable.  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 Smoking cessation has been  encouraged in the past, but not interested in quitting.  1 cm incidental irregular nodule concerning for neoplasm found on CTA this admission.  - Nicotine patches  - Palliative care following, appreciate assistance    Incidental CTA findings  Numerous hepatic cysts, and additional too small to characterize hypodensities in the left kidney. - plan for outpatient follow up prior to transition to comfort

## 2021-11-20 NOTE — ED Notes (Signed)
Patient transported to X-ray 

## 2021-11-21 ENCOUNTER — Inpatient Hospital Stay (HOSPITAL_COMMUNITY): Payer: Medicare Other

## 2021-11-21 ENCOUNTER — Encounter: Payer: Self-pay | Admitting: Vascular Surgery

## 2021-11-21 ENCOUNTER — Encounter (HOSPITAL_COMMUNITY): Admission: EM | Disposition: E | Payer: Self-pay | Source: Home / Self Care | Attending: Internal Medicine

## 2021-11-21 DIAGNOSIS — R911 Solitary pulmonary nodule: Secondary | ICD-10-CM

## 2021-11-21 DIAGNOSIS — S72001A Fracture of unspecified part of neck of right femur, initial encounter for closed fracture: Secondary | ICD-10-CM

## 2021-11-21 DIAGNOSIS — Z96642 Presence of left artificial hip joint: Secondary | ICD-10-CM

## 2021-11-21 DIAGNOSIS — E785 Hyperlipidemia, unspecified: Secondary | ICD-10-CM

## 2021-11-21 DIAGNOSIS — I716 Thoracoabdominal aortic aneurysm, without rupture, unspecified: Secondary | ICD-10-CM | POA: Diagnosis not present

## 2021-11-21 DIAGNOSIS — E559 Vitamin D deficiency, unspecified: Secondary | ICD-10-CM | POA: Diagnosis not present

## 2021-11-21 DIAGNOSIS — I10 Essential (primary) hypertension: Secondary | ICD-10-CM

## 2021-11-21 DIAGNOSIS — F17210 Nicotine dependence, cigarettes, uncomplicated: Secondary | ICD-10-CM

## 2021-11-21 DIAGNOSIS — C329 Malignant neoplasm of larynx, unspecified: Secondary | ICD-10-CM

## 2021-11-21 LAB — CBC WITH DIFFERENTIAL/PLATELET
Abs Immature Granulocytes: 0.05 10*3/uL (ref 0.00–0.07)
Basophils Absolute: 0.1 10*3/uL (ref 0.0–0.1)
Basophils Relative: 1 %
Eosinophils Absolute: 0 10*3/uL (ref 0.0–0.5)
Eosinophils Relative: 0 %
HCT: 40.8 % (ref 39.0–52.0)
Hemoglobin: 13.5 g/dL (ref 13.0–17.0)
Immature Granulocytes: 0 %
Lymphocytes Relative: 4 %
Lymphs Abs: 0.5 10*3/uL — ABNORMAL LOW (ref 0.7–4.0)
MCH: 29.4 pg (ref 26.0–34.0)
MCHC: 33.1 g/dL (ref 30.0–36.0)
MCV: 88.9 fL (ref 80.0–100.0)
Monocytes Absolute: 0.9 10*3/uL (ref 0.1–1.0)
Monocytes Relative: 8 %
Neutro Abs: 10.7 10*3/uL — ABNORMAL HIGH (ref 1.7–7.7)
Neutrophils Relative %: 87 %
Platelets: 228 10*3/uL (ref 150–400)
RBC: 4.59 MIL/uL (ref 4.22–5.81)
RDW: 14.4 % (ref 11.5–15.5)
WBC: 12.3 10*3/uL — ABNORMAL HIGH (ref 4.0–10.5)
nRBC: 0 % (ref 0.0–0.2)

## 2021-11-21 LAB — BASIC METABOLIC PANEL
Anion gap: 13 (ref 5–15)
BUN: 21 mg/dL (ref 8–23)
CO2: 25 mmol/L (ref 22–32)
Calcium: 9.7 mg/dL (ref 8.9–10.3)
Chloride: 95 mmol/L — ABNORMAL LOW (ref 98–111)
Creatinine, Ser: 1.5 mg/dL — ABNORMAL HIGH (ref 0.61–1.24)
GFR, Estimated: 47 mL/min — ABNORMAL LOW (ref >60)
Glucose, Bld: 133 mg/dL — ABNORMAL HIGH (ref 70–99)
Potassium: 3.9 mmol/L (ref 3.5–5.1)
Sodium: 133 mmol/L — ABNORMAL LOW (ref 135–145)

## 2021-11-21 LAB — VITAMIN D 25 HYDROXY (VIT D DEFICIENCY, FRACTURES): Vit D, 25-Hydroxy: 10.44 ng/mL — ABNORMAL LOW (ref 30–100)

## 2021-11-21 SURGERY — FIXATION, FEMUR, NECK, PERCUTANEOUS, USING SCREW
Anesthesia: General | Site: Hip | Laterality: Right

## 2021-11-21 MED ORDER — KETOROLAC TROMETHAMINE 15 MG/ML IJ SOLN
7.5000 mg | Freq: Four times a day (QID) | INTRAMUSCULAR | Status: DC
Start: 2021-11-21 — End: 2021-11-22
  Administered 2021-11-21 – 2021-11-22 (×3): 7.5 mg via INTRAVENOUS
  Filled 2021-11-21 (×3): qty 1

## 2021-11-21 MED ORDER — IOHEXOL 350 MG/ML SOLN
75.0000 mL | Freq: Once | INTRAVENOUS | Status: AC | PRN
  Administered 2021-11-21: 75 mL via INTRAVENOUS

## 2021-11-21 MED ORDER — PANTOPRAZOLE 80MG IVPB - SIMPLE MED
80.0000 mg | Freq: Once | INTRAVENOUS | Status: AC
  Administered 2021-11-21: 80 mg via INTRAVENOUS
  Filled 2021-11-21: qty 100

## 2021-11-21 MED ORDER — ONDANSETRON HCL 4 MG/2ML IJ SOLN
4.0000 mg | Freq: Four times a day (QID) | INTRAMUSCULAR | Status: DC | PRN
  Administered 2021-11-22: 4 mg via INTRAVENOUS
  Filled 2021-11-21: qty 2

## 2021-11-21 MED ORDER — ONDANSETRON HCL 4 MG/2ML IJ SOLN
4.0000 mg | Freq: Once | INTRAMUSCULAR | Status: AC
  Administered 2021-11-21: 4 mg via INTRAVENOUS
  Filled 2021-11-21: qty 2

## 2021-11-21 MED ORDER — LACTATED RINGERS IV SOLN: INTRAVENOUS | Status: AC

## 2021-11-21 MED ORDER — MORPHINE SULFATE (PF) 2 MG/ML IV SOLN
2.0000 mg | INTRAVENOUS | Status: DC | PRN
  Administered 2021-11-21 – 2021-11-22 (×2): 2 mg via INTRAVENOUS
  Filled 2021-11-21 (×2): qty 1

## 2021-11-21 MED ORDER — IOHEXOL 350 MG/ML SOLN: 100.0000 mL | Freq: Once | INTRAVENOUS | Status: DC | PRN

## 2021-11-21 MED ORDER — MORPHINE SULFATE (PF) 2 MG/ML IV SOLN: 2.0000 mg | INTRAVENOUS | Status: DC | PRN

## 2021-11-21 NOTE — TOC CAGE-AID Note (Signed)
Transition of Care [facility]) - CAGE-AID Screening   Patient Details  Name: Sergio Chavez MRN: [id number] Date of Birth: 1941-07-29  Transition of Care [facility]) CM/SW Contact:    Bruce Mayers C Tarpley-Carter, [place] Phone Number: 11/15/2021, 12:21 PM   Clinical Narrative: Pt is unable to participate in Cage Aid. Pt was experiencing respiratory distress.  CSW will assess at a better time.  Raiya Stainback Tarpley-Carter, MSW, LCSW-A Pronouns:  She/Her/[place] Transitions of Care Clinical Social Worker Direct Number:  [phone] [email or web address]  CAGE-AID Screening: Substance Abuse Screening unable to be completed due to: : Patient unable to participate (Pt was experiencing respiratory distress.)             Substance Abuse Education Offered: No

## 2021-11-21 NOTE — Progress Notes (Signed)
°   11/24/2021 2046  Assess: MEWS Score  Temp 97.6 F (36.4 C)  BP (!) 185/170  Pulse Rate (!) 109  Resp (!) 24  SpO2 100 %  O2 Device Nasal Cannula;Non-rebreather Mask (15 L [place] and NRB)  Assess: MEWS Score  MEWS Temp 0  MEWS Systolic 0  MEWS Pulse 1  MEWS RR 1  MEWS LOC 0  MEWS Score 2  MEWS Score Color Yellow  Assess: if the MEWS score is Yellow or Red  Were vital signs taken at a resting state? Yes  Focused Assessment Change from prior assessment (see assessment flowsheet)  MEWS guidelines implemented *See Row Information* Yes  Treat  Pain Scale Faces  Pain Score Asleep (Hard to arouse)  Take Vital Signs  Increase Vital Sign Frequency  Red: Q 1hr X 4 then Q 4hr X 4, if remains red, continue Q 4hrs  Escalate  MEWS: Escalate Red: discuss with charge nurse/RN and provider, consider discussing with RRT  Notify: Charge Nurse/RN  Name of Charge Nurse/RN Notified Josphine, RN  Date Charge Nurse/RN Notified 11/26/2021  Time Charge Nurse/RN Notified 2046  Notify: Provider  Provider Name/Title Iona Beard, MD  Date Provider Notified 11/24/2021  Time Provider Notified 2046  Notification Type Face-to-face  Notification Reason Change in status  Provider response At bedside  Date of Provider Response 12/02/2021  Time of Provider Response 2046  Notify: Rapid Response  Name of Rapid Response RN Notified Code team at the bedside  Date Rapid Response Notified 12/05/2021  Time Rapid Response Notified 2025  Document  Patient Outcome Stabilized after interventions

## 2021-11-21 NOTE — Progress Notes (Signed)
OT Cancellation Note  Patient Details Name: Sergio Chavez MRN: [id number] DOB: [birth year]   Cancelled Treatment:    Reason Eval/Treat Not Completed: Medical issues which prohibited therapy (Pt with active bedrest order, awaiting vascular and orthopedic surgery. Will evaluate when appropriate.)  Lynnda Child, OTD, OTR/L Acute Rehab [phone]) [phone] - [place] 12/06/2021, 1:15 PM

## 2021-11-21 NOTE — Progress Notes (Signed)
PT Cancellation Note  Patient Details Name: Sergio Chavez MRN: [id number] DOB: 1941/02/03   Cancelled Treatment:    Reason Eval/Treat Not Completed: Patient not medically ready. PT eval received, chart reviewed. Noted pt currently on bed rest and plan for vascular surgery prior to addressing femoral fracture. Will continue to follow and initiate PT evaluation when appropriate.    Thelma Comp 11/30/2021, 8:38 AM  Rolinda Roan, PT, DPT Acute Rehabilitation Services Pager: [phone] Office: [phone]

## 2021-11-21 NOTE — Progress Notes (Signed)
°  Date: 11/24/2021  Patient name: Sergio Chavez  Medical record number: [id number]  Date of birth: 1941-02-28   I have seen and evaluated Karen Kays and discussed their care with the Residency Team. In brief, patient is an 81 y/o male with PMH of HTN, prior left hip fx, throat cancer who p/w right pain after a fall.  Patient lost balance while using his walker and fell to the ground. He had immediate Right hip pain and inabiloty to bear weight. No CP. No SOB, no abd pain, no lightheadedness, no syncope, no n/v, no diarrhea, no fevers/chills, no HA. No blurry vision, no slurred speech.  Today, patient still appears confused at bedside complaining of b/l pain in his sides.  PMHx, Fam Hx, and/or Soc Hx : as per resident admit note  Vitals:   11/18/2021 0756 11/13/2021 1252  BP: 136/73 100/72  Pulse: 68 70  Resp: 18 18  Temp: 98.1 F (36.7 C) 98 F (36.7 C)  SpO2: 93% 93%   Gen: Awake, alert, confused, In moderate pain CVS: RRR, normal heart sounds Lungs: CTA b/l Abd: soft, non tender, non distended, normoactive bowel sounds Ext: no edema noted Neuro: confused but able to follow commands HEENT: normocephalic, atraumatic Skin: bruising noted along left arm and right hand  Assessment and Plan: I have seen and evaluated the patient as outlined above. I agree with the formulated Assessment and Plan as detailed in the residents' note, with the following changes:   1. AAA: - Patient noted to have a large 7.7 cm x 7.3 cm mostly thrombosed infrarenal AAA noted on CTA extending 8.2 cms. Patient is asymptomatic from this currently but is at risk for rupture. - Vascular surgery follow up and recommendations appreciated.  - They have discussed multiple options with the family including palliative care, transfer to [facility] for evaluation by Dr. Sammuel Hines or infrarenal AAA repair with b/l femoral endarterectomies. Not a candidate for open surgery secondary to co-morbidities and frailty. - Patient is  tentatively scheduled for OR on Wednesday pending family decision on his options - Will f/u palliative care recommendations - Will continue with blood pressure control with amlodipine and lisinopril  2. Acute right femoral neck fracture: - Patient presented with R hip pain s/p fall. CT showing acute R femoral neck fracture.  - Initially was scheduled for pinning by ortho but this has been postponed pending AAA repair given risk of rupture during surgery - Will c/w pain control for now - Ortho recommends anesthesia consultation for potential nerve block for pain control  Aldine Contes, MD 2/13/20232:05 PM

## 2021-11-21 NOTE — Progress Notes (Addendum)
[facility] [place]  Subjective  - Appears comfortable pain medication helps with hip pain.  No complaints of abdominal or lumbar pain.  Assessment/Planning: Asymptomatic AAA infrarenal aorta now measures up to 7.7 x 7.3 cm  Found incidentally during hip work up after right femoral neck fracture.  DR. Stanford Breed has discussed the findings with orthopedic surgery.  The plan will be to address the AAA prior to ORIF of the right hip.  Plan will be for AAA repair Wednesday 11/17/2021 verses transfer to [facility] for intervention.   From a vascular point of view he may eat today.  CTA  IMPRESSION: 1. Extensive aortic and branch vessel atherosclerosis. Additional coronary artery atherosclerosis. 2. 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 Consultation with a vascular surgeon is recommended. 3. Inflow and outflow stenoses as detailed above. Of note the right external iliac artery is occluded as is the left internal iliac artery. A stenotic right common femoral artery is reconstituted apparently via collateral flow from the deep circumflex iliac and right inferior epigastric arteries. 4. 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 5. 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 6. Cardiomegaly with slight right chamber predominance, a prominent pulmonary trunk, and small questionable filling defects may be present in some of the subsegmental right lower lobe arteries but this is difficult to confirm due to the degree of respiratory motion. If  these do represent arterial emboli the clot burden is small but the prominent pulmonary trunk and expanded right chambers suggest right heart strain, and there is distention of the central pulmonary veins without edema. 7. Minimal pleural effusions with posterior basal lower lobe opacities which could be atelectasis or consolidation. 8. Irregular left upper lobe nodule approaching 1 cm concerning for neoplasm. Consider PET-CT or tissue sampling. No adenopathy is seen. 9. Numerous hepatic cysts, additional too small to characterize hypodensities in the left kidney. 10. Ascending colitis versus nondistention or congestive wall thickening. Prominent gastroenteritis as well. Constipation including can not can not with impacted feces in the rectum. 11. Severe compression fracture of the T6 vertebral body, which appears to be recent with paraspinal soft tissue fullness consistent with hematoma and slight nonstenosing posteroinferior cortical retropulsion. 12. Acute subcapital proximal right femoral fracture. 13. Prostatomegaly with TURP defect. 14. 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 15. Additional findings discussed above.       Objective 136/73 68 98.1 F (36.7 C) (Oral) 18 93% No intake or output data in the 24 hours ending 11/19/2021 0757  General no acute distress NTTP abdomin. Palpable AAA Right LE pain to movement secondary to hip fracture Lungs non labored breathing    Roxy Horseman 12/04/2021 7:57 AM --  Laboratory Lab Results: Recent Labs    11/17/2021 1341  WBC 9.6  HGB 13.2  HCT 41.1  PLT 224   BMET Recent Labs    12/01/2021 1341  NA 136  K 3.6  CL 101  CO2 25  GLUCOSE 90  BUN 12  CREATININE 1.04  CALCIUM 9.2    COAG Lab Results  Component Value Date  INR 1.1 11/10/2021   INR 1.0 12/03/2020   No results found for: PTT  VASCULAR STAFF ADDENDUM: I have independently interviewed and examined the patient. I  agree with the above.   Extremely complex situation.  CT angiogram reviewed in detail personally and with my partners. Highly complex extent II thoracoabdominal aortic aneurysm, with large infrarenal segment measuring 75 mm.  Severe iliac occlusive disease.  Occluded right external iliac artery.  Heavily diseased left common iliac artery.  Heavily diseased bilateral common femoral arteries.  Open surgery to correct the aneurysm is not an option given the patient's frailty, COPD, advanced age.  Endovascular repair would be highly challenging given access issues, limited infrarenal abdominal aortic aneurysm neck, need to maintain adequate perfusion to the right hemipelvis for healing purposes.  The patient is not able to make decisions for himself.  I offered the patient's daughter several options going forward:  1) palliative care, which could include orthopedic surgery, but would not involve aortic reconstruction.  We would allow the aorta to take its natural course.  The patient would likely expire in the next year or two from rupture.  2) transfer to [facility] for evaluation by Dr. Sammuel Hines.  Given the manufacturer's inability to create a fenestrated graft in the short timeframe needed for repair, [facility] may not have any more options for the patient than we do here.  3) infrarenal abdominal aortic aneurysm repair with bilateral femoral endarterectomies, attempt at recanalization of the right external iliac artery and aortobiiliac endografting.  If recanalization fails, we could do a aorto uniiliac endograft with femoral-femoral bypass.  The infrarenal seal zone is marginal.  Endo anchors could be used to augment the proximal seal zone. I think this is the least complex way to get the large aneurysm treated and lower his risk of rupture.  Understandably, the daughter would like to think about her options.  I do not think repair is emergent. I do not think the aneurysm is symptomatic.  I will continue to  follow this situation very closely.  Please call for any questions.  Yevonne Aline. Stanford Breed, MD [facility] Phone Number: [phone] 11/29/2021 9:27 AM

## 2021-11-21 NOTE — Progress Notes (Addendum)
Subjective:  Patient is far less somnolent today. However, he is disoriented and agitated. He is accompanied at bedside by his two daughters. He has been evaluated by Vascular Surgery overnight and again today morning. He has continued to have multiple episodes of sustained elevated hypertension.  Objective:   VITALS:  Temp:  [97.8 F (36.6 C)-99.1 F (37.3 C)] 98.3 F (36.8 C) (02/13 2000) Pulse Rate:  [68-101] 97 (02/13 2000) Resp:  [16-22] 20 (02/13 2000) BP: (100-188)/(68-109) 168/91 (02/13 2000) SpO2:  [90 %-99 %] 93 % (02/13 2000) FiO2 (%):  [36 %] 36 % (02/13 1751)  Gen: Pt alert, not oriented to time/place, agitated, not following commands   Right lower extremity: Log roll deferred to avoid fx displacement Skin intact Unable to test sensation/motor DP, PT 2+ CR<2s   LABS Recent Labs    11/12/2021 1341 11/18/2021 1818  HGB 13.2 13.5  WBC 9.6 12.3*  PLT 224 228   Recent Labs    11/26/2021 1341 12/01/2021 1818  NA 136 133*  K 3.6 3.9  CL 101 95*  CO2 25 25  BUN 12 21  CREATININE 1.04 1.50*  GLUCOSE 90 133*   Recent Labs    11/22/2021 1341  INR 1.1     Assessment/Plan: The patient is an 81 year male household ambulator who is admitted for right femoral neck fracture sustained on 12/04/2021 with PMH notable for large aortic abdominal aneurysm  -pt workup found to have massive aortic aneurysm that is potentially life threatening if it were to rupture -reviewed situation with Vascular Surgery colleagues who plan complex intervention on Wednesday and have discussed other options with family -as discussed extensively with patient's daughter Ms. Marshell Levan, although we could proceed with urgent fixation of right hip, in reality there is serious concern proceeding with orthopaedic surgery prior to aneurysm repair in setting of severe hypertension since admission. Rupture of this aneurysm during hip fixation would likely be immediately fatal intraop and extremely difficult to  salvage -the patient is minimally ambulatory and has an impacted nondisplaced likely incomplete subcapital right femoral neck fracture in stable alignment on multiple imaging studies since admission -as discussed with our Vascular colleagues, we will prioritize aneurysm repair prior to considering fixing or replacing the right hip -will coordinate with Vascular and Ortho Trauma colleagues on safest timing for hip fixation (if medically possible) in this unique situation -discussed all of this with patient's daughter Nicoles Sedlacek as well as her sister both of whom have been helping with medical decision making since admission. She understands and is in agreement with this plan -would also recommend anesthesiology consultation for pain control with potential block as well as goals of care discussion with family considering potentially high risk of mortality during/after this admission -appreciate primary team and Vascular Surgery care    Armond Hang 11/13/2021, 9:13 PM

## 2021-11-21 NOTE — Progress Notes (Addendum)
Paged by nursing staff that patient had episode of coffee-ground emesis.  Prior to this his vital signs were taken and they were stable.  Evaluated patient at bedside, he is resting in bed in mild distress with somewhat labored breathing and accessory muscle use on 3 L nasal cannula.  Family report that patient had some episode of spitting up earlier today and as well as nausea he was given some Zofran.  He had some strawberry Ensure as well as a cup of coffee.  After this family noted episode of vomiting that was dark in color.  Patient is hemodynamically stable with a blood pressure of 156/80.  O2 sat of 86% on 6 L nasal cannula however he is breathing with his mouth open and not through his nose.  Cardiovascular was regular rate and rhythm murmurs gallops or rubs.  He had diffuse wheezing and some rhonchi.  He was tender to palpate his lateral ribs but no epigastric tenderness appreciated.  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  No history of EGD in our system.  We will get a CBC, BMP and chest x-ray to rule out aspiration.  We will also have him use his albuterol inhaler to see if that improves his wheezing.  It is possible that his vomit consistency is from his strawberry Ensure/coffee.  Patient continues to have episodes we will plan to place NG tube and call GI.  Addendum 1900  Patient remained hemodynamically stable.  He has 2 IVs in place.  He was typed and screened yesterday in preparation for his procedure.  Awaiting CBC results, BUN is elevated to 21 from 12 and creatinine is up to 1.5 from 1.  He has had some spit up but has not had any vomiting since.  We will go ahead and order IV Protonix. His breathing has improved since albuterol treatment  Addemdum 2005 Hgb of 13.5, stable from yesterdays of 13.2. Family note additional episode of dark colored emesis. Will  trend Hgb every 4 hours and plan for GI consult.

## 2021-11-21 NOTE — Progress Notes (Addendum)
Subjective: ON: On call team paged for dark colored emesis after having coffee and ensure. Pt was HDS with no other acute concerns at this time. CXR for concerns of aspiration negative. CBC with new leukocytosis 12.5 and BMP with AKI. BUN is elevated to 21 from 12 and creatinine is up to 1.5 from 1. CBC largely reassuring. with No prior EGD. Protonix started. GI consulted, will assess pt today.    Patient was seen at bedside during rounds today. 2 daughter and son in law are at bedside. They assist with the conversation. Pt is more interactive today compared to yesterday. Family report that they are still deciding whether or not to proceed with surgery. He complains of abdominal discomfort.   Objective:  Vital signs in last 24 hours: Vitals:   11/22/2021 0130 11/16/2021 0509 11/19/2021 0756 12/06/2021 1252  BP: 131/83 107/68 136/73 100/72  Pulse: 87 76 68 70  Resp: 20 16 18 18   Temp: 99 F (37.2 C) 98.7 F (37.1 C) 98.1 F (36.7 C) 98 F (36.7 C)  TempSrc: Axillary Oral Oral Oral  SpO2: 95% 97% 93% 93%  Weight:      Height:       Constitutional: chronically ill-appearing, anxious, and in NAD  Eyes: conjunctiva non-erythematous, EOMI Cardiovascular: RRR, no m/r/g, non-edematous bilateral LE Pulmonary/Chest: normal work of breathing on supplemental O2, LCTAB Abdominal: non-distended, soft, ton-tender TP Neurological: disoriented, difficult to follow instructions   Skin: warm and dry, some bruises along left arm and right hand, no skin breakdown of heels. 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 Plan for bilateral femoral endarterectomy discontinued as family wish to  proceed with palliative car. Dr Stanford Breed explained the risks of not operating and they understand.  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 Risk factor includes Vitamin D deficiency (10) in the s/o medication non-compliance. Working on controlling his pain. Palliative following. Family wish to proceed with pinning with local anesthesia and pain management. Will adjust pain regimen today, and add bowel regimen given last BM was 2-3 days ago.  - Palliative care following, appreciate assistance  - Ortho re-consulted, appreciate assistance  - Scheduled tylenol 1g TID  - Morphine 2mg  IV q4h prn - Tramadol 50 q6h prn  - Discontinued toradol given concern for GI bleed  - Senna-docusate and Mirilax   - Bladder scan for retention  - Vit D 50000U weekly started - Wean off of O2 as tolerated; suspect he lives near 88-93% and can be weaned off soon. - PT/OT  Severe compression fracture of the T6 vertebral body Complains of back pain. Adjusted pain regimen today as above and will add lidocaine patch.  - Pain regimen as above  - Licocine patch   Severe symptomatic hypertension, resolved  Hx of HTN   BP in 220/120's on admission in setting of pain and missing BP medication. Is tolerating his home PO medications and BP better controlled this morning.  - Control BP prior to surgery (see above) - Discontinue home Lisinopril 20 mg daily given AKI and  RAS  - Increase home Amlodipine 2.5 -> 5 mg daily  - Address pain as above   AKI  Bump in Cr from 1 to 1.5 in setting of emesis. Suspect he may be volume down. Appears dry on exam with skin tenting and delayed cap refill. Will give IVF and see how he does.  - LR 1/2L IVF  - Encourage PO intake  - Avoid nephrotoxins  - Discontinue Lisinopril   - Trend BMP   ?Upper GI bleed vs gastritis  Possible coffee ground emesis last night with new AKI and elevated BUN. CBC largely reassuring. No prior EGD. Ddx also includes gastritis. GI consulted; offered EGD but pt refused workup.  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 Smoking cessation has been encouraged in the past. Pt refuses to use nicotine patches per family. He is not interested in quitting at this time. 1 cm incidental irregular nodule concerning for neoplasm found on CTA this admission.  - Nicotine patches  - Needs outpatient follow up for possible lung biopsy - Palliative care consulted  Incidental CTA findings  Numerous hepatic cysts, and additional too small to characterize hypodensities in the left kidney. - Continue with outpatient follow up     Best Practice: Diet: NPO  IVF: None,None VTE: SCDs Code: Full   Lajean Manes, MD  Internal Medicine Resident, PGY-1 Pager: [phone] After 5pm on weekdays and 1pm on weekends: On Call pager [phone]

## 2021-11-21 NOTE — Care Plan (Addendum)
Plan of Care  -pt workup overnight found to have massive aortic aneurysm that is potentially life threatening if it were to rupture -reviewed situation with Vascular Surgery colleagues who plan complex intervention on Wednesday -although we could proceed with urgent fixation of right hip, in reality there is serious concern proceeding with orthopaedic surgery prior to aneurysm repair in setting of severe hypertension since admission. Rupture of this aneurysm during hip fixation would likely be immediately fatal intraop and extremely difficult to salvage -the patient is minimally ambulatory and has an impacted nondisplaced likely incomplete subcapital right femoral neck fracture in stable alignment on multiple imaging studies since admission -we will likely wait for aneurysm repair prior to considering fixing or replacing the right hip -will coordinate with Vascular and Ortho Trauma colleagues on safest approach in this unique situation -discussed all of this with patient's daughter Domanic Matusek who has been helping with medical decision making since admission. She understands and is in agreement with this plan -would also recommend anesthesiology consultation for pain control with potential block as well as goals of care discussion with family considering potentially high risk of mortality during/after this admission  Armond Hang, MD Orthopaedic Surgery [facility]

## 2021-11-22 DIAGNOSIS — Z7189 Other specified counseling: Secondary | ICD-10-CM

## 2021-11-22 DIAGNOSIS — I714 Abdominal aortic aneurysm, without rupture, unspecified: Secondary | ICD-10-CM | POA: Diagnosis not present

## 2021-11-22 DIAGNOSIS — D62 Acute posthemorrhagic anemia: Secondary | ICD-10-CM

## 2021-11-22 DIAGNOSIS — I716 Thoracoabdominal aortic aneurysm, without rupture, unspecified: Secondary | ICD-10-CM | POA: Diagnosis not present

## 2021-11-22 DIAGNOSIS — E559 Vitamin D deficiency, unspecified: Secondary | ICD-10-CM | POA: Diagnosis not present

## 2021-11-22 DIAGNOSIS — S72001A Fracture of unspecified part of neck of right femur, initial encounter for closed fracture: Secondary | ICD-10-CM | POA: Diagnosis not present

## 2021-11-22 DIAGNOSIS — Z96642 Presence of left artificial hip joint: Secondary | ICD-10-CM | POA: Diagnosis not present

## 2021-11-22 DIAGNOSIS — Z515 Encounter for palliative care: Secondary | ICD-10-CM

## 2021-11-22 DIAGNOSIS — S72009A Fracture of unspecified part of neck of unspecified femur, initial encounter for closed fracture: Secondary | ICD-10-CM | POA: Diagnosis not present

## 2021-11-22 DIAGNOSIS — K920 Hematemesis: Secondary | ICD-10-CM | POA: Diagnosis not present

## 2021-11-22 LAB — BASIC METABOLIC PANEL
Anion gap: 8 (ref 5–15)
BUN: 27 mg/dL — ABNORMAL HIGH (ref 8–23)
CO2: 26 mmol/L (ref 22–32)
Calcium: 10.1 mg/dL (ref 8.9–10.3)
Chloride: 99 mmol/L (ref 98–111)
Creatinine, Ser: 1.5 mg/dL — ABNORMAL HIGH (ref 0.61–1.24)
GFR, Estimated: 47 mL/min — ABNORMAL LOW (ref >60)
Glucose, Bld: 117 mg/dL — ABNORMAL HIGH (ref 70–99)
Potassium: 4.2 mmol/L (ref 3.5–5.1)
Sodium: 133 mmol/L — ABNORMAL LOW (ref 135–145)

## 2021-11-22 LAB — CBC WITH DIFFERENTIAL/PLATELET
Abs Immature Granulocytes: 0 10*3/uL (ref 0.00–0.07)
Basophils Absolute: 0 10*3/uL (ref 0.0–0.1)
Basophils Relative: 0 %
Eosinophils Absolute: 0 10*3/uL (ref 0.0–0.5)
Eosinophils Relative: 0 %
HCT: 36.7 % — ABNORMAL LOW (ref 39.0–52.0)
Hemoglobin: 11.8 g/dL — ABNORMAL LOW (ref 13.0–17.0)
Lymphocytes Relative: 7 %
Lymphs Abs: 0.8 10*3/uL (ref 0.7–4.0)
MCH: 28.5 pg (ref 26.0–34.0)
MCHC: 32.2 g/dL (ref 30.0–36.0)
MCV: 88.6 fL (ref 80.0–100.0)
Monocytes Absolute: 0.7 10*3/uL (ref 0.1–1.0)
Monocytes Relative: 6 %
Neutro Abs: 10.1 10*3/uL — ABNORMAL HIGH (ref 1.7–7.7)
Neutrophils Relative %: 87 %
Platelets: 179 10*3/uL (ref 150–400)
RBC: 4.14 MIL/uL — ABNORMAL LOW (ref 4.22–5.81)
RDW: 14.4 % (ref 11.5–15.5)
Smear Review: ADEQUATE
WBC: 11.6 10*3/uL — ABNORMAL HIGH (ref 4.0–10.5)
nRBC: 0 % (ref 0.0–0.2)

## 2021-11-22 LAB — SURGICAL PCR SCREEN
MRSA, PCR: NEGATIVE
Staphylococcus aureus: NEGATIVE

## 2021-11-22 MED ORDER — LIDOCAINE 5 % EX PTCH
1.0000 | MEDICATED_PATCH | CUTANEOUS | Status: DC
  Administered 2021-11-22 – 2021-11-27 (×6): 1 via TRANSDERMAL
  Filled 2021-11-22 (×6): qty 1

## 2021-11-22 MED ORDER — VITAMIN D (ERGOCALCIFEROL) 1.25 MG (50000 UNIT) PO CAPS
50000.0000 [IU] | ORAL_CAPSULE | ORAL | Status: DC
  Administered 2021-11-22: 11:00:00 50000 [IU] via ORAL
  Filled 2021-11-22: qty 1

## 2021-11-22 MED ORDER — PANTOPRAZOLE SODIUM 40 MG IV SOLR
40.0000 mg | Freq: Two times a day (BID) | INTRAVENOUS | Status: DC
  Administered 2021-11-22 – 2021-11-26 (×8): 40 mg via INTRAVENOUS
  Filled 2021-11-22 (×8): qty 10

## 2021-11-22 MED ORDER — POLYETHYLENE GLYCOL 3350 17 G PO PACK
17.0000 g | PACK | Freq: Every day | ORAL | Status: DC
  Administered 2021-11-22 – 2021-11-26 (×3): 17 g via ORAL
  Filled 2021-11-22 (×5): qty 1

## 2021-11-22 MED ORDER — LIDOCAINE 5 % EX PTCH
1.0000 | MEDICATED_PATCH | CUTANEOUS | Status: DC
  Administered 2021-11-22 – 2021-11-27 (×6): 1 via TRANSDERMAL
  Filled 2021-11-22 (×7): qty 1

## 2021-11-22 MED ORDER — PANTOPRAZOLE SODIUM 40 MG IV SOLR
40.0000 mg | INTRAVENOUS | Status: DC
  Administered 2021-11-22: 40 mg via INTRAVENOUS
  Filled 2021-11-22: qty 10

## 2021-11-22 MED ORDER — ADULT MULTIVITAMIN W/MINERALS CH
1.0000 | ORAL_TABLET | Freq: Every day | ORAL | Status: DC
  Administered 2021-11-22 – 2021-11-26 (×5): 1 via ORAL
  Filled 2021-11-22 (×6): qty 1

## 2021-11-22 MED ORDER — LACTATED RINGERS IV BOLUS
500.0000 mL | Freq: Once | INTRAVENOUS | Status: AC
  Administered 2021-11-22: 500 mL via INTRAVENOUS

## 2021-11-22 MED ORDER — ACETAMINOPHEN 500 MG PO TABS
1000.0000 mg | ORAL_TABLET | Freq: Three times a day (TID) | ORAL | Status: DC
  Administered 2021-11-22 – 2021-11-25 (×8): 1000 mg via ORAL
  Filled 2021-11-22 (×10): qty 2

## 2021-11-22 MED ORDER — ACETAMINOPHEN 500 MG PO TABS: 1000.0000 mg | ORAL_TABLET | Freq: Three times a day (TID) | ORAL | Status: DC

## 2021-11-22 MED ORDER — ENSURE ENLIVE PO LIQD
237.0000 mL | Freq: Two times a day (BID) | ORAL | Status: DC
  Administered 2021-11-22 – 2021-11-26 (×5): 237 mL via ORAL

## 2021-11-22 MED ORDER — ACETAMINOPHEN 650 MG RE SUPP: 650.0000 mg | Freq: Three times a day (TID) | RECTAL | Status: DC

## 2021-11-22 MED ORDER — AMLODIPINE BESYLATE 5 MG PO TABS
5.0000 mg | ORAL_TABLET | Freq: Every day | ORAL | Status: DC
  Administered 2021-11-23 – 2021-11-25 (×3): 5 mg via ORAL
  Filled 2021-11-22 (×3): qty 1

## 2021-11-22 MED ORDER — ACETAMINOPHEN 650 MG RE SUPP: 975.0000 mg | Freq: Three times a day (TID) | RECTAL | Status: DC

## 2021-11-22 MED ORDER — SENNOSIDES-DOCUSATE SODIUM 8.6-50 MG PO TABS
1.0000 | ORAL_TABLET | Freq: Every day | ORAL | Status: DC
  Administered 2021-11-22 – 2021-11-23 (×2): 1 via ORAL
  Filled 2021-11-22 (×2): qty 1

## 2021-11-22 NOTE — Plan of Care (Signed)

## 2021-11-22 NOTE — Progress Notes (Incomplete)
Daughter Arrie Aran doesn't want to sign the consent at this moment.

## 2021-11-22 NOTE — Progress Notes (Signed)
The patient is injury-free, afebrile, alert, and oriented X 1. Vital signs were within the baseline during this shift. He complained of hip pain, nausea, and vomiting, which are controlled with current pain and antiemetic regimens. Pt denies chest pain, SOB, dizziness, signs or symptoms of acute changes during this shift. We will continue to monitor and work toward achieving the care plan goals

## 2021-11-22 NOTE — Consult Note (Addendum)
Attending physician's note   I have taken a history, reviewed the chart, and examined the patient. I performed a substantive portion of this encounter, including complete performance of at least one of the key components, in conjunction with the APP. I agree with the APP's note, impression, and recommendations with my edits.   81 year old male with history of HTN, left hip fracture, throat cancer 10+ years ago, admitted with right hip fracture after fall.  Hip surgery being postponed due to large thoracoabdominal aortic aneurysm.  GI service was consulted for evaluation of coffee-ground emesis which started after admission.  No reported melena.  -H/H 11.8/36.7 with MCV/RDW 88.6/14.4.  Was 13.5/40.8 on admission yesterday - WBC 11.6 - BUN/creatinine 27/1.5 (from 12/1.04 on admission) - CT with thickened folds in the stomach and some of the left abdominal small bowel.  Prominent wall in the ascending colon with mild to moderate fecal stasis.  Sigmoid diverticulosis without diverticulitis.  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  SMA heavily calcified but no stenosis.  IMA occluded.  Patient was evaluated by Vascular Surgery.  After discussion of complex surgical repair of large thoracoabdominal aortic aneurysm with large infrarenal component, the family opted instead for palliative measures.  1) Coffee-ground emesis 2) Acute blood loss anemia 3) Gastritis on CT - Discussed DDx for potential UGIB with patient and his family members at bedside today.  Patient does not want to proceed with EGD for diagnostic and/or therapeutic intent.  Based on his CT findings of large thoracoabdominal aortic aneurysm and significant atherosclerosis of intra-abdominal vasculature along with stenosis of celiac artery, I agree that endoscopy would be elevated risk.  Patient would instead like to proceed with medical management alone and I agree - Protonix 40 mg  iv twice daily for now, and can transition to po bid after 72 hours - Continue slowly advancing diet as tolerated - Continue serial H/H checks with blood products as needed per protocol - As above, patient and family opting for palliative measures  4) Aortic aneurysm 5) Hypertension - Was evaluated by Vascular Surgery with plan to proceed with palliative measures instead of surgery  6) Right hip fracture - Management Per Orthopedic Surgery service  GI service will sign off at this time.  Please do not hesitate to contact with additional questions or concerns  Sergio Heck, DO, FACG [phone] office           Consultation  Referring Provider:   Dr. Dareen Piano Primary Care Physician:  Saralyn Pilar, MD Primary Gastroenterologist: Althia Forts        Reason for Consultation: Coffee-ground emesis            HPI:   Sergio Chavez is a 81 y.o. male with a past medical history of hypertension, prior left hip fracture and throat cancer who presented to the ED for evaluation on 11/26/2021 after a fall.  Patient found to have a right femoral neck fracture which has since been fixed 11/11/2021.  We are called in regards to some coffee-ground emesis.    Today, patient is seen with all of his children around him, 2 daughters and a son.  They explain that he started vomiting coffee-ground material yesterday and it was quite forceful and quite a lot at least 3-4 times, since this morning he has just been spitting up occasionally and it seems to be less in quantity.  They are actively cleaning it up with suction  and an emesis bag.  Described history of throat cancer "at least 10 years ago".  Apparently patient has also been complaining of some abdominal pain though when I actually asked him he denies it. No BM in 2-3 days.    He tells me that his hip still hurts and that "I do not want any procedures".    Reports colonoscopy about 10 years ago outside of [place].    Denies fever,  chills, weight loss, heartburn or reflux.     Past Medical History:  Diagnosis Date   Hypertension    Throat cancer [facility])     Past Surgical History:  Procedure Laterality Date   BACK SURGERY     HIP ARTHROPLASTY Left 12/04/2020   Procedure: HIP HEMIARTHROPLASTY;  Surgeon: Altamese [place], MD;  Location: [facility];  Service: Orthopedics;  Laterality: Left;   PROSTATE SURGERY      Family History  Problem Relation Age of Onset   Diabetes Mellitus II Father     Social History   Tobacco Use   Smoking status: Every Day    Packs/day: 0.50    Types: Cigarettes   Smokeless tobacco: Never  Vaping Use   Vaping Use: Never used  Substance Use Topics   Alcohol use: No   Drug use: Never    Prior to Admission medications   Medication Sig Start Date End Date Taking? Authorizing Provider  acetaminophen (TYLENOL) 325 MG tablet Take 2 tablets (650 mg total) by mouth every 6 (six) hours as needed for mild pain or moderate pain. 12/07/20  Yes Ainsley Spinner, PA-C  amLODipine (NORVASC) 2.5 MG tablet Take 1 tablet (2.5 mg total) by mouth daily. For HTN Patient taking differently: Take 2.5 mg by mouth daily. 12/16/20  Yes Fargo, Amy E, NP  diclofenac Sodium (VOLTAREN) 1 % GEL Apply 2 g topically 4 (four) times daily. Patient taking differently: Apply 2 g topically daily as needed (pain). 12/08/20  Yes Vann, Jessica U, DO  lisinopril (ZESTRIL) 20 MG tablet Take 1 tablet (20 mg total) by mouth daily. 12/16/20  Yes Fargo, Amy E, NP  Menthol (ICY HOT) 5 % PTCH Apply 1 patch topically daily as needed (pain).   Yes [provider]  pravastatin (PRAVACHOL) 40 MG tablet Take 1 tablet (40 mg total) by mouth daily. Patient taking differently: Take 40 mg by mouth at bedtime. 12/16/20  Yes Fargo, Amy E, NP  albuterol (PROVENTIL) (2.5 MG/3ML) 0.083% nebulizer solution Take 3 mLs (2.5 mg total) by nebulization every 2 (two) hours as needed for wheezing or shortness of breath. Patient not taking: Reported on  08/31/2021 12/16/20   Yvonna Alanis, NP  enoxaparin (LOVENOX) 40 MG/0.4ML injection Inject 0.4 mLs (40 mg total) into the skin daily. Patient not taking: Reported on 11/27/2021 12/16/20 01/15/21  Yvonna Alanis, NP  feeding supplement (ENSURE ENLIVE / ENSURE PLUS) LIQD Take 237 mLs by mouth 2 (two) times daily between meals. Patient not taking: Reported on 11/12/2021 12/07/20   Geradine Girt, DO  HYDROcodone-acetaminophen (NORCO/VICODIN) 5-325 MG tablet Take 0.5-1 tablets by mouth every 6 (six) hours as needed for severe pain. Patient not taking: Reported on 11/26/2021 08/31/21   Carlisle Cater, PA-C  Vitamin D3 (VITAMIN D) 25 MCG tablet Take 2 tablets (2,000 Units total) by mouth 2 (two) times daily. Patient not taking: Reported on 11/13/2021 12/16/20   Yvonna Alanis, NP    Current Facility-Administered Medications  Medication Dose Route Frequency Provider Last Rate Last Admin  acetaminophen (TYLENOL) tablet 650 mg  650 mg Oral Q6H Rick Duff, MD   650 mg at 11/15/2021 0146   Or   acetaminophen (TYLENOL) suppository 650 mg  650 mg Rectal Q6H Rick Duff, MD       [START ON 11/15/2021] amLODipine (NORVASC) tablet 5 mg  5 mg Oral Daily Rick Duff, MD       iohexol (OMNIPAQUE) 350 MG/ML injection 100 mL  100 mL Intravenous Once PRN Cherre Robins, MD       ipratropium-albuterol (DUONEB) 0.5-2.5 (3) MG/3ML nebulizer solution 3 mL  3 mL Nebulization Q6H PRN Iona Beard, MD   3 mL at 11/24/2021 1751   lactated ringers infusion   Intravenous Continuous Katsadouros, Vasilios, MD 75 mL/hr at 11/22/21 0529 Infusion Verify at 11/22/21 0529   lidocaine (LIDODERM) 5 % 1 patch  1 patch Transdermal Q24H Rick Duff, MD       morphine (PF) 2 MG/ML injection 2 mg  2 mg Intravenous Q4H PRN Rick Duff, MD   2 mg at 11/22/21 0507   nicotine (NICODERM CQ - dosed in mg/24 hours) patch 21 mg  21 mg Transdermal Daily Rick Duff, MD   21 mg at 11/22/21 0811   ondansetron (ZOFRAN) injection  4 mg  4 mg Intravenous Q6H PRN Riesa Pope, MD   4 mg at 11/22/21 0507   senna-docusate (Senokot-S) tablet 1 tablet  1 tablet Oral Daily Rick Duff, MD       traMADol Veatrice Bourbon) tablet 50 mg  50 mg Oral Q6H PRN Rick Duff, MD   50 mg at 11/29/2021 6712    Allergies as of 11/19/2021   (No Known Allergies)     Review of Systems:    Constitutional: No weight loss, fever or chills Skin: No rash  Cardiovascular: No chest pain Respiratory: +chronic SOB Gastrointestinal: See HPI and otherwise negative Genitourinary: No dysuria  Neurological: No headache, dizziness or syncope Musculoskeletal: No new muscle or joint pain Hematologic: No bruising Psychiatric: No history of depression or anxiety    Physical Exam:  Vital signs in last 24 hours: Temp:  [97.7 F (36.5 C)-98.3 F (36.8 C)] 97.7 F (36.5 C) (02/14 0500) Pulse Rate:  [70-101] 86 (02/14 0500) Resp:  [18-22] 20 (02/14 0500) BP: (100-188)/(69-105) 121/69 (02/14 0500) SpO2:  [90 %-98 %] 90 % (02/14 0500) FiO2 (%):  [36 %] 36 % (02/13 1751) Last BM Date :  (PTA) General:   Pleasant Elderly, Ill appearing, frail Caucasian male appears to be in NAD, Well developed, Well nourished, alert and cooperative Head:  Normocephalic and atraumatic.+O2 via [place] Eyes:   PEERL, EOMI. No icterus. Conjunctiva pink. Ears:  Normal auditory acuity. Neck:  Supple Throat: Oral cavity and pharynx without inflammation, swelling or lesion. Lungs: Respirations even and unlabored. Lungs clear to auscultation bilaterally.   No wheezes, crackles, or rhonchi.  Heart: Normal S1, S2. No MRG. Regular rate and rhythm. No peripheral edema, cyanosis or pallor.  Abdomen:  Soft, nondistended, nontender. No rebound or guarding. Normal bowel sounds. No appreciable masses or hepatomegaly. Rectal:  Not performed.  Msk:  Symmetrical without gross deformities. Peripheral pulses intact.  Extremities:  Without edema, no deformity or joint abnormality.   Neurologic:  Alert and  oriented x4;  grossly normal neurologically. Skin:   Dry and intact without significant lesions or rashes. Psychiatric: Demonstrates good judgement and reason without abnormal affect or behaviors.  LAB RESULTS: Recent Labs    11/22/2021 1341 11/22/2021 1818 11/22/21 0240  WBC  9.6 12.3* 11.6*  HGB 13.2 13.5 11.8*  HCT 41.1 40.8 36.7*  PLT 224 228 179   BMET Recent Labs    11/19/2021 1341 11/14/2021 1818 11/22/21 0617  NA 136 133* 133*  K 3.6 3.9 4.2  CL 101 95* 99  CO2 25 25 26   GLUCOSE 90 133* 117*  BUN 12 21 27*  CREATININE 1.04 1.50* 1.50*  CALCIUM 9.2 9.7 10.1   LFT Recent Labs    11/24/2021 2059  PROT 6.6  ALBUMIN 3.5  AST 20  ALT 16  ALKPHOS 86  BILITOT 0.9  BILIDIR 0.2  IBILI 0.7   PT/INR Recent Labs    11/17/2021 1341  LABPROT 14.2  INR 1.1    STUDIES: DG Chest 1 View  Result Date: 11/16/2021 CLINICAL DATA:  Low O2 sats. EXAM: CHEST  1 VIEW COMPARISON:  07/01/2021 FINDINGS: 1601 hours. Lungs are hyperexpanded. Interstitial markings are diffusely coarsened with chronic features. The cardio pericardial silhouette is enlarged. Bones are diffusely demineralized. Telemetry leads overlie the chest. IMPRESSION: Chronic interstitial coarsening. No acute cardiopulmonary findings. Electronically Signed   By: Misty Stanley M.D.   On: 12/01/2021 16:13   DG Ankle Complete Left  Result Date: 11/16/2021 CLINICAL DATA:  Unwitnessed fall EXAM: LEFT ANKLE COMPLETE - 3+ VIEW; RIGHT ANKLE - COMPLETE 3+ VIEW COMPARISON:  None. FINDINGS: There is no evidence of fracture, dislocation, or joint effusion. There is no evidence of arthropathy or other focal bone abnormality. Soft tissues are unremarkable. IMPRESSION: No fracture or dislocation of the bilateral ankles. Electronically Signed   By: Delanna Ahmadi M.D.   On: 12/02/2021 14:49   DG Ankle Complete Right  Result Date: 11/27/2021 CLINICAL DATA:  Unwitnessed fall EXAM: LEFT ANKLE COMPLETE - 3+ VIEW; RIGHT  ANKLE - COMPLETE 3+ VIEW COMPARISON:  None. FINDINGS: There is no evidence of fracture, dislocation, or joint effusion. There is no evidence of arthropathy or other focal bone abnormality. Soft tissues are unremarkable. IMPRESSION: No fracture or dislocation of the bilateral ankles. Electronically Signed   By: Delanna Ahmadi M.D.   On: 11/17/2021 14:49   CT Hip Right Wo Contrast  Addendum Date: 11/17/2021   ADDENDUM REPORT: 11/24/2021 20:03 ADDENDUM: There is an error within the Findings and Impression sections of the original report. The described femoral neck fracture is subcapital in location, not basicervical. This change was discussed with Dr. Kathaleen Bury at the time of addendum. Electronically Signed   By: Davina Poke D.O.   On: 11/29/2021 20:03   Result Date: 11/19/2021 CLINICAL DATA:  Unwitnessed fall.  Hip pain.  Abnormal x-ray EXAM: CT OF THE RIGHT HIP WITHOUT CONTRAST TECHNIQUE: Multidetector CT imaging of the right hip was performed according to the standard protocol. Multiplanar CT image reconstructions were also generated. RADIATION DOSE REDUCTION: This exam was performed according to the departmental dose-optimization program which includes automated exposure control, adjustment of the mA and/or kV according to patient size and/or use of iterative reconstruction technique. COMPARISON:  Hip x-ray 11/26/2021.  CT abdomen pelvis 12/23/2004 FINDINGS: Bones/Joint/Cartilage Acute minimally impacted basicervical fracture of the lateral aspect of the right femoral neck (series 6, images 40-47). No evidence of disruption of the medial cortex at this time. Hip joint intact without dislocation. 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 Visualized bony pelvis intact without fracture or diastasis. No lytic or sclerotic bone lesion identified. Ligaments Suboptimally assessed by CT. Muscles and Tendons No acute musculotendinous abnormality by CT. Soft tissues No fluid collection  or hematoma  about the hip. Infrarenal abdominal aortic aneurysm is partially imaged measuring 7.5 x 7.4 cm in transaxial dimension (series 3, image 1). There is peripheral atherosclerotic calcification of the aneurysm sac. Heavily calcified iliac arteries. Colonic diverticulosis. IMPRESSION: 1. Acute minimally impacted basicervical fracture of the lateral aspect of the right femoral neck. 2. Infrarenal abdominal aortic aneurysm measuring up to 7.5 cm in diameter. Recommend referral to a vascular specialist. This recommendation follows ACR consensus guidelines: White Paper of the ACR Incidental Findings Committee II on Vascular Findings. J Am Coll Radiol 2013; 10:789-794. Electronically Signed: By: Davina Poke D.O. On: 11/22/2021 16:24   DG CHEST PORT 1 VIEW  Result Date: 11/16/2021 CLINICAL DATA:  Wheezing, aspiration, history of head and neck cancer EXAM: PORTABLE CHEST 1 VIEW COMPARISON:  11/24/2021 FINDINGS: Single frontal view of the chest demonstrates stable dilation of the thoracic aorta, with extensive atherosclerosis again noted. Cardiac silhouette is unchanged. Stable emphysema. Trace bilateral effusions again noted. No new airspace disease or pneumothorax. Please refer to recent chest CT report describing a spiculated left upper lobe pulmonary nodule. No acute bony abnormalities. IMPRESSION: 1. Emphysema, with stable trace bilateral effusions. 2. Please refer to recent chest CT describing left upper lobe pulmonary nodule. Electronically Signed   By: Randa Ngo M.D.   On: 11/19/2021 20:01   DG CHEST PORT 1 VIEW  Result Date: 11/10/2021 CLINICAL DATA:  No history EXAM: PORTABLE CHEST 1 VIEW.  Patient is rotated and tilted. COMPARISON:  Chest x-ray 11/16/2021 4 p.m., chest x-ray 01/23/2011, chest x-ray 08/31/2021 FINDINGS: The heart and mediastinal contours are grossly unchanged given rotation. Similar-appearing marked atherosclerotic plaque and aneurysmal dilatation of the thoracic aorta. No  focal consolidation. Chronic coarsened markings. Left costophrenic angle collimated off view. No pleural effusion. No pneumothorax. No acute osseous abnormality. IMPRESSION: 1. No active disease in a patient with chronic coarsened interstitial markings. Limited evaluation due to patient rotation. 2. Marked atherosclerotic plaque and with grossly similar-appearing (compared to chest x-ray 08/31/2021) aneurysmal dilatation of the thoracic aorta. Aortic aneurysm NOS (ICD10-I71.9). Aortic Atherosclerosis (ICD10-I70.0). Recommend CTA for further evaluation. 3. Left costophrenic angle collimated off view with trace left pleural effusion not excluded. Electronically Signed   By: Iven Finn M.D.   On: 11/19/2021 22:33   CT Angio Chest/Abd/Pel for Dissection W and/or W/WO  Result Date: 12/06/2021 CLINICAL DATA:  Unwitnessed fall with subcapital fracture of the right femoral neck seen on CT today, with incidental AAA also seen. EXAM: CT ANGIOGRAPHY CHEST, ABDOMEN AND PELVIS TECHNIQUE: Non-contrast CT of the chest was initially obtained. Multidetector CT imaging through the chest, abdomen and pelvis was performed using the standard protocol during bolus administration of intravenous contrast. Multiplanar reconstructed images and MIPs were obtained and reviewed to evaluate the vascular anatomy. RADIATION DOSE REDUCTION: This exam was performed according to the departmental dose-optimization program which includes automated exposure control, adjustment of the mA and/or kV according to patient size and/or use of iterative reconstruction technique. CONTRAST:  60mL OMNIPAQUE IOHEXOL 350 MG/ML SOLN COMPARISON:  Limited comparison studies are available, of the abdomen and pelvis CT with contrast from 12/23/2004 and the soft tissue neck CT which partially included the upper chest on 01/01/2016. A PA and lateral chest is available from 01/23/2011 but after that the only chest x-rays are portable films, most recently 12/03/2020  and today. FINDINGS: CTA CHEST FINDINGS Cardiovascular: No thoracic aortic hematoma seen on initial noncontrast imaging. A pulmonary arterial phase study was also performed. Prominent pulmonary trunk 3.4 cm indicating  arterial hypertension. There is mild IVC reflux. There are questionable filling defects in some of the subsegmental right lower lobe arteries, reference images series 7 axial 83, 84, 91. There is no other evidence of arterial embolus, and this could be being artifactually produced by respiratory motion artifact. There is cardiomegaly with a slight right chamber predominance, no pericardial effusion is seen. There is three-vessel coronary artery calcification. There is extensive calcification of the thoracic aorta and branch arteries. This is associated with 75% calcific stenosis in the flow subclavian artery 3.5 cm distal to its origin. No other stenosis in the great vessels is seen. Borderline/slightly aneurysmal aortic root measuring 4.0 cm, aneurysmal ascending segment measuring 4.3 cm, arch segment is 3.5 cm, with descending segment tortuosity with caliber up to 3.5 cm. There is scattered soft plaque in the descending segment some of which is ulcerative but no penetrating ulcer seen or dissection. The superior pulmonary veins are distended. Mediastinum/Nodes: Esophagus demonstrates a mildly patulous appearance with mild fluid retention at and below the carinal level. There are mucoid secretions along the wall of the distal trachea. The trachea is otherwise clear. There is no intrathoracic or axillary adenopathy. No thyroid mass. Lungs/Pleura: minimal layering pleural effusions. There are posterior opacities in the lower lobes which could be due to atelectasis or consolidation. There is thickening and some of the lower lobe bronchi. The lungs are moderately emphysematous with centrilobular and paraseptal changes predominating in the upper lobes, with scattered linear scar-like opacities. There is  pleural-parenchymal scarring in the apices with bronchiolectasis. Mild posterior atelectasis in the upper lobes. There is an irregular 9.7 x 7.8 mm noncalcified left upper lobe nodule anteriorly on series 11 axial 50 concerning for neoplasm. No other nodule is seen. There is no appreciable subpleural edema. Musculoskeletal: A severe T6 compression fracture with slight retropulsion of the posteroinferior cortex is new from the prior studies and is probably recent, with increased density of the compacted trabeculae and mild paraspinal soft tissue fullness most likely a small paraspinal hematoma at this level. There is no pedicle or posterior element fracture with moderate chronic compression fractures again at T8 and T10 and mild chronic compression fracture again seen at T9. There is osteopenia without appreciable acute displaced fractures in the ribcage and sternum. There is a chronic healed fracture deformity of the upper third of the body of the sternum. Review of the MIP images confirms the above findings. CTA ABDOMEN AND PELVIS FINDINGS VASCULAR Aorta: In 2006 there was a 3 cm AAA with heavy aortoiliac calcific plaques. This has significantly enlarged. The suprarenal aorta has become aneurysmal now measuring 3.6 x 3.7 cm along most of its length. The infrarenal aorta now measures up to 7.7 x 7.3 cm transverse and AP and extends a length of 8.2 cm, ending just above the iliac bifurcation. The larger aneurysmal sac is mostly thrombosed. There is a preserved irregular lumen within the aneurysm measuring up to 3.3 x 2.3 cm in the mid aneurysmal sac tapering to 2.3 by 1.9 cm just above the bifurcation. Celiac: High-grade severe stenosis 1.2 cm distal to its origin is seen due to compression by the median arcuate ligament of the diaphragm. The vessel then reconstitutes, with patchy calcification in the distal celiac artery and branch arteries without flow-limiting stenoses elsewhere. SMA: Heavily calcified but does  not show flow significant stenosis. Renals: The renal artery ostia are heavily calcified. There is a 75% focal stenosis in the proximal left renal artery, remainder of which is patent.  High-grade severe stenosis is seen in the proximal 1 cm of the right renal artery, associated with right renal atrophy and both renal arteries are single. IMA: Occluded. Inflow: The iliac arteries are extensively calcified. Right common iliac artery shows only about 30% stenosis, with 75% or greater proximal stenosis in the left common iliac artery with the remainder showing up to about 40% stenosis. 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 The remainder shows patchy calcification without flow-limiting stenosis. Left internal iliac artery and the right external iliac artery are both heavily calcified and occluded. The left external iliac artery is about 50-60% stenotic along its length. On the right there is reconstitution of an up to 70% stenotic right common femoral artery presumably through the deep circumflex iliac and inferior epigastric arteries providing collateral flow. Left common femoral artery is at least 75% stenotic due to calcifications. Veins: No obvious venous abnormality within the limitations of this arterial phase study. Review of the MIP images confirms the above findings. NON-VASCULAR Hepatobiliary: There are numerous hepatic cysts, larger than previously. Largest in the left lobe 6 cm, largest in the right lobe 3.3 cm. A few additional tiny too small to characterize hypodensities are also present. There is no mass. Gallbladder is distended containing a single stone posteriorly but no wall thickening, no biliary dilatation noted. Pancreas: Unremarkable. Spleen: Unremarkable. Stomach/bowel: There are thickened folds in the stomach and some of the left abdominal small bowel. There is prominent wall in the ascending colon, mild-to-moderate fecal stasis. Diffuse sigmoid diverticula  without inflammation. There is no small bowel obstruction. An appendix is not seen. There is impacted stool in the rectum. The wall is not well seen due to spray artifact from his left hip arthroplasty. Adrenals/urinary tract: There is asymmetric atrophy of the right kidney. There are small hypodensities in the left kidney which are too small to characterize but probably cysts. There is no hydronephrosis. The bladder is unremarkable allowing for spray artifact from the adjacent left hip replacement. Lymphatic: No lymphadenopathy is seen. Reproductive: Enlarged prostate, 5.1 cm transverse. There is a TURP defect within. There is impression on the bladder. Other: Mild body wall edema seen. There is no free air, hemorrhage or fluid. Musculoskeletal: Slightly impacted subcapital proximal right femoral fracture. No lumbar compression fracture is seen. Left hip replacement. Osteopenia and degenerative change. Review of the MIP images confirms the above findings. IMPRESSION: 1. Extensive aortic and branch vessel atherosclerosis. Additional coronary artery atherosclerosis. 2. 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 Consultation with a vascular surgeon is recommended. 3. Inflow and outflow stenoses as detailed above. Of note the right external iliac artery is occluded as is the left internal iliac artery. A stenotic right common femoral artery is reconstituted apparently via collateral flow from the deep circumflex iliac and right inferior epigastric arteries. 4. 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 5. About 75% proximal left renal artery stenosis is seen with high-grade severe calcific stenosis in the proximal 1  cm of the right renal artery with right renal atrophy. 6. Cardiomegaly with slight right chamber predominance, a prominent pulmonary trunk, and small questionable filling defects may be present in some of the subsegmental right lower lobe arteries but this  is difficult to confirm due to the degree of respiratory motion. If these do represent arterial emboli the clot burden is small but the prominent pulmonary trunk and expanded right chambers suggest right heart strain, and there is distention of the central pulmonary veins without edema. 7. Minimal pleural effusions with posterior basal lower lobe opacities which could be atelectasis or consolidation. 8. Irregular left upper lobe nodule approaching 1 cm concerning for neoplasm. Consider PET-CT or tissue sampling. No adenopathy is seen. 9. Numerous hepatic cysts, additional too small to characterize hypodensities in the left kidney. 10. Ascending colitis versus nondistention or congestive wall thickening. Prominent gastroenteritis as well. Constipation including can not can not with impacted feces in the rectum. 11. Severe compression fracture of the T6 vertebral body, which appears to be recent with paraspinal soft tissue fullness consistent with hematoma and slight nonstenosing posteroinferior cortical retropulsion. 12. Acute subcapital proximal right femoral fracture. 13. Prostatomegaly with TURP defect. 14. 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 15. Additional findings discussed above. Electronically Signed   By: Telford Nab M.D.   On: 12/01/2021 01:39   DG Hip Unilat With Pelvis 2-3 Views Right  Result Date: 11/19/2021 CLINICAL DATA:  Fall, hip pain EXAM: DG HIP (WITH OR WITHOUT PELVIS) 2-3V RIGHT COMPARISON:  12/04/2020 FINDINGS: Diffuse bony demineralization. Findings suspicious for a minimally impacted right femoral neck fracture, best seen on frog-lateral view. Pelvic bony ring appears intact without fracture or diastasis.  Prior left hip hemiarthroplasty. Atherosclerotic calcifications are present. IMPRESSION: Findings suspicious for a minimally impacted right femoral neck fracture. Consider CT or MRI for confirmation if indicated. Electronically Signed   By: Davina Poke D.O.   On: 11/13/2021 14:48     Impression / Plan:   Impression: 1.  Coffee-ground emesis: Hemoglobin 13.5--> 11.8 overnight, apparently had multiple episodes of coffee-ground emesis yesterday around 3-4 that started about 5 PM, now just spitting up some occasional coffee-ground looking material, had apparently been nauseous yesterday with some spitting up and had been given Zofran, BUN increased from 12-21, started on IV Protonix, family reporting another episode of dark-colored emesis, patient declining work-up; likely gastritis/esophagitis versus PUD 2.  Acute right femoral neck fracture: Status post fix 2/13 3.  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  Emphysema: On 3 L O2 via nasal cannula 5. Constipation: CT with question of impacted feces in the rectum  Plan: 1.  Discussed EGD for further evaluation of coffee-ground emesis including risks and benefits.  Patient tells me he does not want to have any more procedures. 2.  Recommend continuing to monitor hemoglobin with transfusion as needed less than 7, if patient starts to have more profuse hematemesis or other signs of worsening blood loss, then please ask him if he reconsiders. 3.  Continue IV pantoprazole 40 twice daily 4.  Patient can be on clear liquid diet today 5.  Would recommend possible disimpaction and enemas given CT with question of impacted feces and no bowel movement in the past 2 to 3 days.  Then would start gentle MiraLAX as able once daily.  Thank you for your kind consultation, we will sign off.  Please call back if patient agrees to procedure.  Sergio Chavez [facility]  11/22/2021, 8:41 AM

## 2021-11-22 NOTE — Progress Notes (Signed)
OT Cancellation Note  Patient Details Name: NICKOLES GREGORI MRN: [id number] DOB: [birth year]   Cancelled Treatment:    Reason Eval/Treat Not Completed: Medical issues which prohibited therapy (pt with active bedrest order awaiting surgery, will follow and evaluate when appropriate.)  Lynnda Child, OTD, OTR/L Acute Rehab [phone]   Kaylyn Lim 11/22/2021, 7:34 AM
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
## 2021-11-22 NOTE — Progress Notes (Addendum)
Subjective: Sergio Chavez  Patient was seen at bedside during rounds today. Ongoing delirium. Is able to tell me that it is his birthday. He is joking with the team today. He continues to interacts with his family. He complains of pain in right hip and leg. No complaints of back pain.   Family wish to proceed with right hip pinning at this time.   Objective:  Vital signs in last 24 hours: Vitals:   12/01/2021 1839 12/05/2021 2000 11/22/21 0100 11/22/21 0500  BP: (!) 162/105 (!) 168/91 116/75 121/69  Pulse: (!) 101 97 92 86  Resp:  20 20 20   Temp:  98.3 F (36.8 C) 97.9 F (36.6 C) 97.7 F (36.5 C)  TempSrc:  Axillary Axillary Axillary  SpO2: 94% 93% 98% 90%  Weight:      Height:       Constitutional: chronically ill-appearing, anxious but interactive, and in NAD Eyes: conjunctiva non-erythematous, EOMI Cardiovascular: RRR, no m/r/g, non-edematous bilateral LE Pulmonary/Chest: normal work of breathing on supplemental O2, LCTAB Abdominal: non-distended, soft, non-TTP Neurological: disoriented, difficult to follow instructions   Skin: warm and dry, some bruises along left arm and right hand, no skin breakdown of heels. Skin tenting with delayed cap refill.   Assessment/Plan:  Principal Problem:   Femoral neck fracture (HCC) Active Problems:   Closed displaced fracture of right femoral neck (HCC)   Abdominal aortic aneurysm (AAA) without rupture   Advanced care planning/counseling discussion   Palliative care by specialist   Coffee ground emesis   Acute blood loss anemia  Acute right femoral neck fracture Hx of left hemiarthroplasty 11/2020  Vitamin D insuffiencey   Severe compression fracture of the T6 vertebral body Surgical pinning previously postponed until after AAA endarterectomy. Pt and family no longer wish to proceed with vascular surgery, opting in favor of palliative care. Ortho re-consulted and following; ortho team is in the process of finding appropriate surgeon for  this challenging case and OR time, but he is tentatively posted for 2/17 at 315 pm for the OR. Have advanced his diet for today. Optimizing pain regimen in the meantime and continuing to control his BP.  - Ortho following, appreciate assistance  - Palliative care following, appreciate assistance  - Scheduled tylenol 1g TID  - Morphine 2mg  IV q4h prn - Tramadol 50 X5Q prn  - Licocine patch for back and hip  - Senna-docusate and Mirilax   - Dulcolax suppository added - Bladder scan done -> no retention    - Vit D 50000U weekly started - Weaning off of O2 as tolerated to reduce risk of delirium  - PT/OT  Thoracoabdominal aortic aneurysm Thrombosed infrarenal AAA extending 8.2 cm in length, with descending aortic scattered ulcerative soft plaque without penetrating ulcer or dissection. Pt and family wish to proceed with palliative care at this time. Dr Stanford Breed explained the risks of not operating, including the risk of rupture, and family understands.  - Palliative care following, appreciate assistance   Severe symptomatic hypertension, resolved  Hx of HTN   BP in 220/120's on admission. BP better controlled now, however was hypertensive SBP 160-170's last night and early this morning; I suspect this is d/t pain, given he did not receive much pain medications yesterday and was complaining of pain this AM.  - Continue increased dose of amlodipine 5 mg daily  - Gave IV Labetalol 10 mg one time given elevated pressure and giving him another liter of IVF this morning - Discontinued home Lisinopril 20  mg daily given AKI and RAS  - Address pain as above   AKI, resolved  Cr improved from 1.5 -> 1.16 with IVF and discontinuing lisinopril. Is still dry on exam, will give him another liter of IVF. Suspect this will increase his BP, and have given him IV Labetalol this morning.  - Encourage PO intake  - Avoid nephrotoxins  - Discontinued Lisinopril  - Trend BMP   ?Upper GI bleed vs gastritis   Possible coffee ground emesis last night with new AKI and elevated BUN. GI consulted; offered EGD workup refused. No indication that he is losing blood at this time. Trending CBC's, and Hgb stable at this time.  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 Smoking cessation has been encouraged in the past. He is not interested in quitting at this time, and it provides him with happiness.  1 cm incidental irregular nodule concerning for neoplasm found on CTA this admission.  - Nicotine patches  - Needs outpatient follow up for possible lung biopsy - Palliative care following, appreciate assistance   Incidental CTA findings  Numerous hepatic cysts, and additional too small to characterize hypodensities in the left kidney. - Continue with outpatient follow up     Best Practice: Diet: Dysphagia 3 diet   IVF: None VTE: SCDs Code: Full   Lajean Manes, MD  Internal Medicine Resident, PGY-1 Pager: [phone] After 5pm on weekdays and 1pm on weekends: On Call pager [phone]

## 2021-11-22 NOTE — Progress Notes (Signed)
[facility] [place] PROGRESS NOTE  ASSESSMENT / PLAN: Sergio Chavez is a 81 y.o. 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  The patient is frail and not a candidate for open surgery.  I quoted him and his family a high risk of perioperative complication given the complex operative anatomy.  I explained that a perioperative complication would likely result in him transferring to a skilled nursing facility after recovery, which he may never return home from.  I explained that he may die perioperatively.  The family would like to pursue palliative measures.  I think this is appropriate.  I will follow along peripherally.  SUBJECTIVE: Reviewed options with patient and his family today again.  I explained the high likelihood that the patient would require ultimate disposition to a skilled nursing facility, from which he may never return home.  I think this ultimately swayed the patient's family to pursue nonoperative management of this large aneurysm.  They are understanding that this will likely rupture at some point.  OBJECTIVE: BP 121/69 (BP Location: Left Arm)    Pulse 86    Temp 97.7 F (36.5 C) (Axillary)    Resp 20    Ht 5\' 8"  (1.727 m)    Wt 54.9 kg    SpO2 90%    BMI 18.40 kg/m   Intake/Output Summary (Last 24 hours) at 11/22/2021 1452 Last data filed at 11/22/2021 1234 Gross per 24 hour  Intake 1330.18 ml  Output 750 ml  Net 580.18 ml    Frail.  Distress. Regular rate and rhythm Unlabored breathing Soft abdomen.  Palpable aneurysm which is nontender.  CBC Latest Ref Rng & Units 11/22/2021 11/14/2021 11/24/2021  WBC 4.0 - 10.5 K/uL 11.6(H) 12.3(H) 9.6  Hemoglobin 13.0 - 17.0 g/dL 11.8(L) 13.5 13.2  Hematocrit 39.0 - 52.0 % 36.7(L) 40.8 41.1  Platelets 150 - 400 K/uL 179 228 224     CMP Latest Ref Rng & Units 11/22/2021 12/03/2021 12/06/2021  Glucose 70 - 99 mg/dL 117(H) 133(H) 90  BUN 8 - 23 mg/dL 27(H) 21 12   Creatinine 0.61 - 1.24 mg/dL 1.50(H) 1.50(H) 1.04  Sodium 135 - 145 mmol/L 133(L) 133(L) 136  Potassium 3.5 - 5.1 mmol/L 4.2 3.9 3.6  Chloride 98 - 111 mmol/L 99 95(L) 101  CO2 22 - 32 mmol/L 26 25 25   Calcium 8.9 - 10.3 mg/dL 10.1 9.7 9.2  Total Protein 6.5 - 8.1 g/dL - - 6.6  Total Bilirubin 0.3 - 1.2 mg/dL - - 0.9  Alkaline Phos 38 - 126 U/L - - 86  AST 15 - 41 U/L - - 20  ALT 0 - 44 U/L - - 16    Estimated Creatinine Clearance: 30.5 mL/min (A) (by C-G formula based on SCr of 1.5 mg/dL (H)).  Sergio Chavez. Stanford Breed, MD [facility] Phone Number: [phone] 11/22/2021 2:52 PM

## 2021-11-22 NOTE — Progress Notes (Signed)
Initial Nutrition Assessment  DOCUMENTATION CODES:  Severe malnutrition in context of chronic illness, Underweight  INTERVENTION:  Continue regular diet, consider a DYS diet for ease of consumption. Ensure Enlive po BID, each supplement provides 350 kcal and 20 grams of protein. Continue vitamin D repletion MVI with minerals daily  NUTRITION DIAGNOSIS:  Severe Malnutrition (in the context of chronic illness) related to  (inadequate oral intake) as evidenced by severe fat depletion, severe muscle depletion.  GOAL:  Patient will meet greater than or equal to 90% of their needs  MONITOR:  PO intake, Supplement acceptance, Weight trends  REASON FOR ASSESSMENT:  (underweight BMI)    ASSESSMENT:  81 y.o. male with hx of HTN, hx throat cancer (2012, s/p chemo and radiation), and ongoing tobacco use presented to ED with pain after a fall at home. Imaging in ED showed a right hip fracture. AAA incidentally seen during workup.  Per orthopedic team massive AAA seen in imaging needs to be repaired prior to surgical repair of the hip as there is a high risk of rupture. Family and pt currently considering options, Palliative care consulting.  Pt had several occurences of large coffee ground emesis 2/13 and developed AKI in the setting of emesis. GI consulted, pt declined procedures to investigate the cause.   Pt resting in bed at the time of assistance. Daughters at bedside and NT giving a sponge bath. Pt hard of hearing, daughters assist with hx. States that pt is having a hard time swallowing today, food seems to be getting stuck and he regurgitated his lunch. Liquids have been passing easily. Reports that some difficulty swallowing has been present since undergoing treatment for throat cancer, but he is normally able to eat all consistencies.  Pt reports that he has been losing weight since his hospitalization about a year ago. 13.5% weight loss noted in the last year. Pt uses a walker and  lives at home with his wife. States that he never recovered his functional status since his prior hip surgery.   Agreeable to ensure, prefers strawberry.   Will monitor [place] discussions to determine if more aggressive interventions are warranted at this time.   Average Meal Intake: 2/14: 63% intake x 2 recorded meals  Nutritionally Relevant Medications: Scheduled Meds:  pantoprazole (PROTNIX)  40 mg Intravenous Q12H   polyethylene glycol  17 g Oral Daily   senna-docusate  1 tablet Oral Daily   Vitamin D (Ergocalciferol)  50,000 Units Oral Q7 days   PRN Meds: ondansetron  Labs Reviewed: Sodium 133 BUN 27, creatinine 1.5 Vitamin D 10.44   NUTRITION - FOCUSED PHYSICAL EXAM: Flowsheet Row Most Recent Value  Orbital Region Severe depletion  Upper Arm Region Severe depletion  Thoracic and Lumbar Region Severe depletion  Buccal Region Severe depletion  Temple Region Severe depletion  Clavicle Bone Region Moderate depletion  Clavicle and Acromion Bone Region Severe depletion  Scapular Bone Region Severe depletion  Dorsal Hand Moderate depletion  Patellar Region Severe depletion  Anterior Thigh Region Severe depletion  Posterior Calf Region Severe depletion  Edema (RD Assessment) None  Hair Reviewed  Eyes Reviewed  Mouth Reviewed  Skin Reviewed  Nails Reviewed   Diet Order:   Diet Order             Diet NPO time specified Except for: Sips with Meds  Diet effective midnight           Diet regular Room service appropriate? Yes; Fluid consistency: Thin  Diet effective now  EDUCATION NEEDS:  Education needs have been addressed  Skin:  Skin Assessment:  (ecchymosis to the bilateral arms)  Last BM:  prior to admission  Height:  Ht Readings from Last 1 Encounters:  12/01/2021 5\' 8"  (1.727 m)   Weight:  Wt Readings from Last 1 Encounters:  11/16/2021 54.9 kg    Ideal Body Weight:  70 kg  BMI:  Body mass index is 18.4 kg/m.  Estimated  Nutritional Needs:  Kcal:  1600-1800 kcal/d Protein:  80-90g/d Fluid:  1.7-2 L/d   Ranell Patrick, RD, LDN Clinical Dietitian RD pager # available in [place]  After hours/weekend pager # available in [facility]

## 2021-11-23 ENCOUNTER — Encounter (HOSPITAL_COMMUNITY): Admission: EM | Disposition: E | Payer: Self-pay | Source: Home / Self Care | Attending: Internal Medicine

## 2021-11-23 DIAGNOSIS — S72001A Fracture of unspecified part of neck of right femur, initial encounter for closed fracture: Secondary | ICD-10-CM | POA: Diagnosis not present

## 2021-11-23 DIAGNOSIS — E559 Vitamin D deficiency, unspecified: Secondary | ICD-10-CM | POA: Diagnosis not present

## 2021-11-23 DIAGNOSIS — I716 Thoracoabdominal aortic aneurysm, without rupture, unspecified: Secondary | ICD-10-CM | POA: Diagnosis not present

## 2021-11-23 DIAGNOSIS — E43 Unspecified severe protein-calorie malnutrition: Secondary | ICD-10-CM | POA: Insufficient documentation

## 2021-11-23 DIAGNOSIS — Z96642 Presence of left artificial hip joint: Secondary | ICD-10-CM | POA: Diagnosis not present

## 2021-11-23 LAB — CBC
HCT: 36.3 % — ABNORMAL LOW (ref 39.0–52.0)
Hemoglobin: 12 g/dL — ABNORMAL LOW (ref 13.0–17.0)
MCH: 29.3 pg (ref 26.0–34.0)
MCHC: 33.1 g/dL (ref 30.0–36.0)
MCV: 88.5 fL (ref 80.0–100.0)
Platelets: 185 10*3/uL (ref 150–400)
RBC: 4.1 MIL/uL — ABNORMAL LOW (ref 4.22–5.81)
RDW: 14.3 % (ref 11.5–15.5)
WBC: 6.8 10*3/uL (ref 4.0–10.5)
nRBC: 0 % (ref 0.0–0.2)

## 2021-11-23 LAB — BASIC METABOLIC PANEL
Anion gap: 8 (ref 5–15)
BUN: 28 mg/dL — ABNORMAL HIGH (ref 8–23)
CO2: 24 mmol/L (ref 22–32)
Calcium: 9.6 mg/dL (ref 8.9–10.3)
Chloride: 100 mmol/L (ref 98–111)
Creatinine, Ser: 1.16 mg/dL (ref 0.61–1.24)
GFR, Estimated: >60 mL/min (ref >60)
Glucose, Bld: 99 mg/dL (ref 70–99)
Potassium: 4 mmol/L (ref 3.5–5.1)
Sodium: 132 mmol/L — ABNORMAL LOW (ref 135–145)

## 2021-11-23 SURGERY — ENDARTERECTOMY, FEMORAL
Anesthesia: General

## 2021-11-23 MED ORDER — BISACODYL 10 MG RE SUPP: 10.0000 mg | Freq: Once | RECTAL | Status: DC

## 2021-11-23 MED ORDER — NICOTINE 21 MG/24HR TD PT24
21.0000 mg | MEDICATED_PATCH | Freq: Every day | TRANSDERMAL | Status: DC
  Administered 2021-11-23 – 2021-11-26 (×4): 21 mg via TRANSDERMAL
  Filled 2021-11-23 (×4): qty 1

## 2021-11-23 MED ORDER — AMLODIPINE BESYLATE 5 MG PO TABS
5.0000 mg | ORAL_TABLET | Freq: Once | ORAL | Status: AC
  Administered 2021-11-23: 5 mg via ORAL
  Filled 2021-11-23: qty 1

## 2021-11-23 MED ORDER — LABETALOL HCL 5 MG/ML IV SOLN
10.0000 mg | Freq: Once | INTRAVENOUS | Status: AC
  Administered 2021-11-23: 10 mg via INTRAVENOUS
  Filled 2021-11-23: qty 4

## 2021-11-23 MED ORDER — LACTATED RINGERS IV BOLUS
1000.0000 mL | Freq: Once | INTRAVENOUS | Status: AC
  Administered 2021-11-23: 1000 mL via INTRAVENOUS

## 2021-11-23 NOTE — Care Management Important Message (Signed)
Important Message  Patient Details  Name: Sergio Chavez MRN: [id number] Date of Birth: Sep 03, 1941   Medicare Important Message Given:  Yes     Hannah Beat 11/15/2021, 11:44 AM

## 2021-11-23 NOTE — Progress Notes (Signed)
OT Cancellation Note  Patient Details Name: Sergio Chavez MRN: [id number] DOB: 24-Apr-1941   Cancelled Treatment:    Reason Eval/Treat Not Completed: Medical issues which prohibited therapy (active bedrest order, per MD note, plan is for ortho surgery on 2/17. Will follow and evaluate when appropriate.)  Lynnda Child, OTD, OTR/L Acute Rehab [phone]) [phone] - [place] 11/24/2021, 2:41 PM

## 2021-11-23 NOTE — Progress Notes (Signed)
This chaplain responded to PMT consult for holistic family support.  The Pt. is awake and able to appropriately respond to the chaplain. The Pt. shares he is hard of hearing.  The Pt. family: Laverna Peace, and Richardson Landry are at the bedside during the chaplain introduction and rapport building. The chaplain understands one of the family members will be at the bedside most of the time.  The chaplain listens reflectively as the Pt. shares the story of his faith. The chaplain understands the Pt. has an intimate relationship with God and God's love.  The Pt. and family accepted the chaplain's invitation for prayer and F/U spiritual care.  Chaplain Sallyanne Kuster [phone]

## 2021-11-23 NOTE — Progress Notes (Addendum)
Subjective: Overnight:  - On call team evaluated pt for delirium. He was pulling leads. Pt was seen at bedside and re-directed. No further intervention necessary at that time.  - Hypertensive last night SBP 170-180's:  Amlodipine 5 mg given and then later PO 10 mg hydralazine.   Pt is seen at bedside during rounds today. His 2 daughters are present with him. He is more interactive this morning, and is able to engage in meaningful conversation. Continues to have ongoing delirium. Complains of right hip pain. No complaints of back pain or abdominal pain. He is aware that he will have surgery tomorrow.   Objective:  Vital signs in last 24 hours: Vitals:   11/11/2021 0744 11/14/2021 1147 11/17/2021 1217 11/29/2021 1353  BP: (!) 193/102 (!) 180/97  (!) 154/84  Pulse: 97 87  73  Resp: 20 20  18   Temp: 98.2 F (36.8 C)   97.6 F (36.4 C)  TempSrc: Oral   Axillary  SpO2: 93%  94% 96%  Weight:      Height:       Constitutional: chronically ill-appearing, anxious but interactive, and in NAD Cardiovascular: RRR, no m/r/g, non-edematous bilateral LE Pulmonary/Chest: normal work of breathing on supplemental O2, LCTAB Abdominal: non-distended, soft, non-TTP Neurological: more oriented today, but difficult to follow instructions   Skin: warm and dry, some bruises along left arm and right hand, no skin breakdown of heels. Improved skin tenting with IVF.    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 Pt  and family no longer wish to proceed with vascular surgery, opting in favor of palliative care. Ortho re-consulted and following, plan for OR tmrw at 3pm. Optimizing pain regimen in the meantime and continuing to control his BP (see below).  - Ortho following, appreciate assistance: surgery tomorrow afternoon; NPO @ MN - Palliative care following, appreciate assistance  - Scheduled tylenol 1g TID  - Oxycodone IR 2.5 mg Z3G prn added  - Licocine patch for back and hip  - Discontinued Morphine 2mg  IV q4h prn   - Discontinued Tramadol 50 q6h prn  - Senna-docusate and Mirilax   - Dulcolax suppository on board   - Vit D 50000U weekly started - Weaning off of O2 as tolerated    - PT/OT  Thoracoabdominal aortic aneurysm Thrombosed infrarenal AAA extending 8.2 cm in length and 7 cm in diameter, with descending aortic scattered ulcerative soft plaque without penetrating ulcer or dissection. Pt and family wish to proceed with palliative care at this time. Dr Stanford Breed explained the risks of not operating, including the risk of rupture, and family understands.  - Palliative care following, appreciate assistance   Severe chronic asymptomatic hypertension Hypertensive SBP 170-180's last night and early this morning, requiring additional amlodipine 5 mg and po Hydralazine 10 mg.  Will adjust his regimen for better BP control given the large AAA.  - Continue amlodipine 5 mg daily  - Start Hydralazine 10 mg TID  - Holding home Lisinopril 20 mg daily given AKI   Hospital delirium  Ongoing. Pulling at lines and tubes in the evening, but pt is redirectable with the help of family. Will work on minimizing tubes and lines.  - Added Trazodone 25 mg at bedtime  - Discontinued cardiac monitoring   AKI, resolved  Hyponatremia, resolved  Cr improved from 1.5 -> 1.16 -> 0.85 with IVF x 2 days and discontinuing Lisinopril. Hyponatremia in s/o poor po intake near resolved with IVF and improved oral intake.  -  Encourage PO intake  - Avoid nephrotoxins  - Discontinued Lisinopril  - Trend BMP   Possible Upper GI bleed vs gastritis  Possible coffee ground emesis last night with new AKI and elevated BUN. GI consulted; offered EGD workup refused. No indication that he is losing blood at this time. Trending CBC's, and Hgb stable at this time.  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 Smoking cessation has been encouraged in the past, but not interested in quitting.  1 cm incidental irregular nodule concerning for neoplasm found on CTA this admission.  - Nicotine patches  - Needs outpatient follow up for possible lung biopsy - Palliative care following, appreciate assistance   Incidental CTA findings  Numerous hepatic cysts, and additional too small to characterize hypodensities in the left kidney. - Continue with outpatient follow up     Best Practice: Diet: Dysphagia 3 diet   IVF: None VTE: SCDs Code: Full   Lajean Manes, MD  Internal Medicine Resident, PGY-1 Pager: [phone] After 5pm on weekdays and 1pm on weekends: On Call pager [phone]

## 2021-11-23 NOTE — Progress Notes (Signed)
°  Progress Note   Date: 11/14/2021  Patient Name: Sergio Chavez        MRN#: [id number]  Review the patients clinical findings supports the diagnosis of:   Hyponatremia , mild and not clinically significant at this time.

## 2021-11-23 NOTE — Progress Notes (Signed)
Patient seen by Dr. Agustin Cree, will order nicotine patch.  Patient has pulled the leads of the heart monitor off, refused being placed back on.  Per Dr. Agustin Cree, it is ok to keep the heart monitor and pulse oximeter off for the meantime.

## 2021-11-23 NOTE — Progress Notes (Addendum)
Patient has been restless, agitated, and confused.  Patient's daughter has been assisting with redirecting the patient but the patient started trying to climb out of bed, tearing SCD leg cuffs off and pulling on heart monitor.  Patient has pulled off pulse oximeter x 2, will keep off at this time.  On call MD, Dr. Agustin Cree, made aware, will come to bedside to assess patient.

## 2021-11-24 DIAGNOSIS — E559 Vitamin D deficiency, unspecified: Secondary | ICD-10-CM | POA: Diagnosis not present

## 2021-11-24 DIAGNOSIS — S72009A Fracture of unspecified part of neck of unspecified femur, initial encounter for closed fracture: Secondary | ICD-10-CM

## 2021-11-24 DIAGNOSIS — I716 Thoracoabdominal aortic aneurysm, without rupture, unspecified: Secondary | ICD-10-CM | POA: Diagnosis not present

## 2021-11-24 DIAGNOSIS — Z720 Tobacco use: Secondary | ICD-10-CM

## 2021-11-24 DIAGNOSIS — S22058A Other fracture of T5-T6 vertebra, initial encounter for closed fracture: Secondary | ICD-10-CM

## 2021-11-24 LAB — BASIC METABOLIC PANEL
Anion gap: 11 (ref 5–15)
BUN: 17 mg/dL (ref 8–23)
CO2: 25 mmol/L (ref 22–32)
Calcium: 9.5 mg/dL (ref 8.9–10.3)
Chloride: 98 mmol/L (ref 98–111)
Creatinine, Ser: 0.85 mg/dL (ref 0.61–1.24)
GFR, Estimated: >60 mL/min (ref >60)
Glucose, Bld: 89 mg/dL (ref 70–99)
Potassium: 3.9 mmol/L (ref 3.5–5.1)
Sodium: 134 mmol/L — ABNORMAL LOW (ref 135–145)

## 2021-11-24 LAB — CBC
HCT: 38.3 % — ABNORMAL LOW (ref 39.0–52.0)
Hemoglobin: 12.8 g/dL — ABNORMAL LOW (ref 13.0–17.0)
MCH: 29 pg (ref 26.0–34.0)
MCHC: 33.4 g/dL (ref 30.0–36.0)
MCV: 86.8 fL (ref 80.0–100.0)
Platelets: 238 10*3/uL (ref 150–400)
RBC: 4.41 MIL/uL (ref 4.22–5.81)
RDW: 14.2 % (ref 11.5–15.5)
WBC: 6 10*3/uL (ref 4.0–10.5)
nRBC: 0 % (ref 0.0–0.2)

## 2021-11-24 MED ORDER — TRAZODONE HCL 50 MG PO TABS
25.0000 mg | ORAL_TABLET | Freq: Every day | ORAL | Status: DC
  Administered 2021-11-24: 21:00:00 25 mg via ORAL
  Filled 2021-11-24: qty 1

## 2021-11-24 MED ORDER — HYDRALAZINE HCL 10 MG PO TABS
10.0000 mg | ORAL_TABLET | Freq: Once | ORAL | Status: AC
  Administered 2021-11-24: 10 mg via ORAL
  Filled 2021-11-24: qty 1

## 2021-11-24 MED ORDER — SENNOSIDES-DOCUSATE SODIUM 8.6-50 MG PO TABS
2.0000 | ORAL_TABLET | Freq: Every day | ORAL | Status: DC
  Administered 2021-11-25: 2 via ORAL
  Filled 2021-11-24: qty 2

## 2021-11-24 MED ORDER — OXYCODONE HCL 5 MG PO TABS
2.5000 mg | ORAL_TABLET | ORAL | Status: DC | PRN
  Administered 2021-11-24 – 2021-11-27 (×6): 2.5 mg via ORAL
  Filled 2021-11-24 (×6): qty 1

## 2021-11-24 MED ORDER — BISACODYL 5 MG PO TBEC
5.0000 mg | DELAYED_RELEASE_TABLET | Freq: Once | ORAL | Status: AC
  Administered 2021-11-24: 5 mg via ORAL
  Filled 2021-11-24: qty 1

## 2021-11-24 MED ORDER — HYDRALAZINE HCL 10 MG PO TABS
10.0000 mg | ORAL_TABLET | Freq: Three times a day (TID) | ORAL | Status: DC
  Administered 2021-11-24 – 2021-11-26 (×8): 10 mg via ORAL
  Filled 2021-11-24 (×9): qty 1

## 2021-11-24 NOTE — Progress Notes (Signed)
Subjective: Overnight events: none   Pt is seen at bedside during rounds today. His daughter is at bedside with him. Pt is happy appearing and pleasant this morning. He is interactive and aware of surgery today. All questions and concerns are answered for today.   Objective:  Vital signs in last 24 hours: Vitals:   11/24/21 0930 11/24/21 1126 11/24/21 1310 11/24/21 1313  BP:  136/77 (!) 196/103 (!) 194/110  Pulse:  76 89   Resp:  17    Temp: 98.3 F (36.8 C) 97.6 F (36.4 C)    TempSrc: Oral Oral    SpO2:  96% 96%   Weight:      Height:       Constitutional: chronically ill-appearing, anxious but interactive, and in NAD Cardiovascular: RRR, no m/r/g, non-edematous bilateral LE Pulmonary/Chest: normal work of breathing on supplemental O2, LCTAB Abdominal: non-distended, soft, non-TTP Neurological: more oriented today, but remains difficult to follow instructions   Skin: warm and dry, some bruises along left arm and right hand, no skin breakdown of heels.   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 Plan right hip pinning today at 315p. Optimizing pain regimen and continuing to control his BP (see below).  - Ortho following, plan for surgical pinning today - Advance diet after surgery  - Palliative care following, appreciate assistance; remains full code at this time - Scheduled  tylenol 1g TID  - Oxycodone IR 2.5 mg Z3G prn added  - Licocine patch for back and hip  - Senna-docusate and Mirilax   - Dulcolax suppository on board   - Vit D 50000U weekly started - Weaning off of O2 as tolerated    - PT/OT after surgical pinning   Thoracoabdominal aortic aneurysm Large (7 cm) thrombosed infrarenal AAA without penetrating ulcer or dissection. Proceeding down palliative care route at this time. Understand risks of not operating, including the risk of rupture. - Palliative care following, appreciate assistance   Severe chronic asymptomatic hypertension Hypertensive SBP 180's yesterday (asymptomatic) but 160's this morning. Will continue current regimen, and adjust BP meds after surgery given large AAA.  - Increase to amlodipine 5 to 10 mg daily  - Continue Hydralazine 10 mg TID  - Starting Lisinopril 20 mg tomorrow given resolution of AKI   Hospital delirium   Ongoing. Pulling at lines and tubes in the evening despite trazodone, but pt is redirectable with the help of family. Will work on minimizing tubes and lines. Is more alter and interactive this morning.  - Started Seroquel 25 mg at bedtime  - DC Trazodone 25 mg at bedtime   AKI, resolved  Hyponatremia   Cr stable 0.85 -> 0.82 after IVF and discontinuing Lisinopril. Hyponatremia in s/o poor po intake stable/improved with IVF and encouraged oral intake.  - Encourage PO intake  - Avoid nephrotoxins  - Discontinued Lisinopril  - Trend BMP   Possible Upper GI bleed vs gastritis  Possible coffee  ground emesis several nights ago with new AKI and elevated BUN. GI consulted; offered EGD workup refused. No indication that he is losing blood at this time. Trending CBC's, and Hgb stable at this time.  - GI signed off - Continue IV Protonix 40 mg BID, transition to PO Protonix on 2/17 - Trend CBC and transfuse for Hgb <7 or hemodynamic instability    HLD - Continue Pravastatin 40 mg daily    Localized squamous cell  carcinoma of vocal cords (2012) Tobacco use disorder  Incidental irregular left upper lobe nodule  S/p chem and radiation. Smoking cessation has been encouraged in the past, but not interested in quitting.  1 cm incidental irregular nodule concerning for neoplasm found on CTA this admission.  - Nicotine patches  - Palliative care following, appreciate assistance  - Possible outpatient follow up for lung biopsy  Incidental CTA findings  Numerous hepatic cysts, and additional too small to characterize hypodensities in the left kidney. - Continue with outpatient follow up     Best Practice: Diet: Dysphagia 3 diet   IVF: None VTE: SCDs Code: Full   Lajean Manes, MD  Internal Medicine Resident, PGY-1 Pager: [phone] After 5pm on weekdays and 1pm on weekends: On Call pager [phone]
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
## 2021-11-24 NOTE — Progress Notes (Signed)
°   11/24/21 0224  Vitals  BP (!) 188/105 Lily Kocher, MD made aware. new order for Hydralazine 10mg  PO-administered.)  MAP (mmHg) 126  BP Location Right Arm  BP Method Automatic  Patient Position (if appropriate) Lying  MEWS COLOR  MEWS Score Color Green  MEWS Score  MEWS Temp 0  MEWS Systolic 0  MEWS Pulse 0  MEWS RR 0  MEWS LOC 0  MEWS Score 0   0404 repeat BP is 159/96

## 2021-11-24 NOTE — Progress Notes (Signed)
This chaplain is present for F/U spiritual care. The Pt. preferred name is "Sergio Chavez".  The Pt. daughters Neoma Laming and Arrie Aran are at the bedside.  The Pt. presents himself to the chaplain with a little bit of humor today. The chaplain understands from the Pt. daughters "rest" will be important for the Pt. because of the sequence of the events earlier today.   The chaplain understands the Pt. son in law-Steve will spend the night at the hospital tonight, to allow [place] to go home. Both daughters shared the importance of chaplain prayer with the Pt. before surgery tomorrow.   The chaplain offered a listening presence for the daughters as the family navigates the Pt. next steps.  The chaplain understands the family anticipates the Pt. returning to [facility] for rehab.  This is available for F/U spiritual care as needed.  Chaplain Sallyanne Kuster [phone]

## 2021-11-24 NOTE — Progress Notes (Signed)
Patient ID: Sergio Chavez, male   DOB: 1940-10-29, 81 y.o.   MRN: [id number]   After many talks with pt/family they have decided to proceed with hip pinning despite risks. Plan for tomorrow afternoon with Dr. Lyla Glassing. Please keep NPO after MN.    Lisette Abu, PA-C Orthopedic Surgery [phone]

## 2021-11-25 ENCOUNTER — Inpatient Hospital Stay (HOSPITAL_COMMUNITY): Payer: Medicare Other | Admitting: Anesthesiology

## 2021-11-25 ENCOUNTER — Inpatient Hospital Stay (HOSPITAL_COMMUNITY): Payer: Medicare Other

## 2021-11-25 ENCOUNTER — Other Ambulatory Visit: Payer: Self-pay

## 2021-11-25 ENCOUNTER — Encounter (HOSPITAL_COMMUNITY): Payer: Self-pay | Admitting: Internal Medicine

## 2021-11-25 ENCOUNTER — Encounter (HOSPITAL_COMMUNITY): Admission: EM | Disposition: E | Payer: Self-pay | Source: Home / Self Care | Attending: Internal Medicine

## 2021-11-25 DIAGNOSIS — S72001A Fracture of unspecified part of neck of right femur, initial encounter for closed fracture: Secondary | ICD-10-CM

## 2021-11-25 DIAGNOSIS — Z96642 Presence of left artificial hip joint: Secondary | ICD-10-CM | POA: Diagnosis not present

## 2021-11-25 DIAGNOSIS — R410 Disorientation, unspecified: Secondary | ICD-10-CM | POA: Diagnosis not present

## 2021-11-25 DIAGNOSIS — I10 Essential (primary) hypertension: Secondary | ICD-10-CM

## 2021-11-25 DIAGNOSIS — S72009A Fracture of unspecified part of neck of unspecified femur, initial encounter for closed fracture: Secondary | ICD-10-CM | POA: Diagnosis not present

## 2021-11-25 DIAGNOSIS — I739 Peripheral vascular disease, unspecified: Secondary | ICD-10-CM

## 2021-11-25 DIAGNOSIS — J449 Chronic obstructive pulmonary disease, unspecified: Secondary | ICD-10-CM

## 2021-11-25 DIAGNOSIS — I716 Thoracoabdominal aortic aneurysm, without rupture, unspecified: Secondary | ICD-10-CM | POA: Diagnosis not present

## 2021-11-25 HISTORY — PX: HIP PINNING,CANNULATED: SHX1758

## 2021-11-25 LAB — CBC
HCT: 38.6 % — ABNORMAL LOW (ref 39.0–52.0)
Hemoglobin: 12.9 g/dL — ABNORMAL LOW (ref 13.0–17.0)
MCH: 28.6 pg (ref 26.0–34.0)
MCHC: 33.4 g/dL (ref 30.0–36.0)
MCV: 85.6 fL (ref 80.0–100.0)
Platelets: 266 10*3/uL (ref 150–400)
RBC: 4.51 MIL/uL (ref 4.22–5.81)
RDW: 14 % (ref 11.5–15.5)
WBC: 5.8 10*3/uL (ref 4.0–10.5)
nRBC: 0 % (ref 0.0–0.2)

## 2021-11-25 LAB — BASIC METABOLIC PANEL
Anion gap: 12 (ref 5–15)
BUN: 16 mg/dL (ref 8–23)
CO2: 24 mmol/L (ref 22–32)
Calcium: 9.4 mg/dL (ref 8.9–10.3)
Chloride: 96 mmol/L — ABNORMAL LOW (ref 98–111)
Creatinine, Ser: 0.82 mg/dL (ref 0.61–1.24)
GFR, Estimated: >60 mL/min (ref >60)
Glucose, Bld: 125 mg/dL — ABNORMAL HIGH (ref 70–99)
Potassium: 3.4 mmol/L — ABNORMAL LOW (ref 3.5–5.1)
Sodium: 132 mmol/L — ABNORMAL LOW (ref 135–145)

## 2021-11-25 SURGERY — FIXATION, FEMUR, NECK, PERCUTANEOUS, USING SCREW
Anesthesia: Spinal | Site: Hip | Laterality: Right

## 2021-11-25 MED ORDER — DOCUSATE SODIUM 100 MG PO CAPS
100.0000 mg | ORAL_CAPSULE | Freq: Two times a day (BID) | ORAL | Status: DC
  Administered 2021-11-25 – 2021-11-26 (×3): 100 mg via ORAL
  Filled 2021-11-25 (×4): qty 1

## 2021-11-25 MED ORDER — KETAMINE HCL-SODIUM CHLORIDE 100-0.9 MG/10ML-% IV SOSY
PREFILLED_SYRINGE | INTRAVENOUS | Status: DC | PRN
  Administered 2021-11-25 (×2): 10 mg via INTRAVENOUS

## 2021-11-25 MED ORDER — ASPIRIN EC 325 MG PO TBEC
325.0000 mg | DELAYED_RELEASE_TABLET | Freq: Every day | ORAL | Status: DC
  Administered 2021-11-26: 325 mg via ORAL
  Filled 2021-11-25 (×2): qty 1

## 2021-11-25 MED ORDER — MENTHOL 3 MG MT LOZG: 1.0000 | LOZENGE | OROMUCOSAL | Status: DC | PRN

## 2021-11-25 MED ORDER — QUETIAPINE FUMARATE 50 MG PO TABS
25.0000 mg | ORAL_TABLET | Freq: Every day | ORAL | Status: DC
  Administered 2021-11-25: 25 mg via ORAL
  Filled 2021-11-25: qty 1

## 2021-11-25 MED ORDER — 0.9 % SODIUM CHLORIDE (POUR BTL) OPTIME
TOPICAL | Status: DC | PRN
  Administered 2021-11-25: 1000 mL

## 2021-11-25 MED ORDER — ORAL CARE MOUTH RINSE: 15.0000 mL | Freq: Once | OROMUCOSAL | Status: AC

## 2021-11-25 MED ORDER — TRANEXAMIC ACID-NACL 1000-0.7 MG/100ML-% IV SOLN
1000.0000 mg | Freq: Once | INTRAVENOUS | Status: AC
  Administered 2021-11-25: 1000 mg via INTRAVENOUS
  Filled 2021-11-25 (×2): qty 100

## 2021-11-25 MED ORDER — CEFAZOLIN SODIUM-DEXTROSE 2-4 GM/100ML-% IV SOLN
INTRAVENOUS | Status: AC
  Filled 2021-11-25: qty 100

## 2021-11-25 MED ORDER — PHENOL 1.4 % MT LIQD: 1.0000 | OROMUCOSAL | Status: DC | PRN

## 2021-11-25 MED ORDER — METOCLOPRAMIDE HCL 5 MG/ML IJ SOLN: 5.0000 mg | Freq: Three times a day (TID) | INTRAMUSCULAR | Status: DC | PRN

## 2021-11-25 MED ORDER — LISINOPRIL 20 MG PO TABS
20.0000 mg | ORAL_TABLET | Freq: Every day | ORAL | Status: DC
  Administered 2021-11-26: 20 mg via ORAL
  Filled 2021-11-25 (×2): qty 1

## 2021-11-25 MED ORDER — FENTANYL CITRATE (PF) 100 MCG/2ML IJ SOLN: 25.0000 ug | INTRAMUSCULAR | Status: DC | PRN

## 2021-11-25 MED ORDER — METOCLOPRAMIDE HCL 5 MG PO TABS: 5.0000 mg | ORAL_TABLET | Freq: Three times a day (TID) | ORAL | Status: DC | PRN

## 2021-11-25 MED ORDER — ONDANSETRON HCL 4 MG PO TABS
4.0000 mg | ORAL_TABLET | Freq: Four times a day (QID) | ORAL | Status: DC | PRN
Start: 2021-11-25 — End: 2021-11-29

## 2021-11-25 MED ORDER — PHENYLEPHRINE 40 MCG/ML (10ML) SYRINGE FOR IV PUSH (FOR BLOOD PRESSURE SUPPORT)
PREFILLED_SYRINGE | INTRAVENOUS | Status: DC | PRN
  Administered 2021-11-25: 80 ug via INTRAVENOUS

## 2021-11-25 MED ORDER — CEFAZOLIN SODIUM-DEXTROSE 2-4 GM/100ML-% IV SOLN
2.0000 g | INTRAVENOUS | Status: AC
  Administered 2021-11-25: 2 g via INTRAVENOUS

## 2021-11-25 MED ORDER — KETAMINE HCL 50 MG/5ML IJ SOSY
PREFILLED_SYRINGE | INTRAMUSCULAR | Status: AC
  Filled 2021-11-25: qty 5

## 2021-11-25 MED ORDER — ONDANSETRON HCL 4 MG/2ML IJ SOLN
4.0000 mg | Freq: Four times a day (QID) | INTRAMUSCULAR | Status: DC | PRN
Start: 2021-11-25 — End: 2021-11-29

## 2021-11-25 MED ORDER — BUPIVACAINE IN DEXTROSE 0.75-8.25 % IT SOLN
INTRATHECAL | Status: DC | PRN
  Administered 2021-11-25: 12 mg via INTRATHECAL

## 2021-11-25 MED ORDER — CHLORHEXIDINE GLUCONATE 0.12 % MT SOLN
OROMUCOSAL | Status: AC
  Administered 2021-11-25: 15 mL via OROMUCOSAL
  Filled 2021-11-25: qty 15

## 2021-11-25 MED ORDER — PHENYLEPHRINE HCL-NACL 20-0.9 MG/250ML-% IV SOLN
INTRAVENOUS | Status: DC | PRN
Start: 2021-11-25 — End: 2021-11-25
  Administered 2021-11-25: 30 ug/min via INTRAVENOUS

## 2021-11-25 MED ORDER — CEFAZOLIN SODIUM-DEXTROSE 2-4 GM/100ML-% IV SOLN
2.0000 g | Freq: Four times a day (QID) | INTRAVENOUS | Status: AC
  Administered 2021-11-25 – 2021-11-26 (×2): 2 g via INTRAVENOUS
  Filled 2021-11-25 (×2): qty 100

## 2021-11-25 MED ORDER — PROPOFOL 500 MG/50ML IV EMUL
INTRAVENOUS | Status: DC | PRN
  Administered 2021-11-25: 25 ug/kg/min via INTRAVENOUS

## 2021-11-25 MED ORDER — POVIDONE-IODINE 10 % EX SWAB
2.0000 "application " | Freq: Once | CUTANEOUS | Status: AC
  Administered 2021-11-25: 2 via TOPICAL

## 2021-11-25 MED ORDER — SENNA 8.6 MG PO TABS
1.0000 | ORAL_TABLET | Freq: Two times a day (BID) | ORAL | Status: DC
  Administered 2021-11-25 – 2021-11-26 (×3): 8.6 mg via ORAL
  Filled 2021-11-25 (×4): qty 1

## 2021-11-25 MED ORDER — AMLODIPINE BESYLATE 10 MG PO TABS
10.0000 mg | ORAL_TABLET | Freq: Every day | ORAL | Status: DC
  Administered 2021-11-26: 10 mg via ORAL
  Filled 2021-11-25 (×2): qty 1

## 2021-11-25 MED ORDER — CHLORHEXIDINE GLUCONATE 0.12 % MT SOLN: 15.0000 mL | Freq: Once | OROMUCOSAL | Status: AC

## 2021-11-25 MED ORDER — LACTATED RINGERS IV SOLN: INTRAVENOUS | Status: DC

## 2021-11-25 SURGICAL SUPPLY — 39 items
ADH SKN CLS APL DERMABOND .7 (GAUZE/BANDAGES/DRESSINGS) ×1
APL PRP STRL LF DISP 70% ISPRP (MISCELLANEOUS) ×1
BAG COUNTER SPONGE SURGICOUNT (BAG) ×3 IMPLANT
BAG SPNG CNTER NS LX DISP (BAG) ×1
BIT DRILL 4.8X300 (BIT) ×1 IMPLANT
CHLORAPREP W/TINT 26 (MISCELLANEOUS) ×3 IMPLANT
COVER PERINEAL POST (MISCELLANEOUS) ×3 IMPLANT
COVER SURGICAL LIGHT HANDLE (MISCELLANEOUS) ×3 IMPLANT
DERMABOND ADVANCED (GAUZE/BANDAGES/DRESSINGS) ×1
DERMABOND ADVANCED .7 DNX12 (GAUZE/BANDAGES/DRESSINGS) ×2 IMPLANT
DRAPE C-ARM 42X72 X-RAY (DRAPES) ×3 IMPLANT
DRAPE C-ARMOR (DRAPES) ×3 IMPLANT
DRAPE IMP U-DRAPE 54X76 (DRAPES) ×6 IMPLANT
DRAPE SHEET LG 3/4 BI-LAMINATE (DRAPES) ×1 IMPLANT
DRAPE STERI IOBAN 125X83 (DRAPES) ×3 IMPLANT
DRAPE U-SHAPE 47X51 STRL (DRAPES) ×6 IMPLANT
DRSG AQUACEL AG ADV 3.5X 4 (GAUZE/BANDAGES/DRESSINGS) ×3 IMPLANT
ELECT REM PT RETURN 9FT ADLT (ELECTROSURGICAL) ×2
ELECTRODE REM PT RTRN 9FT ADLT (ELECTROSURGICAL) ×2 IMPLANT
GLOVE SURG ENC MOIS LTX SZ8.5 (GLOVE) ×3 IMPLANT
GLOVE SURG ENC TEXT LTX SZ7 (GLOVE) ×3 IMPLANT
GLOVE SURG UNDER POLY LF SZ7.5 (GLOVE) ×3 IMPLANT
GLOVE SURG UNDER POLY LF SZ8.5 (GLOVE) ×3 IMPLANT
GOWN STRL REUS W/ TWL LRG LVL3 (GOWN DISPOSABLE) ×2 IMPLANT
GOWN STRL REUS W/ TWL XL LVL3 (GOWN DISPOSABLE) ×2 IMPLANT
GOWN STRL REUS W/TWL 2XL LVL3 (GOWN DISPOSABLE) ×3 IMPLANT
GOWN STRL REUS W/TWL LRG LVL3 (GOWN DISPOSABLE) ×2
GOWN STRL REUS W/TWL XL LVL3 (GOWN DISPOSABLE) ×2
KIT TURNOVER KIT B (KITS) ×3 IMPLANT
MARKER SKIN DUAL TIP RULER LAB (MISCELLANEOUS) ×3 IMPLANT
NS IRRIG 1000ML POUR BTL (IV SOLUTION) ×3 IMPLANT
PACK GENERAL/GYN (CUSTOM PROCEDURE TRAY) ×3 IMPLANT
PAD ARMBOARD 7.5X6 YLW CONV (MISCELLANEOUS) ×6 IMPLANT
SCREW PARTIAL THREAD 8.0X90MM (Screw) ×3 IMPLANT
STAPLER VISISTAT 35W (STAPLE) ×1 IMPLANT
SUT MNCRL AB 3-0 PS2 27 (SUTURE) ×3 IMPLANT
SUT MON AB 2-0 CT1 27 (SUTURE) ×3 IMPLANT
TOWEL GREEN STERILE (TOWEL DISPOSABLE) ×3 IMPLANT
WIRE GUIDE DRILL TIP 12 2.4MM (WIRE) ×3 IMPLANT

## 2021-11-25 NOTE — Discharge Instructions (Addendum)
 Dr. Brian Swinteck Adult Hip & Knee Specialist [facility] [address] [place], [place] [address] [phone]   POSTOPERATIVE DIRECTIONS    Hip Rehabilitation, Guidelines Following Surgery   WEIGHT BEARING Weight bearing as tolerated with assist device (walker, cane, etc) as directed, use it as long as suggested by your surgeon or therapist, typically at least 4-6 weeks.   HOME CARE INSTRUCTIONS  Remove items at home which could result in a fall. This includes throw rugs or furniture in walking pathways.  Continue medications as instructed at time of discharge.  You may have some home medications which will be placed on hold until you complete the course of blood thinner medication.  4 days after discharge, you may start showering. No tub baths or soaking your incisions. Do not put on socks or shoes without following the instructions of your caregivers.   Sit on chairs with arms. Use the chair arms to help push yourself up when arising.  Arrange for the use of a toilet seat elevator so you are not sitting low.   Walk with walker as instructed.  You may resume a sexual relationship in one month or when given the OK by your caregiver.  Use walker as long as suggested by your caregivers.  Avoid periods of inactivity such as sitting longer than an hour when not asleep. This helps prevent blood clots.  You may return to work once you are cleared by your surgeon.  Do not drive a car for 6 weeks or until released by your surgeon.  Do not drive while taking narcotics.  Wear elastic stockings for two weeks following surgery during the day but you may remove then at night.  Make sure you keep all of your appointments after your operation with all of your doctors and caregivers. You should call the office at the above phone number and make an appointment for approximately two weeks after the date of your surgery. Please pick up a stool softener and laxative  for home use as long as you are requiring pain medications.  ICE to the affected hip every three hours for 30 minutes at a time and then as needed for pain and swelling. Continue to use ice on the hip for pain and swelling from surgery. You may notice swelling that will progress down to the foot and ankle.  This is normal after surgery.  Elevate the leg when you are not up walking on it.   It is important for you to complete the blood thinner medication as prescribed by your doctor.  Continue to use the breathing machine which will help keep your temperature down.  It is common for your temperature to cycle up and down following surgery, especially at night when you are not up moving around and exerting yourself.  The breathing machine keeps your lungs expanded and your temperature down.  RANGE OF MOTION AND STRENGTHENING EXERCISES  These exercises are designed to help you keep full movement of your hip joint. Follow your caregiver's or physical therapist's instructions. Perform all exercises about fifteen times, three times per day or as directed. Exercise both hips, even if you have had only one joint replacement. These exercises can be done on a training (exercise) mat, on the floor, on a table or on a bed. Use whatever works the best and is most comfortable for you. Use music or television while you are exercising so that the exercises are a pleasant break in your day. This   will make your life better with the exercises acting as a break in routine you can look forward to.  Lying on your back, slowly slide your foot toward your buttocks, raising your knee up off the floor. Then slowly slide your foot back down until your leg is straight again.  Lying on your back spread your legs as far apart as you can without causing discomfort.  Lying on your side, raise your upper leg and foot straight up from the floor as far as is comfortable. Slowly lower the leg and repeat.  Lying on your back, tighten up the  muscle in the front of your thigh (quadriceps muscles). You can do this by keeping your leg straight and trying to raise your heel off the floor. This helps strengthen the largest muscle supporting your knee.  Lying on your back, tighten up the muscles of your buttocks both with the legs straight and with the knee bent at a comfortable angle while keeping your heel on the floor.   SKILLED REHAB INSTRUCTIONS: If the patient is transferred to a skilled rehab facility following release from the hospital, a list of the current medications will be sent to the facility for the patient to continue.  When discharged from the skilled rehab facility, please have the facility set up the patient's Home Health Physical Therapy prior to being released. Also, the skilled facility will be responsible for providing the patient with their medications at time of release from the facility to include their pain medication and their blood thinner medication. If the patient is still at the rehab facility at time of the two week follow up appointment, the skilled rehab facility will also need to assist the patient in arranging follow up appointment in our office and any transportation needs.  MAKE SURE YOU:  Understand these instructions.  Will watch your condition.  Will get help right away if you are not doing well or get worse.  Pick up stool softner and laxative for home use following surgery while on pain medications. Daily dry dressing changes as needed. In 4 days, you may remove your dressings and begin taking showers - no tub baths or soaking the incisions. Continue to use ice for pain and swelling after surgery. Do not use any lotions or creams on the incision until instructed by your surgeon.   

## 2021-11-25 NOTE — Anesthesia Preprocedure Evaluation (Addendum)
Anesthesia Evaluation  Patient identified by MRN, date of birth, ID band Patient awake and Patient confused    Reviewed: Allergy & Precautions, NPO status , Patient's Chart, lab work & pertinent test results  History of Anesthesia Complications Negative for: history of anesthetic complications  Airway Mallampati: II  TM Distance: >3 FB Neck ROM: Full    Dental  (+) Chipped, Dental Advisory Given, Poor Dentition   Pulmonary COPD,  COPD inhaler, Current Smoker and Patient abstained from smoking.,    breath sounds clear to auscultation       Cardiovascular hypertension, Pt. 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)   Rhythm:Regular Rate:Normal     Neuro/Psych    GI/Hepatic Neg liver ROS, GERD  Controlled,  Endo/Other  negative endocrine ROS  Renal/GU negative Renal ROS     Musculoskeletal   Abdominal   Peds  Hematology negative hematology ROS (+)   Anesthesia Other Findings H/o throat cancer  Reproductive/Obstetrics                            Anesthesia Physical Anesthesia Plan  ASA: 3  Anesthesia Plan: Spinal   Post-op Pain Management: Tylenol PO (pre-op)*   Induction:   PONV Risk Score and Plan: 1 and Ondansetron and Treatment may vary due to age or medical condition  Airway Management Planned: Natural Airway and Simple Face Mask  Additional Equipment: None  Intra-op Plan:   Post-operative Plan:   Informed Consent: I have reviewed the patients History and Physical, chart, labs and discussed the procedure including the risks, benefits and alternatives for the proposed anesthesia with the patient or authorized representative who has indicated his/her understanding and acceptance.     Dental advisory given and Consent reviewed with POA  Plan Discussed with: CRNA and Surgeon  Anesthesia Plan Comments:        Anesthesia Quick Evaluation

## 2021-11-25 NOTE — Op Note (Signed)
OPERATIVE REPORT  SURGEON: Rod Can, MD   ASSISTANT: Staff.  PREOPERATIVE DIAGNOSIS: Right femoral neck fracture.   POSTOPERATIVE DIAGNOSIS: Right femoral neck fracture.   PROCEDURE: Percutaneous screw fixation, Right femoral neck.   IMPLANTS: Biomet 8.0 mm cannulated screws 3.  ANESTHESIA:  Spinal  ESTIMATED BLOOD LOSS: Minimal    ANTIBIOTICS: 2 g Ancef.  DRAINS: None.  COMPLICATIONS: None.   CONDITION: PACU - hemodynamically stable.Marland Kitchen   BRIEF CLINICAL NOTE: Sergio Chavez is a 81 y.o. male who presented with a femoral neck fracture. The patient was admitted to the hospitalist service and underwent perioperative risk stratification and medical optimization. The risks, benefits, and alternatives to the procedure were explained, and the patient elected to proceed.  PROCEDURE IN DETAIL: Surgical site was marked by myself. The patient was taken to the operating room and anesthesia was induced on the bed. The patient was then transferred to the [facility] table and the nonoperative lower extremity was scissored underneath the operative side. The hip was prepped and draped in the normal sterile surgical fashion. Timeout was called verifying side and site of surgery. Preop antibiotics were given with 60 minutes of beginning the procedure.  Fluoroscopy was used to define the patient's anatomy. A 2 cm incision was made over the lateral aspect of the proximal femur. Guidepin was placed inferiorly on the AP x-ray, and centrally on the lateral x-ray. 2 additional guide pins were placed proximal to the first pin, one anterior and one posterior. Pin position was confirmed with biplanar fluoroscopy. The pins were sequentially measured, the near cortex was drilled, and the screws were placed. Final AP and lateral fluoroscopy views were obtained to confirm fracture reduction and hardware placement. There was no chondral penetration.  The wounds were copiously irrigated with saline. The wound was  closed in layers with #1 Vicryl for the fascia, 2-0 Monocryl for the deep dermal layer, and skin staples. Glue was applied to the skin. Once the glue was fully hardened, sterile dressing was applied. The patient was then awakened from anesthesia and taken to the PACU in stable condition. Sponge needle and instrument counts were correct at the end of the case 2. There were no known complications.  We will readmit the patient to the hospitalist. Weightbearing status will be weightbearing as tolerated with a walker. We will begin ASA for DVT prophylaxis. The patient will mobilize with physical therapy and undergo disposition planning.

## 2021-11-25 NOTE — Progress Notes (Signed)
Pt very confused this morning. Did not get a lot of sleep overnight. Agitated. Keep pulling [place] off and wouldn't keep it on. Family at bedside very concern.  Made provider on call aware. No new order at this time. Reposition pt. Bed on lowest position. Bed alarm is on. Will continue to closely monitor pt.

## 2021-11-25 NOTE — Transfer of Care (Signed)
Immediate Anesthesia Transfer of Care Note  Patient: Sergio Chavez  Procedure(s) Performed: RIGHT CANNULATED HIP PINNING (Right: Hip)  Patient Location: PACU  Anesthesia Type:MAC and Spinal  Level of Consciousness: drowsy  Airway & Oxygen Therapy: Patient Spontanous Breathing and Patient connected to face mask oxygen  Post-op Assessment: Report given to RN and Post -op Vital signs reviewed and stable  Post vital signs: Reviewed and stable  Last Vitals:  Vitals Value Taken Time  BP 95/60 11/15/2021 1651  Temp    Pulse 83 11/19/2021 1651  Resp 15 11/14/2021 1651  SpO2 95 % 11/15/2021 1651  Vitals shown include unvalidated device data.  Last Pain:  Vitals:   11/17/2021 1503  TempSrc:   PainSc: 0-No pain      Patients Stated Pain Goal: 0 (74/73/40 3709)  Complications: No notable events documented.

## 2021-11-25 NOTE — Anesthesia Procedure Notes (Signed)
Spinal  End time: 12/02/2021 3:57 PM Reason for block: surgical anesthesia Staffing Performed: anesthesiologist  Anesthesiologist: Annye Asa, MD Preanesthetic Checklist Completed: patient identified, IV checked, site marked, risks and benefits discussed, surgical consent, monitors and equipment checked, pre-op evaluation and timeout performed Spinal Block Patient position: sitting Prep: DuraPrep and site prepped and draped Patient monitoring: blood pressure, continuous pulse ox, cardiac monitor and heart rate Approach: midline Location: L3-4 Injection technique: single-shot Needle Needle type: Pencan and Introducer  Needle gauge: 24 G Needle length: 9 cm Assessment Events: CSF return Additional Notes Pt identified in Operating room.  Monitors applied. Working IV access confirmed. Sterile prep, drape lumbar spine.  1% lido local L 3,4.  #24ga Pencan into clear CSF L 3,4.  12mg  0.75% Bupivacaine with dextrose injected with asp CSF beginning and end of injection.  Patient asymptomatic, VSS, no heme aspirated, tolerated well.  Jenita Seashore, MD

## 2021-11-25 NOTE — Anesthesia Postprocedure Evaluation (Signed)
Anesthesia Post Note  Patient: Sergio Chavez  Procedure(s) Performed: RIGHT CANNULATED HIP PINNING (Right: Hip)     Patient location during evaluation: PACU Anesthesia Type: Spinal Level of consciousness: awake and alert, patient cooperative and confused Pain management: pain level controlled Vital Signs Assessment: post-procedure vital signs reviewed and stable Respiratory status: spontaneous breathing, nonlabored ventilation and respiratory function stable Cardiovascular status: blood pressure returned to baseline and stable Postop Assessment: no apparent nausea or vomiting and spinal receding Anesthetic complications: no   No notable events documented.  Last Vitals:  Vitals:   11/18/2021 1650 11/24/2021 1705  BP: 95/60 135/71  Pulse: 81 81  Resp: 15 14  Temp: (!) 36.2 C   SpO2: 92% 91%    Last Pain:  Vitals:   12/05/2021 1650  TempSrc:   PainSc: Asleep                 Kourtnee Lahey,E. Adreana Coull

## 2021-11-25 NOTE — Progress Notes (Signed)
This chaplain returned to the Pt. bedside for prayer before the Pt. surgery as requested by the family on the previous visit.  The Pt. declined prayer and conversation with the chaplain. The Pt. redirected the visit to his daughters. The family honored the Pt. wishes and the Pt. daughter-[name] stepped outside the room with chaplain.  The chaplain listened reflectively as Sergio Chavez tearfully described the moment that proceeded the chaplain visit. The chaplain understands the Pt. is agitated, hungry, and wants to go home. [name] continues to hope today's surgery will be a step towards the Pt. regaining strength at a SNF. [name] accepted prayer and scripture as a place of strength and peace in the moment.  Chaplain Sallyanne Kuster [phone]

## 2021-11-25 NOTE — Progress Notes (Signed)
Nutrition Follow-up  DOCUMENTATION CODES:  Severe malnutrition in context of chronic illness, Underweight  INTERVENTION:  Reinstate DYS3 diet s/p hip pinning surgery Continue Ensure Enlive po BID, each supplement provides 350 kcal and 20 grams of protein. Magic cup TID with meals, each supplement provides 290 kcal and 9 grams of protein  NUTRITION DIAGNOSIS:  Severe Malnutrition (in the context of chronic illness) related to  (inadequate oral intake) as evidenced by severe fat depletion, severe muscle depletion. - ongoing  GOAL:  Patient will meet greater than or equal to 90% of their needs - supplements in place  MONITOR:  PO intake, Supplement acceptance, Weight trends  REASON FOR ASSESSMENT:   (underweight BMI)    ASSESSMENT:  81 y.o. male with hx of HTN, hx throat cancer (2012, s/p chemo and radiation), and ongoing tobacco use presented to ED with pain after a fall at home. Imaging in ED showed a right hip fracture. AAA incidentally seen during workup.  Pt on commode at first attempted visit and out of room for surgery at second attempt. Pt has fair intake recorded since last assessment and has good intake of ensures when diet is in place.   Family decided to move forward with hip pinning. Will monitor intake after surgery to ensure that needs are being met. Pt continues to have intermittent confusion.   Average Meal Intake: 2/12-2/17: 58% intake x 3 recorded meals  Nutritionally Relevant Medications: Scheduled Meds:  bisacodyl  10 mg Rectal Once   feeding supplement  237 mL Oral BID BM   multivitamin with minerals  1 tablet Oral Daily   pantoprazole IV  40 mg Intravenous Q12H   polyethylene glycol  17 g Oral Daily   senna-docusate  2 tablet Oral Daily   Vitamin D (Ergocalciferol)  50,000 Units Oral Q7 days   PRN Meds: ondansetron  Labs Reviewed: Sodium 132, Chloride 96 Potassium 3.4 SBG ranges from 89-125 mg/dL over the last 24 hours Vitamin D 10.44  (2/12)  NUTRITION - FOCUSED PHYSICAL EXAM: Flowsheet Row Most Recent Value  Orbital Region Severe depletion  Upper Arm Region Severe depletion  Thoracic and Lumbar Region Severe depletion  Buccal Region Severe depletion  Temple Region Severe depletion  Clavicle Bone Region Moderate depletion  Clavicle and Acromion Bone Region Severe depletion  Scapular Bone Region Severe depletion  Dorsal Hand Moderate depletion  Patellar Region Severe depletion  Anterior Thigh Region Severe depletion  Posterior Calf Region Severe depletion  Edema (RD Assessment) None  Hair Reviewed  Eyes Reviewed  Mouth Reviewed  Skin Reviewed  Nails Reviewed   Diet Order:   Diet Order             Diet NPO time specified  Diet effective midnight                   EDUCATION NEEDS:  Education needs have been addressed  Skin:  Skin Assessment:  (ecchymosis to the bilateral arms)  Last BM:  2/11 (prior to admission)  Height:  Ht Readings from Last 1 Encounters:  11/19/2021 5' 8"  (1.727 m)    Weight:  Wt Readings from Last 1 Encounters:  11/15/2021 54.9 kg    Ideal Body Weight:  70 kg  BMI:  Body mass index is 18.4 kg/m.  Estimated Nutritional Needs:  Kcal:  1600-1800 kcal/d Protein:  80-90g/d Fluid:  1.7-2 L/d   Ranell Patrick, RD, LDN Clinical Dietitian RD pager # available in [facility]  After hours/weekend pager # available in  [facility]

## 2021-11-25 NOTE — Interval H&P Note (Signed)
History and Physical Interval Note:  12/01/2021 3:29 PM  Sergio Chavez  has presented today for surgery, with the diagnosis of Right Hip Fracture.  The various methods of treatment have been discussed with the patient and family. After consideration of risks, benefits and other options for treatment, the patient has consented to  Procedure(s): RIGHT CANNULATED HIP PINNING (Right) as a surgical intervention.  The patient's history has been reviewed, patient examined, no change in status, stable for surgery.  I have reviewed the patient's chart and labs.  Questions were answered to the patient's satisfaction.    The risks, benefits, and alternatives were discussed with the patient. There are risks associated with the surgery including, but not limited to, problems with anesthesia (death), infection, differences in leg length/angulation/rotation, fracture of bones, loosening or failure of implants, malunion, nonunion, hematoma (blood accumulation) which may require surgical drainage, blood clots, pulmonary embolism, nerve injury (foot drop), and blood vessel injury. The patient understands these risks and elects to proceed.    Hilton Cork Shanyah Gattuso

## 2021-11-25 NOTE — Anesthesia Procedure Notes (Signed)
Procedure Name: MAC Date/Time: 12/05/2021 3:45 PM Performed by: Griffin Dakin, CRNA Pre-anesthesia Checklist: Patient identified, Emergency Drugs available, Suction available, Patient being monitored and Timeout performed Patient Re-evaluated:Patient Re-evaluated prior to induction Oxygen Delivery Method: Simple face mask Induction Type: IV induction Placement Confirmation: positive ETCO2 and breath sounds checked- equal and bilateral Dental Injury: Teeth and Oropharynx as per pre-operative assessment

## 2021-11-26 ENCOUNTER — Inpatient Hospital Stay (HOSPITAL_COMMUNITY): Payer: Medicare Other

## 2021-11-26 DIAGNOSIS — I2699 Other pulmonary embolism without acute cor pulmonale: Secondary | ICD-10-CM | POA: Diagnosis not present

## 2021-11-26 DIAGNOSIS — S72001A Fracture of unspecified part of neck of right femur, initial encounter for closed fracture: Secondary | ICD-10-CM | POA: Diagnosis not present

## 2021-11-26 DIAGNOSIS — D62 Acute posthemorrhagic anemia: Secondary | ICD-10-CM

## 2021-11-26 DIAGNOSIS — E559 Vitamin D deficiency, unspecified: Secondary | ICD-10-CM | POA: Diagnosis not present

## 2021-11-26 DIAGNOSIS — J9601 Acute respiratory failure with hypoxia: Secondary | ICD-10-CM

## 2021-11-26 DIAGNOSIS — E871 Hypo-osmolality and hyponatremia: Secondary | ICD-10-CM

## 2021-11-26 LAB — BASIC METABOLIC PANEL
Anion gap: 12 (ref 5–15)
BUN: 16 mg/dL (ref 8–23)
CO2: 24 mmol/L (ref 22–32)
Calcium: 9.6 mg/dL (ref 8.9–10.3)
Chloride: 97 mmol/L — ABNORMAL LOW (ref 98–111)
Creatinine, Ser: 0.89 mg/dL (ref 0.61–1.24)
GFR, Estimated: >60 mL/min (ref >60)
Glucose, Bld: 113 mg/dL — ABNORMAL HIGH (ref 70–99)
Potassium: 3.2 mmol/L — ABNORMAL LOW (ref 3.5–5.1)
Sodium: 133 mmol/L — ABNORMAL LOW (ref 135–145)

## 2021-11-26 LAB — PROCALCITONIN: Procalcitonin: 0.1 ng/mL

## 2021-11-26 LAB — CBC
HCT: 35.5 % — ABNORMAL LOW (ref 39.0–52.0)
Hemoglobin: 12.2 g/dL — ABNORMAL LOW (ref 13.0–17.0)
MCH: 29.2 pg (ref 26.0–34.0)
MCHC: 34.4 g/dL (ref 30.0–36.0)
MCV: 84.9 fL (ref 80.0–100.0)
Platelets: 319 10*3/uL (ref 150–400)
RBC: 4.18 MIL/uL — ABNORMAL LOW (ref 4.22–5.81)
RDW: 13.9 % (ref 11.5–15.5)
WBC: 6.6 10*3/uL (ref 4.0–10.5)
nRBC: 0 % (ref 0.0–0.2)

## 2021-11-26 MED ORDER — IOHEXOL 350 MG/ML SOLN
75.0000 mL | Freq: Once | INTRAVENOUS | Status: AC | PRN
  Administered 2021-11-26: 75 mL via INTRAVENOUS

## 2021-11-26 MED ORDER — APIXABAN 5 MG PO TABS: 5.0000 mg | ORAL_TABLET | Freq: Two times a day (BID) | ORAL | Status: DC

## 2021-11-26 MED ORDER — QUETIAPINE FUMARATE 50 MG PO TABS
25.0000 mg | ORAL_TABLET | Freq: Every day | ORAL | Status: DC
Start: 2021-11-27 — End: 2021-11-27
  Administered 2021-11-27: 25 mg via ORAL
  Filled 2021-11-26: qty 1

## 2021-11-26 MED ORDER — ENOXAPARIN SODIUM 30 MG/0.3ML IJ SOSY
30.0000 mg | PREFILLED_SYRINGE | INTRAMUSCULAR | Status: DC
  Filled 2021-11-26: qty 0.3

## 2021-11-26 MED ORDER — APIXABAN 5 MG PO TABS
10.0000 mg | ORAL_TABLET | Freq: Two times a day (BID) | ORAL | Status: DC
Start: 2021-11-26 — End: 2021-11-27
  Administered 2021-11-26: 10 mg via ORAL
  Filled 2021-11-26 (×2): qty 2

## 2021-11-26 MED ORDER — PANTOPRAZOLE SODIUM 40 MG PO TBEC
40.0000 mg | DELAYED_RELEASE_TABLET | Freq: Two times a day (BID) | ORAL | Status: DC
  Administered 2021-11-26: 40 mg via ORAL
  Filled 2021-11-26 (×2): qty 1

## 2021-11-26 MED ORDER — POTASSIUM CHLORIDE 20 MEQ PO PACK
40.0000 meq | PACK | Freq: Two times a day (BID) | ORAL | Status: DC
Start: 2021-11-26 — End: 2021-11-27
  Administered 2021-11-26: 40 meq via ORAL
  Filled 2021-11-26 (×2): qty 2

## 2021-11-26 NOTE — Progress Notes (Signed)
Patient is restless and and agitated. Trying to get out of bed to "turn off his car". He was hearing the sound of the air mattress, writer turned off the air mattress for the mean time. Frequent reorientation needed by the patient. RP daughter is at bedside and very helpful. Repositioned patient in bed to make him comfortable. HOB on semiFowlers and 02sat 94%/3L. Will continue to close monitor patient.

## 2021-11-26 NOTE — Progress Notes (Signed)
Pt continues to be very confused agitated and restless. Daughter at bedside is very concern. VS stable. Made provider on call aware. Dr. Lorin Glass en route to see pt at bedside. Reposition pt. Bed on lowest position. Bed alarm on. Will continue to monitor pt.
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
## 2021-11-26 NOTE — Progress Notes (Signed)
Pt still very restless. Reaching out, calling out and hallucinating. Vital signs continues to remain stable. Daughter at bedside requesting pt to be evaluated again by MD at bedside. Relay message to the on call provider.

## 2021-11-26 NOTE — Consult Note (Signed)
WOC Nurse Consult Note: Reason for Consult: Deep tissue pressure injury to sacrum. Noted on 11/14/2021. Patient with numerous risk factors. Wound type:Pressure Pressure Injury POA: Yes/No/NA Measurement:6cm x 4cm purple discoloration Wound bed:N/A Drainage (amount, consistency, odor) None Periwound: intact, dry Dressing procedure/placement/frequency: I will provide guidance for the care of this area and include placement of a mattress replacement with low air loss therapy. Topical care will be with a silicone foam dressing and turning and repositioning to minimize placement in the supine position.Bilateral pressure redistribution heel boots are provided for PI prevention of the heels.  [facility] nursing team will follow along while in house, and will remain available to this patient, the nursing and medical teams.     Thanks, Maudie Flakes, MSN, RN, [place], Arther Abbott  Pager# [phone]

## 2021-11-26 NOTE — Progress Notes (Signed)
Subjective: 1 Day Post-Op Procedure(s) (LRB): RIGHT CANNULATED HIP PINNING (Right) Family and IM doc at bedside.  Pt agitated overnight.  Pt with AMS and unable to answer questions.  Objective: Vital signs in last 24 hours: Temp:  [97 F (36.1 C)-98 F (36.7 C)] 97.9 F (36.6 C) (02/18 0611) Pulse Rate:  [81-100] 100 (02/18 0611) Resp:  [14-23] 22 (02/18 0611) BP: (95-195)/(60-97) 152/90 (02/18 0611) SpO2:  [90 %-95 %] 91 % (02/18 0611)  Intake/Output from previous day: No intake/output data recorded. Intake/Output this shift: No intake/output data recorded.  Recent Labs    11/24/21 0223 11/19/2021 0745  HGB 12.8* 12.9*   Recent Labs    11/24/21 0223 12/03/2021 0745  WBC 6.0 5.8  RBC 4.41 4.51  HCT 38.3* 38.6*  PLT 238 266   Recent Labs    12/01/2021 0745 11/26/21 0757  NA 132* 133*  K 3.4* 3.2*  CL 96* 97*  CO2 24 24  BUN 16 16  CREATININE 0.82 0.89  GLUCOSE 125* 113*  CALCIUM 9.4 9.6   No results for input(s): LABPT, INR in the last 72 hours.  PE:  elderly male appearing intermittently agitated.  R hip dressing c/d/I.  R LE NVI.  No pain with internal and external rotation.   Assessment/Plan: 1 Day Post-Op Procedure(s) (LRB): RIGHT CANNULATED HIP PINNING (Right) Up with therapy  WBAT on R LE with walker.   ASA for DVT prophylaxis.     Wylene Simmer 11/26/2021, 8:49 AM

## 2021-11-26 NOTE — Progress Notes (Signed)
Per physical therapy, patient required 5L [place] to keep spo2 above 92%.Notified on-call provider of increase oxygen demand. Patient spO2 94% of 5L [place]. Dr. Eulas Post in route to evaluate patient. Will continue to monitor.

## 2021-11-26 NOTE — Progress Notes (Signed)
Subjective: Overnight events: none   Pt is seen at bedside during rounds today. His daughter is at bedside with him. Daughter states that the patient was confused overnight and this has gotten progressively worse. He denies any pain but is more confused this AM. Patient also had increased O2 requirement briefly. Unclear if this is before or after working with PT. He denies any chest pain but seems delirious.   Objective:  Vital signs in last 24 hours: Vitals:   11/26/21 0938 11/26/21 1203 11/26/21 1405 11/26/21 1718  BP: (!) 166/94 129/72  (!) 160/80  Pulse: (!) 104 93  89  Resp: 20 20  20   Temp: 97.8 F (36.6 C) 98 F (36.7 C)  97.6 F (36.4 C)  TempSrc: Oral Axillary  Axillary  SpO2: 91% 90% 96% 94%  Weight:      Height:       Constitutional: chronically ill-appearing, in NAD Cardiovascular: RRR, no m/r/g, non-edematous bilateral LE Pulmonary/Chest: normal work of breathing on supplemental O2, LCTAB Abdominal: non-distended, soft, non-TTP Neurological: Confuse  Skin: warm and dry, some bruises along left arm and right hand, no skin breakdown of heels.   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 No complaints of pain this AM though patient is delirious. He did have spinal anesthesia for his surgery. Per ortho WBAT and patient can sit up in his bed. Required oxy 2.5mg  x2.  - Ortho following - Advance diet after surgery  - Palliative care following, appreciate  assistance; remains full code at this time - Scheduled tylenol 1g TID  - Oxycodone IR 2.5 mg G6Y prn added  - Licocine patch for back and hip  - Senna-docusate and Mirilax   - Dulcolax suppository on board   - Vit D 50000U weekly started - Weaning off of O2 as tolerated    - PT/OT after surgical pinning   Increased O2 requirement Patient had increased O2 requirement from 3L to 5L. His lungs were CTAB, though exam was limited by patient's ability to cooperate. He has remained afebrile and has no leukocytosis. His procalcitonin was 0.10. CXR showed possible interstitial edema or multifocal pna. Patient has been off of pharmacological VTE ppx due to surgeries. D dimer would not be useful in this setting given his recent hip surgery.  -Will hold off of antibiotics -Could be fluid overload in the setting of aggressive hydration when patient had AKI and poor PO intake -Given his immobility and lack of pharmacological VTE ppx could also be VTE. 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 Proceeding down palliative care route at this time. Understand risks of not operating, including the risk of rupture. - Palliative care following, appreciate assistance   Severe chronic asymptomatic hypertension Hypertensive SBP 150s today. Goal <140/90 given large AAA.  - Continue amlodipine 10 mg daily  - Continue Hydralazine 10 mg TID  - Continue Lisinopril 20 mg  Hospital delirium   Ongoing. Pulling at lines and tubes in the evening despite trazodone, but pt is redirectable with the help of family. Will work on minimizing tubes and lines. Is more altered this morning.  - Continue Seroquel 25 mg at bedtime at 1900   AKI, resolved  Hyponatremia   Cr stable. May require IVFs - Encourage PO intake  - Avoid nephrotoxins  - Trend BMP   Possible Upper GI bleed vs gastritis   Possible coffee ground emesis several nights ago with new AKI and elevated BUN. GI consulted; offered EGD workup refused. Hgb stable.  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 Smoking cessation has been encouraged in the past, but not interested in quitting.  1 cm incidental irregular nodule concerning for neoplasm found on CTA this admission.  - Nicotine patches  - Palliative care following, appreciate assistance  - Possible outpatient follow up for lung biopsy  Incidental CTA findings  Numerous hepatic cysts, and additional too small to characterize hypodensities in the left kidney. - Continue with outpatient follow up     Best Practice: Diet: Dysphagia 3 diet   IVF: None VTE: SCDs Code: Full   Rick Duff, MD PGY-2 Internal Medicine  Pager [phone]  After 5pm on weekdays and 1pm on weekends: On Call pager [phone]
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
## 2021-11-27 DIAGNOSIS — R0902 Hypoxemia: Secondary | ICD-10-CM

## 2021-11-27 DIAGNOSIS — S72001A Fracture of unspecified part of neck of right femur, initial encounter for closed fracture: Secondary | ICD-10-CM | POA: Diagnosis not present

## 2021-11-27 DIAGNOSIS — J9601 Acute respiratory failure with hypoxia: Secondary | ICD-10-CM

## 2021-11-27 DIAGNOSIS — J90 Pleural effusion, not elsewhere classified: Secondary | ICD-10-CM

## 2021-11-27 DIAGNOSIS — E559 Vitamin D deficiency, unspecified: Secondary | ICD-10-CM | POA: Diagnosis not present

## 2021-11-27 DIAGNOSIS — I2699 Other pulmonary embolism without acute cor pulmonale: Secondary | ICD-10-CM | POA: Diagnosis not present

## 2021-11-27 DIAGNOSIS — D62 Acute posthemorrhagic anemia: Secondary | ICD-10-CM | POA: Diagnosis not present

## 2021-11-27 LAB — BASIC METABOLIC PANEL
Anion gap: 12 (ref 5–15)
BUN: 17 mg/dL (ref 8–23)
CO2: 23 mmol/L (ref 22–32)
Calcium: 9.5 mg/dL (ref 8.9–10.3)
Chloride: 98 mmol/L (ref 98–111)
Creatinine, Ser: 1.06 mg/dL (ref 0.61–1.24)
GFR, Estimated: >60 mL/min (ref >60)
Glucose, Bld: 97 mg/dL (ref 70–99)
Potassium: 3.3 mmol/L — ABNORMAL LOW (ref 3.5–5.1)
Sodium: 133 mmol/L — ABNORMAL LOW (ref 135–145)

## 2021-11-27 LAB — CBC
HCT: 34.9 % — ABNORMAL LOW (ref 39.0–52.0)
Hemoglobin: 11.9 g/dL — ABNORMAL LOW (ref 13.0–17.0)
MCH: 28.9 pg (ref 26.0–34.0)
MCHC: 34.1 g/dL (ref 30.0–36.0)
MCV: 84.7 fL (ref 80.0–100.0)
Platelets: 330 10*3/uL (ref 150–400)
RBC: 4.12 MIL/uL — ABNORMAL LOW (ref 4.22–5.81)
RDW: 14.1 % (ref 11.5–15.5)
WBC: 7.3 10*3/uL (ref 4.0–10.5)
nRBC: 0 % (ref 0.0–0.2)

## 2021-11-27 MED ORDER — BIOTENE DRY MOUTH MT LIQD: 15.0000 mL | OROMUCOSAL | Status: DC | PRN

## 2021-11-27 MED ORDER — GLYCOPYRROLATE 0.2 MG/ML IJ SOLN: 0.2000 mg | INTRAMUSCULAR | Status: DC | PRN

## 2021-11-27 MED ORDER — LORAZEPAM 1 MG PO TABS: 1.0000 mg | ORAL_TABLET | ORAL | Status: DC | PRN

## 2021-11-27 MED ORDER — GLYCOPYRROLATE 0.2 MG/ML IJ SOLN
0.2000 mg | INTRAMUSCULAR | Status: DC | PRN
  Administered 2021-11-27: 0.2 mg via INTRAVENOUS
  Filled 2021-11-27: qty 1

## 2021-11-27 MED ORDER — HALOPERIDOL LACTATE 5 MG/ML IJ SOLN: 0.5000 mg | INTRAMUSCULAR | Status: DC | PRN

## 2021-11-27 MED ORDER — HALOPERIDOL LACTATE 2 MG/ML PO CONC
0.5000 mg | ORAL | Status: DC | PRN
  Filled 2021-11-27: qty 0.3

## 2021-11-27 MED ORDER — GLYCOPYRROLATE 1 MG PO TABS
1.0000 mg | ORAL_TABLET | ORAL | Status: DC | PRN
  Filled 2021-11-27: qty 1

## 2021-11-27 MED ORDER — LORAZEPAM 2 MG/ML IJ SOLN
1.0000 mg | INTRAMUSCULAR | Status: DC | PRN
  Administered 2021-11-28: 1 mg via INTRAVENOUS
  Filled 2021-11-27: qty 1

## 2021-11-27 MED ORDER — MORPHINE SULFATE (PF) 2 MG/ML IV SOLN
2.0000 mg | INTRAVENOUS | Status: DC | PRN
  Administered 2021-11-27 – 2021-11-28 (×5): 2 mg via INTRAVENOUS
  Filled 2021-11-27 (×5): qty 1

## 2021-11-27 MED ORDER — POLYVINYL ALCOHOL 1.4 % OP SOLN
1.0000 [drp] | Freq: Four times a day (QID) | OPHTHALMIC | Status: DC | PRN
  Filled 2021-11-27: qty 15

## 2021-11-27 MED ORDER — MORPHINE SULFATE (PF) 2 MG/ML IV SOLN
2.0000 mg | INTRAVENOUS | Status: AC
  Administered 2021-11-27: 2 mg via INTRAVENOUS
  Filled 2021-11-27: qty 1

## 2021-11-27 MED ORDER — HALOPERIDOL 0.5 MG PO TABS
0.5000 mg | ORAL_TABLET | ORAL | Status: DC | PRN
  Filled 2021-11-27: qty 1

## 2021-11-27 MED ORDER — LORAZEPAM 2 MG/ML PO CONC: 1.0000 mg | ORAL | Status: DC | PRN

## 2021-11-27 MED ORDER — ACETAMINOPHEN 650 MG RE SUPP: 650.0000 mg | RECTAL | Status: DC | PRN

## 2021-11-27 NOTE — Progress Notes (Signed)
Subjective: Overnight events: Patient was stable on 3L [place]. He was more drowsy than usual as he was given his seroquel dose later in the night.   Pt is seen at bedside during rounds today. His daughters are at bedside with him. Patient denies any pain or trouble breathing. He remains delirious.   Objective:  Vital signs in last 24 hours: Vitals:   11/26/21 2345 11/27/21 0230 11/27/21 0435 11/27/21 0912  BP:  (!) 161/103 106/70 139/81  Pulse:  93 95 78  Resp:  20 20 20   Temp:  97.7 F (36.5 C) (!) 97.5 F (36.4 C) 97.9 F (36.6 C)  TempSrc:  Axillary Axillary Axillary  SpO2: 94% 92% 96% 90%  Weight:      Height:       Constitutional: chronically ill-appearing, in NAD Cardiovascular: RRR, no m/r/g, non-edematous bilateral LE Pulmonary/Chest: normal work of breathing on supplemental O2, LCTAB Abdominal: non-distended, soft, non-TTP Neurological: Confused Skin: warm and dry, some bruises along left arm and right hand, no skin breakdown of heels.   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 No complaints of pain this AM though patient is delirious. He did have spinal anesthesia for his surgery. Per ortho WBAT and patient can sit up in his bed. Required oxy 2.5mg  x1.  - Ortho following - Advance diet after surgery  - Palliative care following, appreciate assistance;  remains full code at this time - Scheduled tylenol 1g TID  - Oxycodone IR 2.5 mg Z6S prn added  - Licocine patch for back and hip  - Senna-docusate and Mirilax   - Dulcolax suppository on board   - Vit D 50000U weekly started - Weaning off of O2 as tolerated    - PT/OT after surgical pinning   Increased O2 requirement Patient had increased O2 requirement from 3L to 5L yesterday. He underwent a Cta chest and was noted to have PE seen within anterior right upper lobe and posteromedial lower lobe branches of the right pulmonary artery. He was also noted to have small to moderate sized partially loculated pleural effusions.   He was started on eliquis. Antibiotics were held due to patient's normal WBC and negative procalcitonin. This AM he was on 3L [place]. His exam was again limited due to inability to cooperate. His WBC count remains normal and Hgb is stable.  -Patient started on eliquis last night, will continue this -May require diuresis, had a bump in his serum creatinine likely from contrast load. Will hold on diuresis for now especially since he is back on 3L.  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 Proceeding down palliative care route at this time. Family understands risks of not operating, including the risk of rupture. - Palliative care following, appreciate assistance  Severe chronic asymptomatic hypertension Hypertensive though improved this AM. Goal <140/90 given large AAA.  - Continue amlodipine 10 mg daily  - Continue Hydralazine 10 mg TID  - Continue Lisinopril 20 mg   Hospital delirium   Ongoing. Pulling at lines and tubes in the evening despite trazodone, but pt is redirectable with the help of family. Will work on minimizing tubes and lines. Is more altered this morning.  -  Continue Seroquel 25 mg at bedtime at 1900   AKI, resolved  Hyponatremia   Cr slightly elevated in the setting of contrast load (IV) and poor oral intake.  - Encourage PO intake, when awake  - Avoid nephrotoxins  - Trend BMP   Possible Upper GI bleed vs gastritis  Possible coffee ground emesis several nights ago with new AKI and elevated BUN. GI consulted; offered EGD workup refused. Hgb stable.  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 Smoking cessation has been encouraged in the past, but not interested in quitting.  1 cm incidental irregular nodule concerning for neoplasm found on CTA this admission.  - Nicotine patches  - Palliative care following, appreciate assistance  - Possible outpatient follow up for lung biopsy  Incidental CTA findings  Numerous hepatic cysts, and additional too small to characterize hypodensities in the left kidney. - Continue with outpatient follow up     Best Practice: Diet: Dysphagia 3 diet   IVF: None VTE: SCDs Code: Full   Rick Duff, MD PGY-2 Internal Medicine  Pager [phone]  After 5pm on weekdays and 1pm on weekends: On Call pager [phone]

## 2021-11-27 NOTE — Progress Notes (Signed)
Dr. Eulas Post made aware of not being able to safely administer po medication due patient lethargic state. Observed patient arousing to verbal stimuli for a few minutes and will drift off to sleep while talking.  Per provider ok to hold a.m. po medication. Will continue to monitor patient.

## 2021-11-27 NOTE — Progress Notes (Signed)
Mobility Specialist Progress Note:   11/27/21 1552  Mobility  Activity Transferred from chair to bed  Level of Assistance Total care  Assistive Device None  Activity Response Tolerated fair  $Mobility charge 1 Mobility   Transferred pt back to bed with RN and NT.   [facility] Public librarian Phone [phone]

## 2021-11-27 NOTE — NC FL2 (Signed)
[place] LEVEL OF CARE SCREENING TOOL     IDENTIFICATION  Patient Name: Sergio Chavez Birthdate: 05/20/1941 Sex: male Admission Date (Current Location): 11/22/2021  [facility] and [place] Number:  Herbalist and Address:  [facility] [place], [place] [address] [place], [place] [address]      Provider Number: [id number]  Attending Physician Name and Address:  Lucious Groves, DO  Relative Name and Phone Number:  Apple,Deborah Daughter   [phone]    Current Level of Care: Hospital Recommended Level of Care: [place] Prior Approval Number:    Date Approved/Denied:   PASRR Number: [id number]  Discharge Plan: SNF    Current Diagnoses: Patient Active Problem List   Diagnosis Date Noted   Acute delirium    Protein-calorie malnutrition, severe 11/13/2021   Abdominal aortic aneurysm (AAA) without rupture    Advanced care planning/counseling discussion    Palliative care by specialist    Coffee ground emesis    Acute blood loss anemia    Femoral neck fracture ([place]) 11/10/2021   Tobacco use 12/10/2020   Mixed hyperlipidemia 12/10/2020   History of throat cancer 12/10/2020   S/p left hip fracture 12/10/2020   Hyponatremia 12/04/2020   Essential hypertension 12/04/2020   Hip fracture ([place]) 12/04/2020   Closed displaced fracture of right femoral neck ([place]) 12/03/2020    Orientation RESPIRATION BLADDER Height & Weight     Self  O2 Incontinent, External catheter Weight: 121 lb (54.9 kg) Height:  5\' 8"  (172.7 cm)  BEHAVIORAL SYMPTOMS/MOOD NEUROLOGICAL BOWEL NUTRITION STATUS      Continent Diet (see discharge summary)  AMBULATORY STATUS COMMUNICATION OF NEEDS Skin   Total Care Verbally Surgical wounds                       Personal Care Assistance Level of Assistance  Bathing, Feeding, Dressing, Total care Bathing Assistance: Maximum assistance Feeding assistance: Maximum assistance Dressing Assistance:  Maximum assistance Total Care Assistance: Maximum assistance   Functional Limitations Info  Sight, Hearing, Speech Sight Info: Adequate Hearing Info: Impaired Speech Info: Adequate    SPECIAL CARE FACTORS FREQUENCY  PT (By licensed PT), OT (By licensed OT)     PT Frequency: 5x week OT Frequency: 5x week            Contractures Contractures Info: Not present    Additional Factors Info  Code Status, Allergies Code Status Info: full Allergies Info: NKA           Current Medications (11/27/2021):  This is the current hospital active medication list Current Facility-Administered Medications  Medication Dose Route Frequency Provider Last Rate Last Admin   acetaminophen (TYLENOL) suppository 650 mg  650 mg Rectal Q4H PRN Rick Duff, MD       amLODipine (NORVASC) tablet 10 mg  10 mg Oral Daily Swinteck, Aaron Edelman, MD   10 mg at 11/26/21 6073   apixaban (ELIQUIS) tablet 10 mg  10 mg Oral BID Atway, Rayann N, DO   10 mg at 11/26/21 2042   Followed by   Derrill Memo ON 12/03/2021] apixaban (ELIQUIS) tablet 5 mg  5 mg Oral BID Atway, Rayann N, DO       aspirin EC tablet 325 mg  325 mg Oral Q breakfast Rod Can, MD   325 mg at 11/26/21 7106   bisacodyl (DULCOLAX) suppository 10 mg  10 mg Rectal Once Rod Can, MD       docusate  sodium (COLACE) capsule 100 mg  100 mg Oral BID Rod Can, MD   100 mg at 11/26/21 2042   feeding supplement (ENSURE ENLIVE / ENSURE PLUS) liquid 237 mL  237 mL Oral BID BM Swinteck, Aaron Edelman, MD   237 mL at 11/26/21 1433   hydrALAZINE (APRESOLINE) tablet 10 mg  10 mg Oral TID Rod Can, MD   10 mg at 11/26/21 2042   ipratropium-albuterol (DUONEB) 0.5-2.5 (3) MG/3ML nebulizer solution 3 mL  3 mL Nebulization Q6H PRN Rod Can, MD   3 mL at 11/27/21 0315   lidocaine (LIDODERM) 5 % 1 patch  1 patch Transdermal Q24H Swinteck, Aaron Edelman, MD   1 patch at 11/27/21 1102   lidocaine (LIDODERM) 5 % 1 patch  1 patch Transdermal Q24H Swinteck, Aaron Edelman,  MD   1 patch at 11/27/21 1102   lisinopril (ZESTRIL) tablet 20 mg  20 mg Oral Daily Swinteck, Aaron Edelman, MD   20 mg at 11/26/21 4827   menthol-cetylpyridinium (CEPACOL) lozenge 3 mg  1 lozenge Oral PRN Swinteck, Aaron Edelman, MD       Or   phenol (CHLORASEPTIC) mouth spray 1 spray  1 spray Mouth/Throat PRN Swinteck, Aaron Edelman, MD       metoCLOPramide (REGLAN) tablet 5-10 mg  5-10 mg Oral Q8H PRN Swinteck, Aaron Edelman, MD       Or   metoCLOPramide (REGLAN) injection 5-10 mg  5-10 mg Intravenous Q8H PRN Swinteck, Aaron Edelman, MD       multivitamin with minerals tablet 1 tablet  1 tablet Oral Daily Swinteck, Brian, MD   1 tablet at 11/26/21 0952   ondansetron (ZOFRAN) tablet 4 mg  4 mg Oral Q6H PRN Swinteck, Aaron Edelman, MD       Or   ondansetron (ZOFRAN) injection 4 mg  4 mg Intravenous Q6H PRN Swinteck, Aaron Edelman, MD       oxyCODONE (Oxy IR/ROXICODONE) immediate release tablet 2.5 mg  2.5 mg Oral Q4H PRN Rod Can, MD   2.5 mg at 11/27/21 0311   pantoprazole (PROTONIX) EC tablet 40 mg  40 mg Oral BID Darlina Sicilian, RPH   40 mg at 11/26/21 2042   polyethylene glycol (MIRALAX / GLYCOLAX) packet 17 g  17 g Oral Daily Rod Can, MD   17 g at 11/26/21 0954   potassium chloride (KLOR-CON) packet 40 mEq  40 mEq Oral BID Rick Duff, MD   40 mEq at 11/26/21 1738   QUEtiapine (SEROQUEL) tablet 25 mg  25 mg Oral Daily Rick Duff, MD   25 mg at 11/27/21 0309   senna (SENOKOT) tablet 8.6 mg  1 tablet Oral BID Rod Can, MD   8.6 mg at 11/26/21 2124   Vitamin D (Ergocalciferol) (DRISDOL) capsule 50,000 Units  50,000 Units Oral Q7 days Rod Can, MD   50,000 Units at 11/22/21 1052     Discharge Medications: Please see discharge summary for a list of discharge medications.  Relevant Imaging Results:  Relevant Lab Results:   Additional Information SSN [id number]. Pt is vaccinated for covid with 1 booster.  Joanne Chars, LCSW
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
## 2021-11-27 NOTE — Progress Notes (Addendum)
Pt given Morphine for increased resp.Martin Majestic in to check on pt 88mins later and ask family did they think he was more comfortable,and are they anything else I can do for you. Daughter was rude to me and stated"after what we([facility]) did to him. No further comments by family. Stated I would be back to check on him soon.

## 2021-11-27 NOTE — TOC Initial Note (Signed)
Transition of Care [facility]) - Initial/Assessment Note    Patient Details  Name: Sergio Chavez MRN: [id number] Date of Birth: 1941-09-08  Transition of Care [facility]) CM/SW Contact:    Joanne Chars, LCSW Phone Number: 11/27/2021, 12:03 PM  Clinical Narrative:  Pt oriented x1, unable to reach wife, first daughter on contact list, CSW called room and spoke with daughter Arrie Aran.  She confirms that they do want SNF, pt was at [facility] last year and that would be first choice, permission given to send out referral in hub.  [name] asked that pt wife [name] not be first contact as she is anxious and hard of hearing, please call daughters first.  Pt and wife live at home alone and have been independent.  Pt is vaccinated for covid with one booster.  Referral sent out in hub for SNF.                 Expected Discharge Plan: [place] Barriers to Discharge: Continued Medical Work up, SNF Pending bed offer   Patient Goals and CMS Choice        Expected Discharge Plan and Services Expected Discharge Plan: [place] In-house Referral: Clinical Social Work   Post Acute Care Choice: [place] Living arrangements for the past 2 months: [place]                                      Prior Living Arrangements/Services Living arrangements for the past 2 months: Single Family Home Lives with:: Spouse Patient language and need for interpreter reviewed:: No        Need for Family Participation in Patient Care: Yes (Comment) Care giver support system in place?: Yes (comment) Current home services: Other (comment) (none) Criminal Activity/Legal Involvement Pertinent to Current Situation/Hospitalization: No - Comment as needed  Activities of Daily Living Home Assistive Devices/Equipment: None ADL Screening (condition at time of admission) Patient's cognitive ability adequate to safely complete daily activities?: Yes Is the patient  deaf or have difficulty hearing?: Yes Does the patient have difficulty seeing, even when wearing glasses/contacts?: Yes Does the patient have difficulty concentrating, remembering, or making decisions?: Yes Patient able to express need for assistance with ADLs?: No Does the patient have difficulty dressing or bathing?: Yes Independently performs ADLs?: No Does the patient have difficulty walking or climbing stairs?: Yes Weakness of Legs: Both Weakness of Arms/Hands: None  Permission Sought/Granted                  Emotional Assessment       Orientation: : Oriented to Self Alcohol / Substance Use: Not Applicable Psych Involvement: No (comment)  Admission diagnosis:  Femoral neck fracture ([place]) [S72.009A] Pain [R52] Fall [W19.XXXA] Hypoxia [R09.02] Fall, initial encounter [id number]] Closed displaced fracture of right femoral neck (HCC) [S72.001A] Closed left hip fracture ([place]) [S72.002A] Patient Active Problem List   Diagnosis Date Noted   Acute delirium    Protein-calorie malnutrition, severe 12/02/2021   Abdominal aortic aneurysm (AAA) without rupture    Advanced care planning/counseling discussion    Palliative care by specialist    Coffee ground emesis    Acute blood loss anemia    Femoral neck fracture ([place]) 11/26/2021   Tobacco use 12/10/2020   Mixed hyperlipidemia 12/10/2020   History of throat cancer 12/10/2020   S/p left hip fracture 12/10/2020   Hyponatremia 12/04/2020  Essential hypertension 12/04/2020   Hip fracture ([place]) 12/04/2020   Closed displaced fracture of right femoral neck ([place]) 12/03/2020   PCP:  Saralyn Pilar, MD Pharmacy:   [facility] [place], [address] AT [address] [place] [address] Phone: [phone] Fax: [phone]     Social Determinants of Health (SDOH) Interventions    Readmission Risk Interventions No flowsheet data found.

## 2021-11-28 ENCOUNTER — Encounter (HOSPITAL_COMMUNITY): Payer: Self-pay | Admitting: Orthopedic Surgery

## 2021-11-28 DIAGNOSIS — S72009A Fracture of unspecified part of neck of unspecified femur, initial encounter for closed fracture: Secondary | ICD-10-CM | POA: Diagnosis not present

## 2021-11-28 DIAGNOSIS — E43 Unspecified severe protein-calorie malnutrition: Secondary | ICD-10-CM

## 2021-11-28 DIAGNOSIS — Z515 Encounter for palliative care: Secondary | ICD-10-CM | POA: Diagnosis not present

## 2021-11-28 MED ORDER — MORPHINE BOLUS VIA INFUSION
2.0000 mg | INTRAVENOUS | Status: DC | PRN
  Administered 2021-11-28: 2 mg via INTRAVENOUS
  Filled 2021-11-28: qty 2

## 2021-11-28 MED ORDER — MORPHINE 100MG IN NS 100ML (1MG/ML) PREMIX INFUSION
4.0000 mg/h | INTRAVENOUS | Status: DC
  Administered 2021-11-28: 2 mg/h via INTRAVENOUS
  Filled 2021-11-28: qty 100

## 2021-11-28 MED ORDER — MORPHINE BOLUS VIA INFUSION
4.0000 mg | INTRAVENOUS | Status: DC | PRN
  Administered 2021-11-28: 4 mg via INTRAVENOUS
  Filled 2021-11-28: qty 4

## 2021-12-07 NOTE — Progress Notes (Signed)
Pt has been unresponsive this shift. Have attempted aleast 7 times to reposition patient. Daughter has refused each time. Will continue to monitor. Wife, 2 daughters and son in law have remained at bedside.

## 2021-12-07 NOTE — Progress Notes (Signed)
This chaplain responded to RN consult for a chaplain presence as soon as possible.  The chaplain understands the Pt. has transitioned to comfort care.   The Pt. daughters Neoma Laming and Arrie Aran are at the bedside, along with the Pt. wife. The family is holding the Pt. hand and offering the gift of touch.  In this sacred space, the chaplain accepted the request for prayer and scripture reading.  This chaplain is available for F/U spiritual care as needed.  Chaplain Sallyanne Kuster [phone]

## 2021-12-07 NOTE — Consult Note (Signed)
[place] Nurse wound follow up Patient receiving care in [facility] 6N13 Patient now transitioned over to full comfort care. WOC will sign off at this time.  Cathlean Marseilles Tamala Julian, MSN, RN, [place], Lysle Pearl, [facility] Wound Treatment Associate Pager [phone]

## 2021-12-07 NOTE — Death Summary Note (Signed)
Name: Sergio Chavez MRN: [id number] DOB: 23-Dec-1940 81 y.o.  Date of Admission: 11/11/2021  1:14 PM Date of Discharge: 12/26/21 Attending Physician: Velna Ochs, MD  Discharge Diagnosis: Principal Problem:   End of life care Active Problems:   Closed displaced fracture of right femoral neck (HCC)   Femoral neck fracture ([place])   Abdominal aortic aneurysm (AAA) without rupture   Palliative care by specialist   Coffee ground emesis   Acute blood loss anemia   Protein-calorie malnutrition, severe   Acute delirium   Acute pulmonary embolism without acute cor pulmonale (HCC)   Acute respiratory failure with hypoxia (HCC)  Cause of death: acute hypoxic respiratory failure Time of death: 5:37 pm   Disposition and follow-up:   Sergio Chavez was discharged from [facility] in expired condition.    Hospital Course:  Comfort care  Unfortunately patient had further clinical decline over the next couple of days after his surgical hip pinning, with respiratory failure found to have multiple bilateral pulmonary embolisms and pleural effusions. He was transitioned to full comfort care on 2/19 with Haldol, Zofran, Reglan, Lorazepam, and morphine added. He is surrounded by family. He is agonally breathing and appears to be in the active stages of dying on 2/20. We transitioned him to a continuous morphine infusion for better symptom control on 2/20 AM. Anticipated in hospital death within hours to days. Held off on inpatient hospice transfer. Paged by RN at 5:37 pm that pt passed away. His family was with him during this time.   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 Then later favoring palliative care with no  plan for vascular surgery. This delayed pinning for 5 days due to constant changes in plan. Pt was held off on VTE ppx due to anticipated surgery. 50,000 U Vit ordered.  Successful surgical pinning on 2/19. Per ortho WBAT and patient can sit up in his bed. Pain medications on board, and adequately controlled. Bowel regimen in place. PT/OT following.   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 Vascular consulted. Not candidate for open surgery d/t comorbidities. Options include infrarenal AAA repair with bilateral femoral endartectomies (possible Wednesday) vs transfer to [facility] vs palliative care. Plan bilateral femoral endartectomies, and then discontinued given plan to move forward with palliative instead. This delayed ortho pinning and started VTE ppx.  - Palliative care following, appreciate assistance    Increased O2 requirement Patient had increased O2 requirement from 3L to 5L s/p surgical pinning. He underwent a CTA chest and was noted to have PE seen within anterior right upper lobe and posteromedial lower lobe branches of the right pulmonary artery. He was also noted to have small to moderate sized partially loculated pleural effusions. Eliquis started. Antibiotics were held due to patient's normal WBC and negative procalcitonin. He then returned to 3L the next day. His WBC count remains normal and Hgb is stable. Shortly after, he was transitioned to full comfort care before any further workup could be started for effusions. Eliquis discontinued.    Severe chronic asymptomatic hypertension Difficult to control HTN this admission requiring: - amlodipine 10 mg daily  - Hydralazine 10 mg TID  - Lisinopril 20 mg  Hospital delirium   Ongoing. Pulling at lines and tubes in the evening despite trazodone, but pt is redirectable with the help of family. - Seroquel 25 mg at bedtime at 1900   AKI, resolved  Hyponatremia    Resolved with IVF   Possible Upper GI bleed vs gastritis  Possible coffee ground emesis with new AKI and elevated BUN. GI consulted; offered EGD workup refused. Hgb stable.  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 Smoking cessation has been encouraged in the past, but not interested in quitting.  1 cm incidental irregular nodule concerning for neoplasm found on CTA this admission.  - Nicotine patches  - Palliative care following, appreciate assistance    Incidental CTA findings  Numerous hepatic cysts, and additional too small to characterize hypodensities in the left kidney. - plan for outpatient follow up prior to transition to comfort    Signed: Lajean Manes, MD 2021-12-28, 7:20 PM

## 2021-12-07 NOTE — Progress Notes (Signed)
Daughter refuses to have father repositioned.

## 2021-12-07 NOTE — Progress Notes (Addendum)
° °  Subjective: Overnight events: none  Pt is seen at bedside during rounds today. His family is at bedside with him. He was transitioned to full comfort care yesterday evening after speaking with palliative care team. He has agonal breathing. He does not appear to be in any pain. Talked family about starting morphine drip for added comfort but that it could hasten the passing process, they would like to proceed with the drip at this time.   Objective:  Vital signs in last 24 hours: Vitals:   11/27/21 0230 11/27/21 0435 11/27/21 0912 11/27/21 1624  BP: (!) 161/103 106/70 139/81 (!) 188/97  Pulse: 93 95 78 84  Resp: 20 20 20 20   Temp: 97.7 F (36.5 C) (!) 97.5 F (36.4 C) 97.9 F (36.6 C) 97.8 F (36.6 C)  TempSrc: Axillary Axillary Axillary Axillary  SpO2: 92% 96% 90% 97%  Weight:      Height:       Constitutional: chronically ill-appearing  Cardiovascular: RRR, no m/r/g, non-edematous bilateral LE Pulmonary/Chest: agonal breathing  Abdominal: non-distended, soft, non-TTP Skin: warm and dry, some bruises along left arm and right hand, no skin breakdown of heels.   Assessment/Plan:  Principal Problem:   Femoral neck fracture (HCC) Active Problems:   Closed displaced fracture of right femoral neck (HCC)   Abdominal aortic aneurysm (AAA) without rupture   Advanced care planning/counseling discussion   Palliative care by specialist   Coffee ground emesis   Acute blood loss anemia   Protein-calorie malnutrition, severe   Acute delirium   Acute pulmonary embolism without acute cor pulmonale (HCC)   Acute respiratory failure with hypoxia ([place])  Comfort care  Family proceeding with comfort care at this time for this patient with numerous co-morbidities and acute medical problems. Has agonal breathing this AM; suspect pt is actively passing at this time and anticipate hospital death in the next 1-2 days.  - Palliative following, greatly appreciate assistance with complicated  case  - TOC consulted for residential hospice placement, ask to hold off for now - Spiritual care consulted  - Haldol prn  - Zofran and Reglan prn  - Lorazepam 1mg  q4h prn  - Morphine IV 2mg  q30 mins prn, lidocaine patches, tylenol prn  - Dulcolax suppository and Senna  - No labs   Acute right femoral neck fracture s/p surgically pinning 2/17 Severe compression fracture of the T6 vertebral body Pain medications on board (see above).   Pulmonary embolism (11/26/21)  Plural effusions in s/o incidental irregular left upper lobe nodule  Comfort care at this time. Discontinued Eliquis.   Thoracoabdominal aortic aneurysm Severe chronic asymptomatic hypertension Comfort care at this time. Discontinued HTN medications.   Possible Upper GI bleed vs gastritis  Comfort care at this time. Discontinued Protonix, and not following CBC.    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     Best Practice: Diet: Regular   IVF: None Code: DNR  Lajean Manes, MD  Internal Medicine Resident, PGY-1 [facility]

## 2021-12-07 NOTE — Progress Notes (Signed)
Daughter refusing for patient to be turned or repositioned

## 2021-12-07 NOTE — Plan of Care (Signed)
°  Problem: Role Relationship: Goal: Family's ability to cope with current situation will improve Outcome: Not Progressing

## 2021-12-07 NOTE — Progress Notes (Signed)
Patient family refusing wound care for patient. They do not want him moved.
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
## 2021-12-07 NOTE — Progress Notes (Deleted)
Name: Sergio Chavez MRN: [id number] DOB: Feb 08, 1941 81 y.o.  Date of Admission: 11/16/2021  1:14 PM Date of Discharge: Dec 20, 2021 Attending Physician: Velna Ochs, MD  Discharge Diagnosis: Principal Problem:   End of life care Active Problems:   Closed displaced fracture of right femoral neck (HCC)   Femoral neck fracture ([place])   Abdominal aortic aneurysm (AAA) without rupture   Palliative care by specialist   Coffee ground emesis   Acute blood loss anemia   Protein-calorie malnutrition, severe   Acute delirium   Acute pulmonary embolism without acute cor pulmonale (HCC)   Acute respiratory failure with hypoxia (HCC)  Cause of death: acute hypoxic respiratory failure Time of death: 5:37 pm   Disposition and follow-up:   [name] was discharged from [facility] in expired condition.    Hospital Course:  Comfort care  Unfortunately patient had further clinical decline over the next couple of days after his surgical hip pinning, with respiratory failure found to have multiple bilateral pulmonary embolisms and pleural effusions. He was transitioned to full comfort care on 2/19 with Haldol, Zofran, Reglan, Lorazepam, and morphine added. He is surrounded by family. He is agonally breathing and appears to be in the active stages of dying on 2/20. We transitioned him to a continuous morphine infusion for better symptom control on 2/20 AM. Anticipated in hospital death within hours to days. Held off on inpatient hospice transfer. Paged by RN at 5:37 pm that pt passed away. His family was with him during this time.   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 Then later favoring palliative care with no  plan for vascular surgery. This delayed pinning for 5 days due to constant changes in plan. Pt was held off on VTE ppx due to anticipated surgery. 50,000 U Vit ordered.  Successful surgical pinning on 2/19. Per ortho WBAT and patient can sit up in his bed. Pain medications on board, and adequately controlled. Bowel regimen in place. PT/OT following.   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 Vascular consulted. Not candidate for open surgery d/t comorbidities. Options include infrarenal AAA repair with bilateral femoral endartectomies (possible Wednesday) vs transfer to [facility] vs palliative care. Plan bilateral femoral endartectomies, and then discontinued given plan to move forward with palliative instead. This delayed ortho pinning and started VTE ppx.  - Palliative care following, appreciate assistance    Increased O2 requirement Patient had increased O2 requirement from 3L to 5L s/p surgical pinning. He underwent a CTA chest and was noted to have PE seen within anterior right upper lobe and posteromedial lower lobe branches of the right pulmonary artery. He was also noted to have small to moderate sized partially loculated pleural effusions. Eliquis started. Antibiotics were held due to patient's normal WBC and negative procalcitonin. He then returned to 3L the next day. His WBC count remains normal and Hgb is stable. Shortly after, he was transitioned to full comfort care before any further workup could be started for effusions. Eliquis discontinued.    Severe chronic asymptomatic hypertension Difficult to control HTN this admission requiring: - amlodipine 10 mg daily  - Hydralazine 10 mg TID  - Lisinopril 20 mg  Hospital delirium   Ongoing. Pulling at lines and tubes in the evening despite trazodone, but pt is redirectable with the help of family. - Seroquel 25 mg at bedtime at 1900   AKI, resolved  Hyponatremia    Resolved with IVF   Possible Upper GI bleed vs gastritis  Possible coffee ground emesis with new AKI and elevated BUN. GI consulted; offered EGD workup refused. Hgb stable.  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 Smoking cessation has been encouraged in the past, but not interested in quitting.  1 cm incidental irregular nodule concerning for neoplasm found on CTA this admission.  - Nicotine patches  - Palliative care following, appreciate assistance    Incidental CTA findings  Numerous hepatic cysts, and additional too small to characterize hypodensities in the left kidney. - plan for outpatient follow up prior to transition to comfort    Signed: Lajean Manes, MD 12/04/2021, 7:20 PM

## 2021-12-07 NOTE — Progress Notes (Signed)
Update   Pt evaluated at bedside with family. He is agonally breathing and appears to be nearing end of life. Started morphine drip with family's permission for comfort. Full note to follow. Hold off on inpatient hospice transfer at this time.

## 2021-12-07 NOTE — Progress Notes (Signed)
Patients family refusing that patient be turned every 2 hours

## 2021-12-07 DEATH — deceased

## 2022-02-06 MED FILL — Medication: Qty: 1 | Status: AC

## 2022-05-24 IMAGING — DX DG PORTABLE PELVIS
1 series · 1 of 1 positions shown · non-contrast
Comparison: CT examination dated November 20, 2021

CLINICAL DATA: Displaced fracture of the right femoral neck.

EXAM:
PORTABLE PELVIS 1-2 VIEWS

[pelvis]
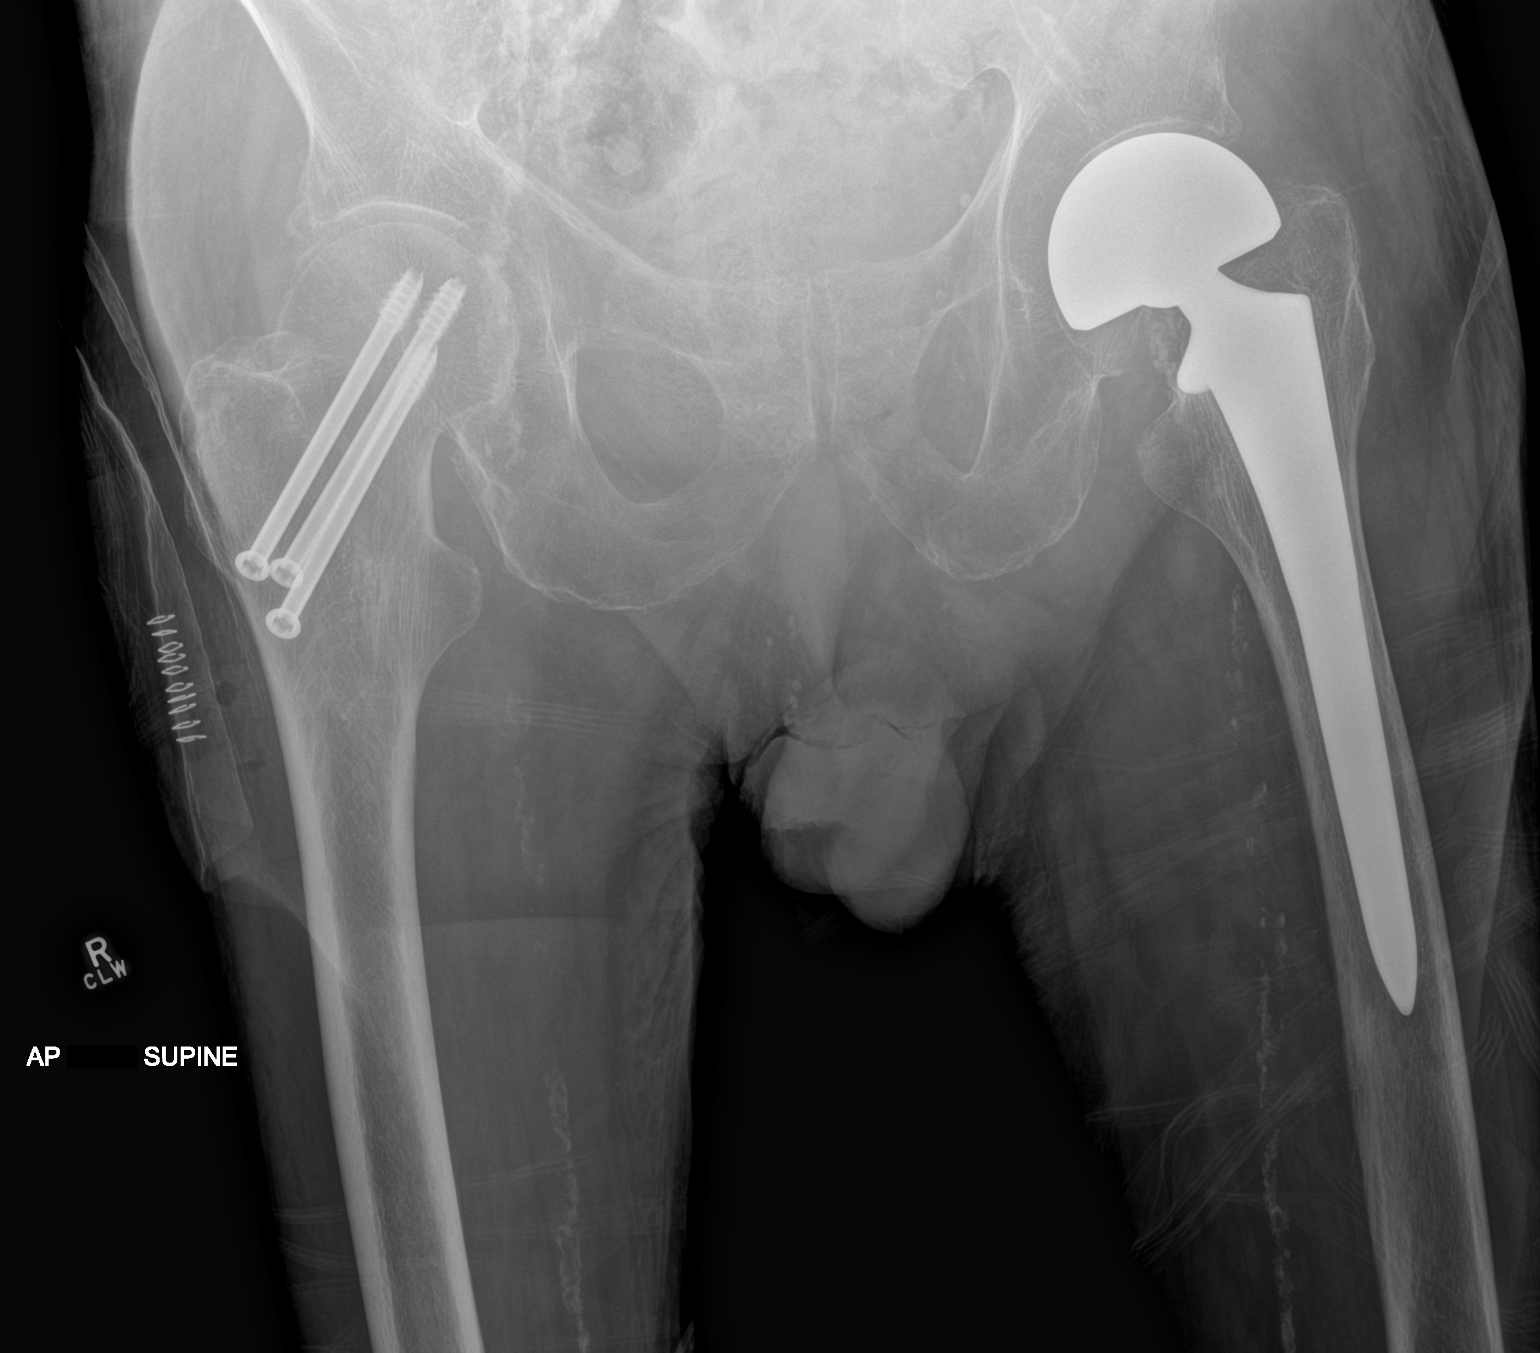

[1 of 1 positions shown; findings below may reference images not displayed]

FINDINGS: Status post ORIF with 3 cannulated screws in the right femoral neck
for mildly displaced femoral neck fracture. Surgical clips and
subcutaneous emphysema as expected. Status post left hip
arthroplasty with intact hardware. Diffuse osteopenia. Prominent
vascular calcifications.
IMPRESSION: Status post ORIF for right femoral neck fracture.

## 2022-05-24 IMAGING — RF DG HIP (WITH PELVIS) OPERATIVE*R*
1 series · 2 of 2 positions shown · non-contrast
Comparison: 11/20/2021

CLINICAL DATA: Right hip pinning

EXAM:
OPERATIVE RIGHT HIP (WITH PELVIS IF PERFORMED) 2 VIEWS
TECHNIQUE: Fluoroscopic spot image(s) were submitted for interpretation
post-operatively.

[Series 1: run · 2 of 2 slices shown]
[im 1/2]
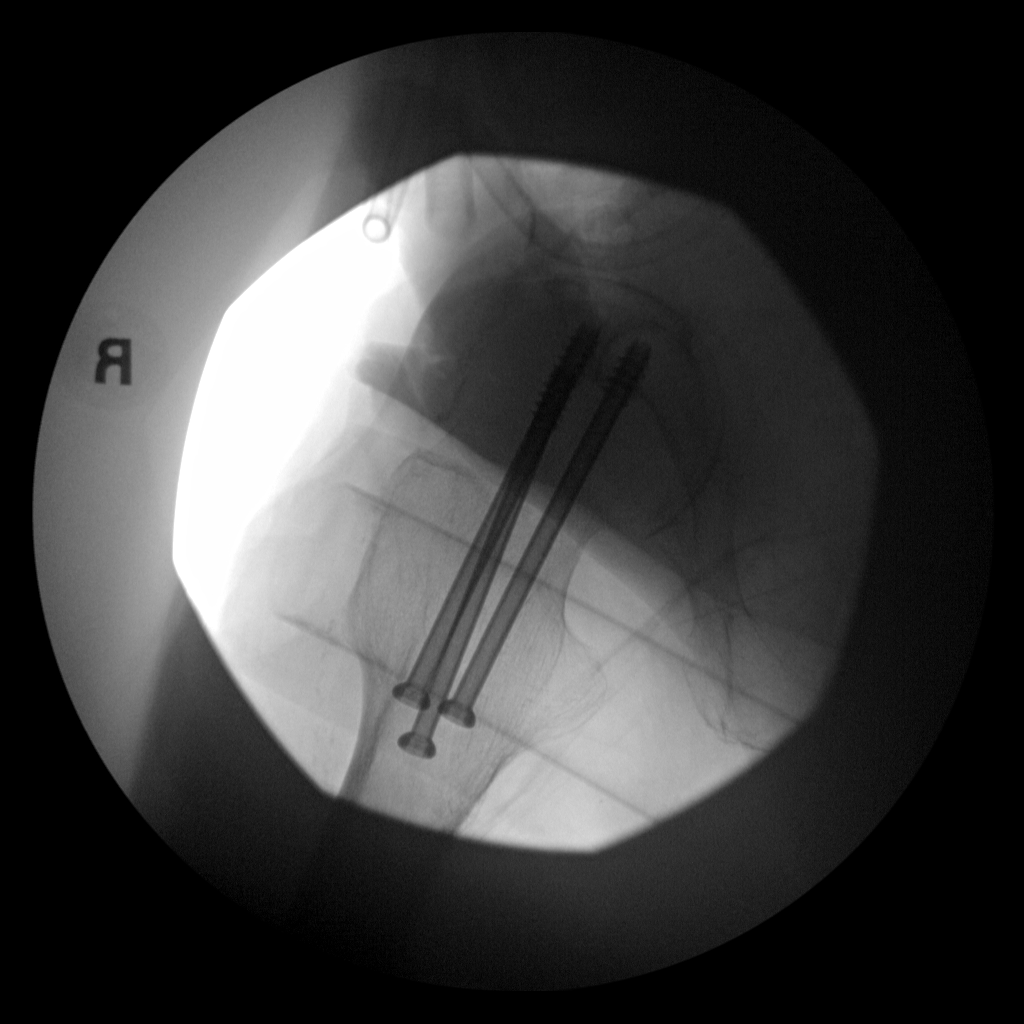
[im 2/2]
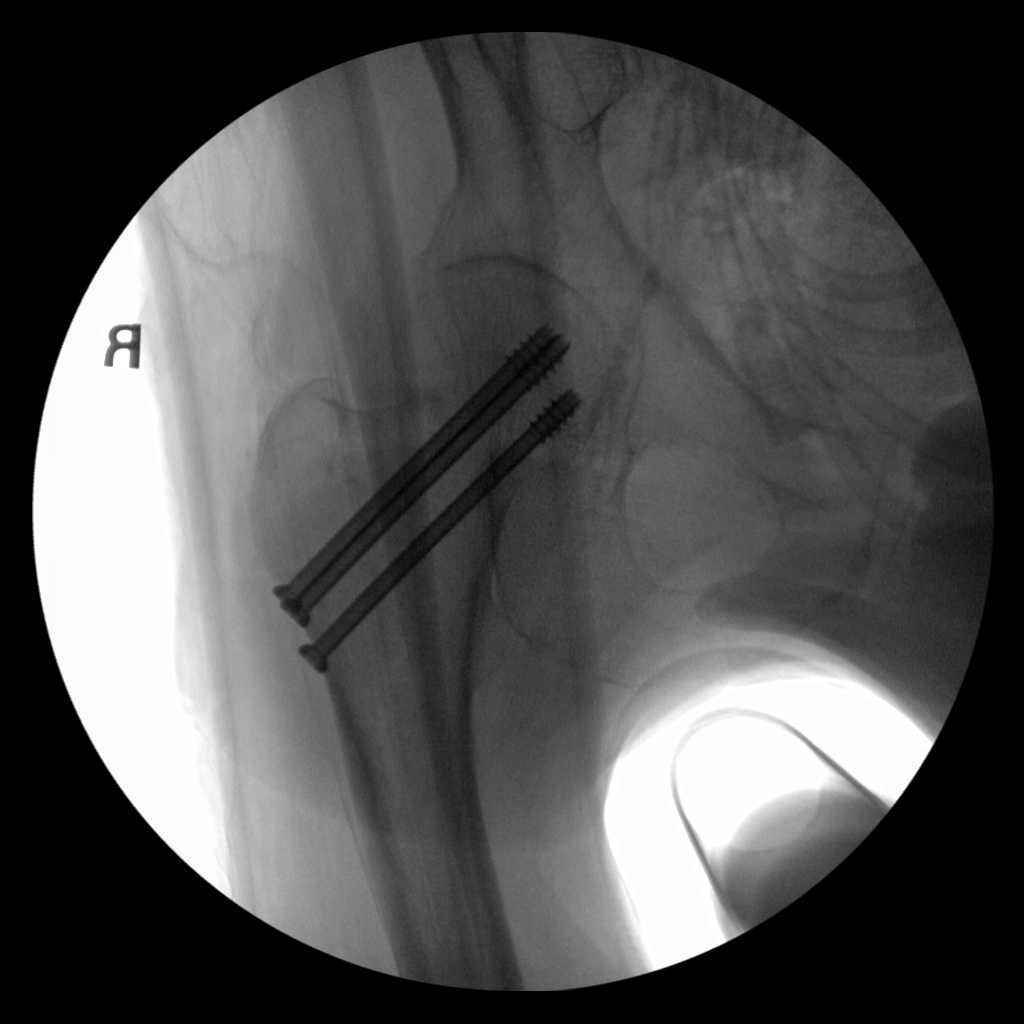

[2 of 2 positions shown; findings below may reference images not displayed]

FINDINGS: Two fluoroscopic images are obtained during the performance of the
procedure and are provided for interpretation only. Three cannulated
screws are seen traversing the subcapital right femoral neck
fracture seen previously. Alignment is anatomic. Please refer to the
operative report.

Fluoroscopy time: 1 minute 18 seconds, 10.32 mGy
IMPRESSION: 1. Right hip pinning as above.  Anatomic alignment.

## 2022-05-25 IMAGING — CT CT ANGIO CHEST
2 of 7 series · 17 of 46 positions shown · IV contrast (agent unspecified)
Comparison: November 21, 2021

CLINICAL DATA: Elevated D-dimer.

EXAM:
CT ANGIOGRAPHY CHEST WITH CONTRAST
TECHNIQUE: Multidetector CT imaging of the chest was performed using the
standard protocol during bolus administration of intravenous
contrast. Multiplanar CT image reconstructions and MIPs were
obtained to evaluate the vascular anatomy.

[Series 7: thins · axial · 0.64mm/px · z∈[+1075,+1336]mm · 14 of 420 slices shown]
[im 24/420  lung]
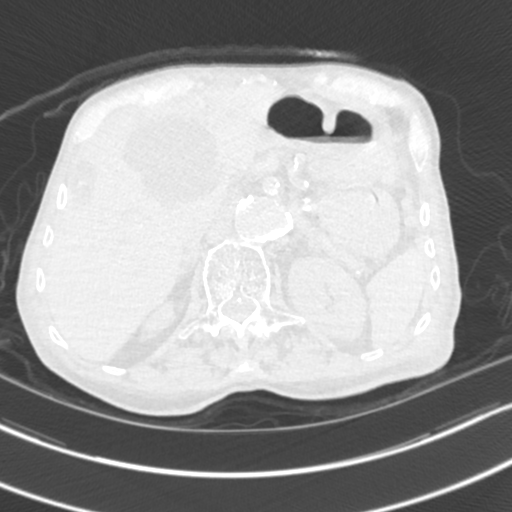
[im 47/420  soft-tissue]
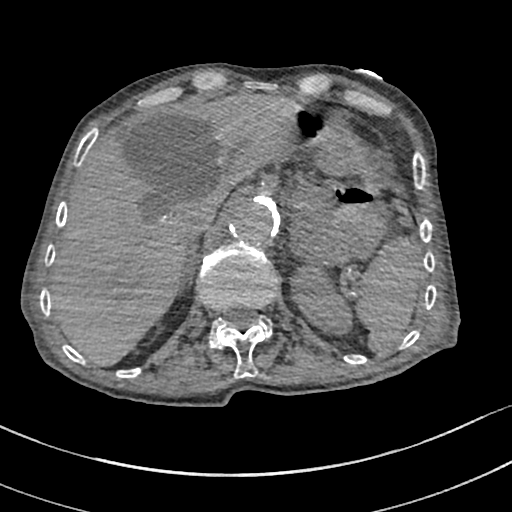
[im 94/420  lung]
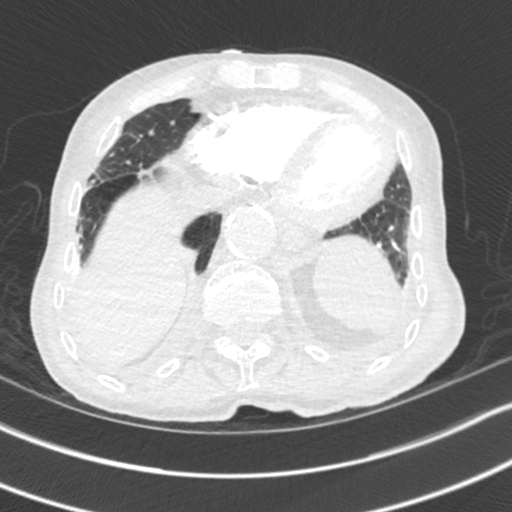
[im 117/420  soft-tissue]
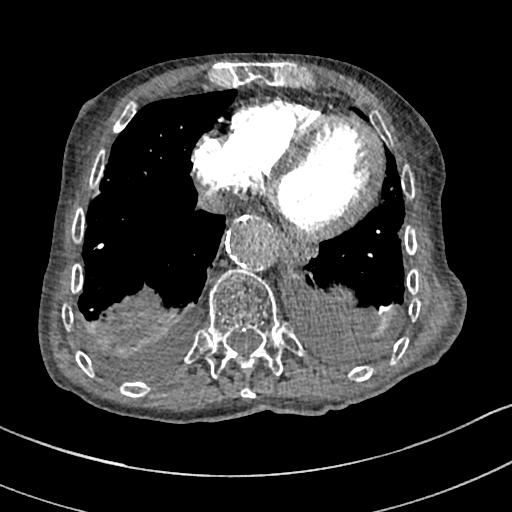
[im 140/420  lung]
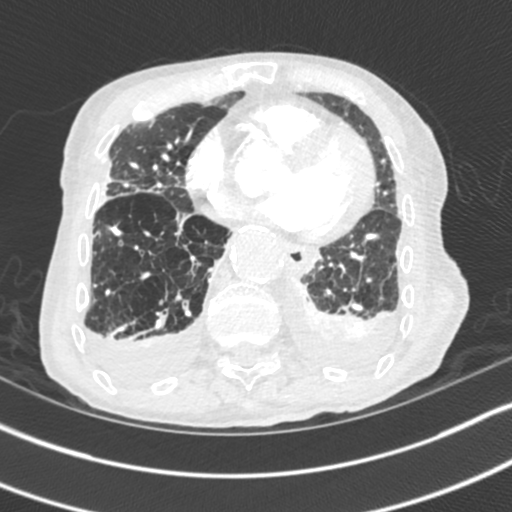
[im 163/420  soft-tissue]
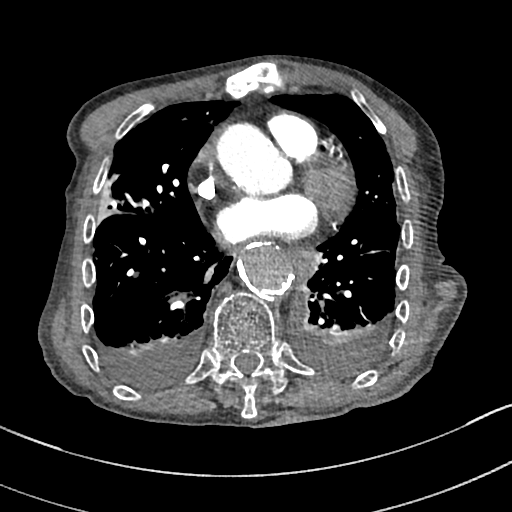
[im 187/420  lung]
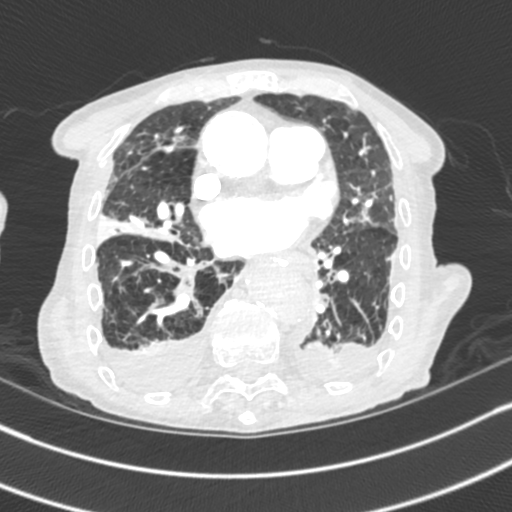
[im 233/420  soft-tissue]
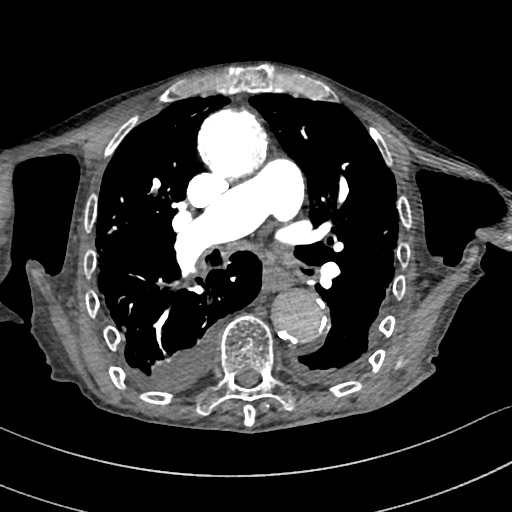
[im 257/420  lung]
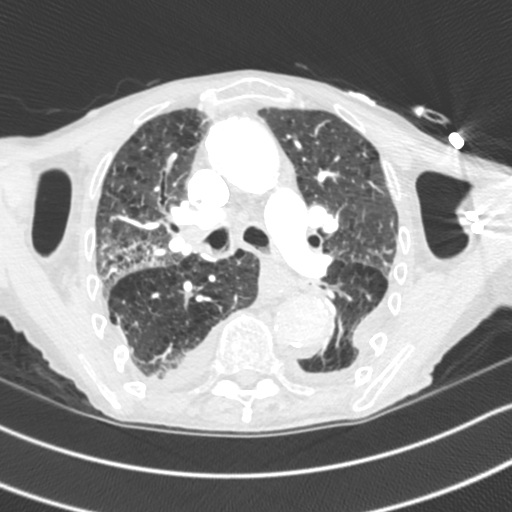
[im 280/420  soft-tissue]
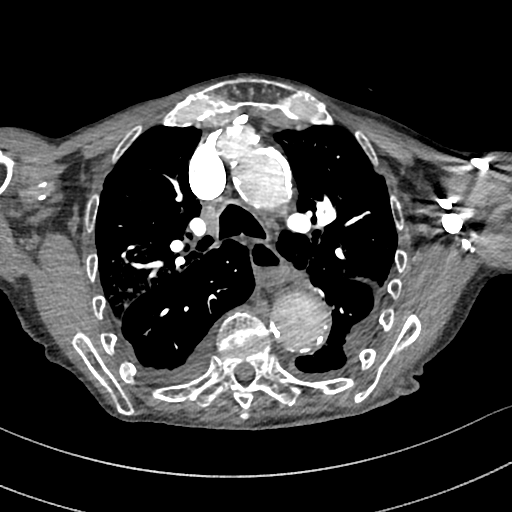
[im 303/420  lung]
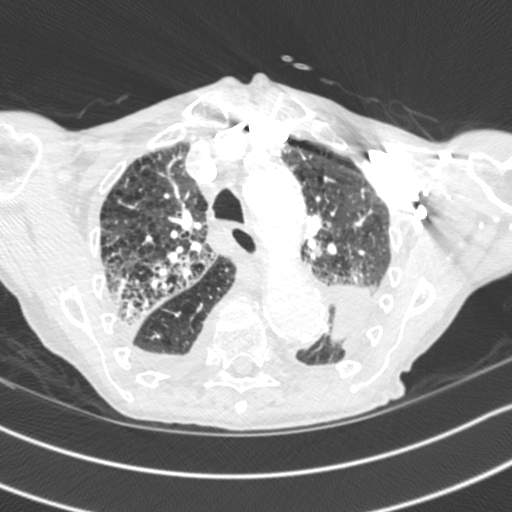
[im 326/420  soft-tissue]
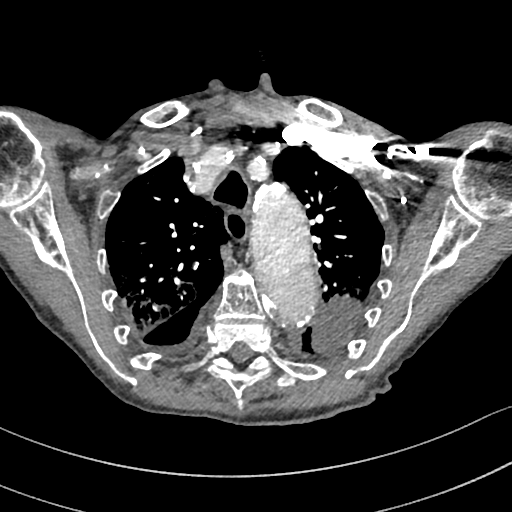
[im 373/420  lung]
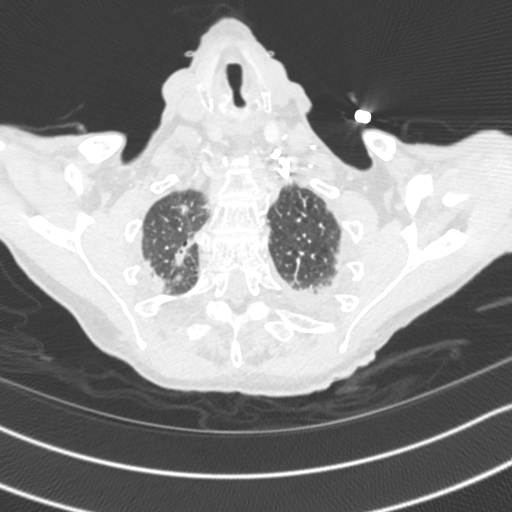
[im 396/420  soft-tissue]
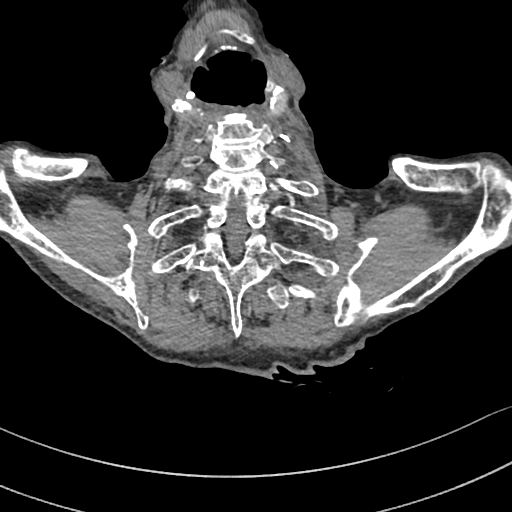

[Series 8: cor · coronal · 0.58mm/px · 3 of 160 slices shown]
[im 40/160  soft-tissue]
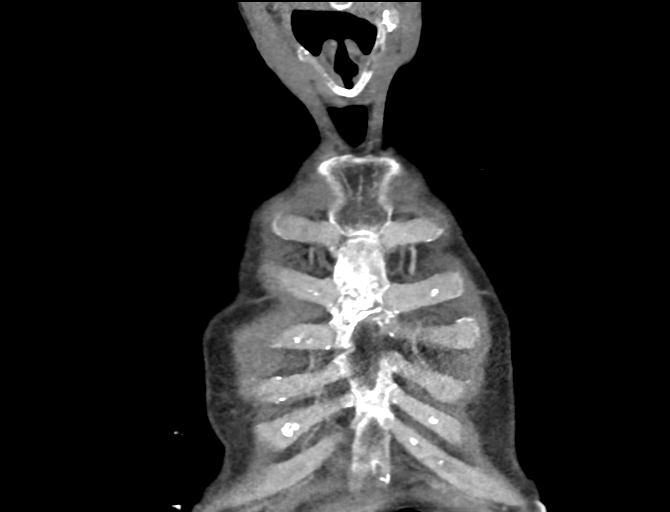
[im 80/160  soft-tissue]
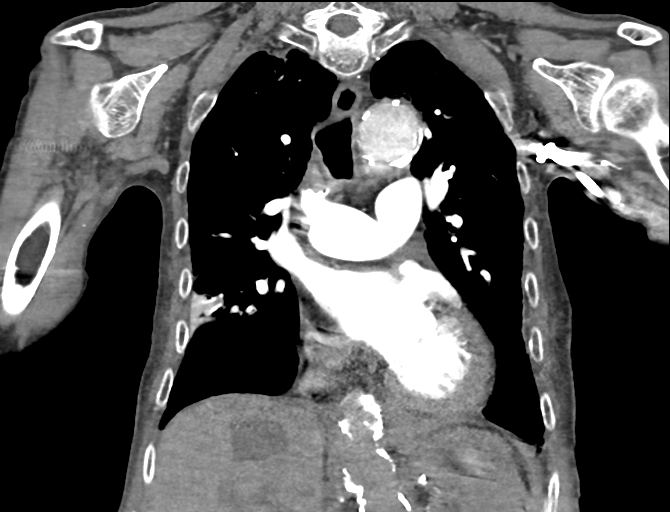
[im 120/160  soft-tissue]
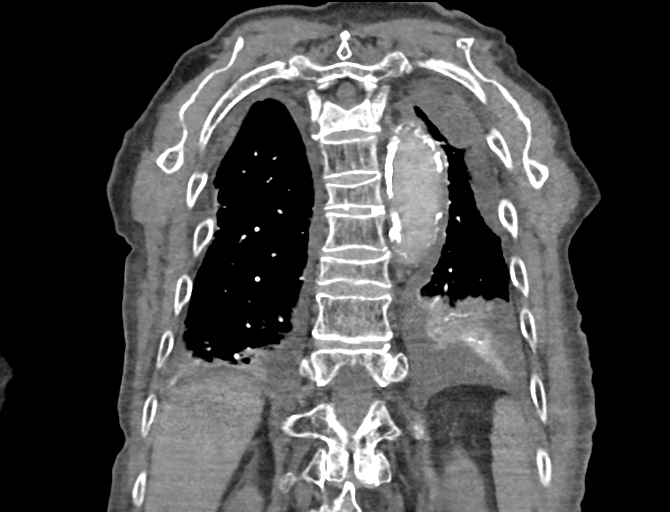

[17 of 46 positions shown; findings below may reference images not displayed]

RADIATION DOSE REDUCTION: This exam was performed according to the
departmental dose-optimization program which includes automated
exposure control, adjustment of the mA and/or kV according to
patient size and/or use of iterative reconstruction technique.

CONTRAST:  75mL OMNIPAQUE IOHEXOL 350 MG/ML SOLN
FINDINGS: Cardiovascular: There is marked severity calcification of the
thoracic aorta. The ascending thoracic aorta measures approximately
4.2 cm in diameter. Satisfactory opacification of the pulmonary
arteries to the segmental level. A mild amount of intraluminal low
attenuation is seen within anterior right upper lobe branches of the
right pulmonary artery (axial CT images 58 through 60 and image 85,
CT series 5). A small amount of intraluminal low attenuation is also
seen within the posteromedial lower lobe branch of the right
pulmonary artery (axial CT image 92, CT series 5). Normal heart
size. No pericardial effusion.

Mediastinum/Nodes: No enlarged mediastinal, hilar, or axillary lymph
nodes. Thyroid gland, trachea, and esophagus demonstrate no
significant findings.

Lungs/Pleura: Marked severity right apical scarring and/or
atelectasis is seen.

Moderate severity posterior bilateral upper lobe atelectasis and/or
infiltrate is present.

Mild atelectatic changes are seen within the posterior aspects of
the right middle lobe and bilateral lung bases.

Small to moderate size bilateral pleural effusions are present with
loculated components seen along the posteromedial aspect of the
right lower lobe and anterolateral aspect of the left upper lobe.

No pneumothorax is identified.

Upper Abdomen: Multiple large simple cystic appearing structures are
seen within the anterior aspect of the right lobe of the liver.

The right kidney is atrophic in appearance.

Musculoskeletal: A chronic fracture of the body of the sternum is
noted.

Chronic compression fractures of the T6, T8, T9 and T10 vertebral
bodies are seen.

Review of the MIP images confirms the above findings.
IMPRESSION: 1. Mild amount of pulmonary embolism seen within anterior right
upper lobe and posteromedial lower lobe branches of the right
pulmonary artery.
2. Moderate severity posterior bilateral upper lobe atelectasis
and/or infiltrate.
3. Small to moderate size bilateral, partially loculated pleural
effusions.
4. Marked severity right apical scarring and/or atelectasis.
5. Chronic compression fractures of the T6, T8, T9 and T10 vertebral
bodies.
6. Multiple large simple hepatic cysts.
7. Chronic fracture of the body of the sternum.

Aortic Atherosclerosis ([id number]).

## 2022-05-25 IMAGING — DX DG CHEST 1V PORT
1 series · 1 of 1 positions shown · non-contrast
Comparison: November 21, 21.

CLINICAL DATA: Oxygen desaturation PGY.GR (YDY-VB-CM)

EXAM:
PORTABLE CHEST 1 VIEW

[chest ap]
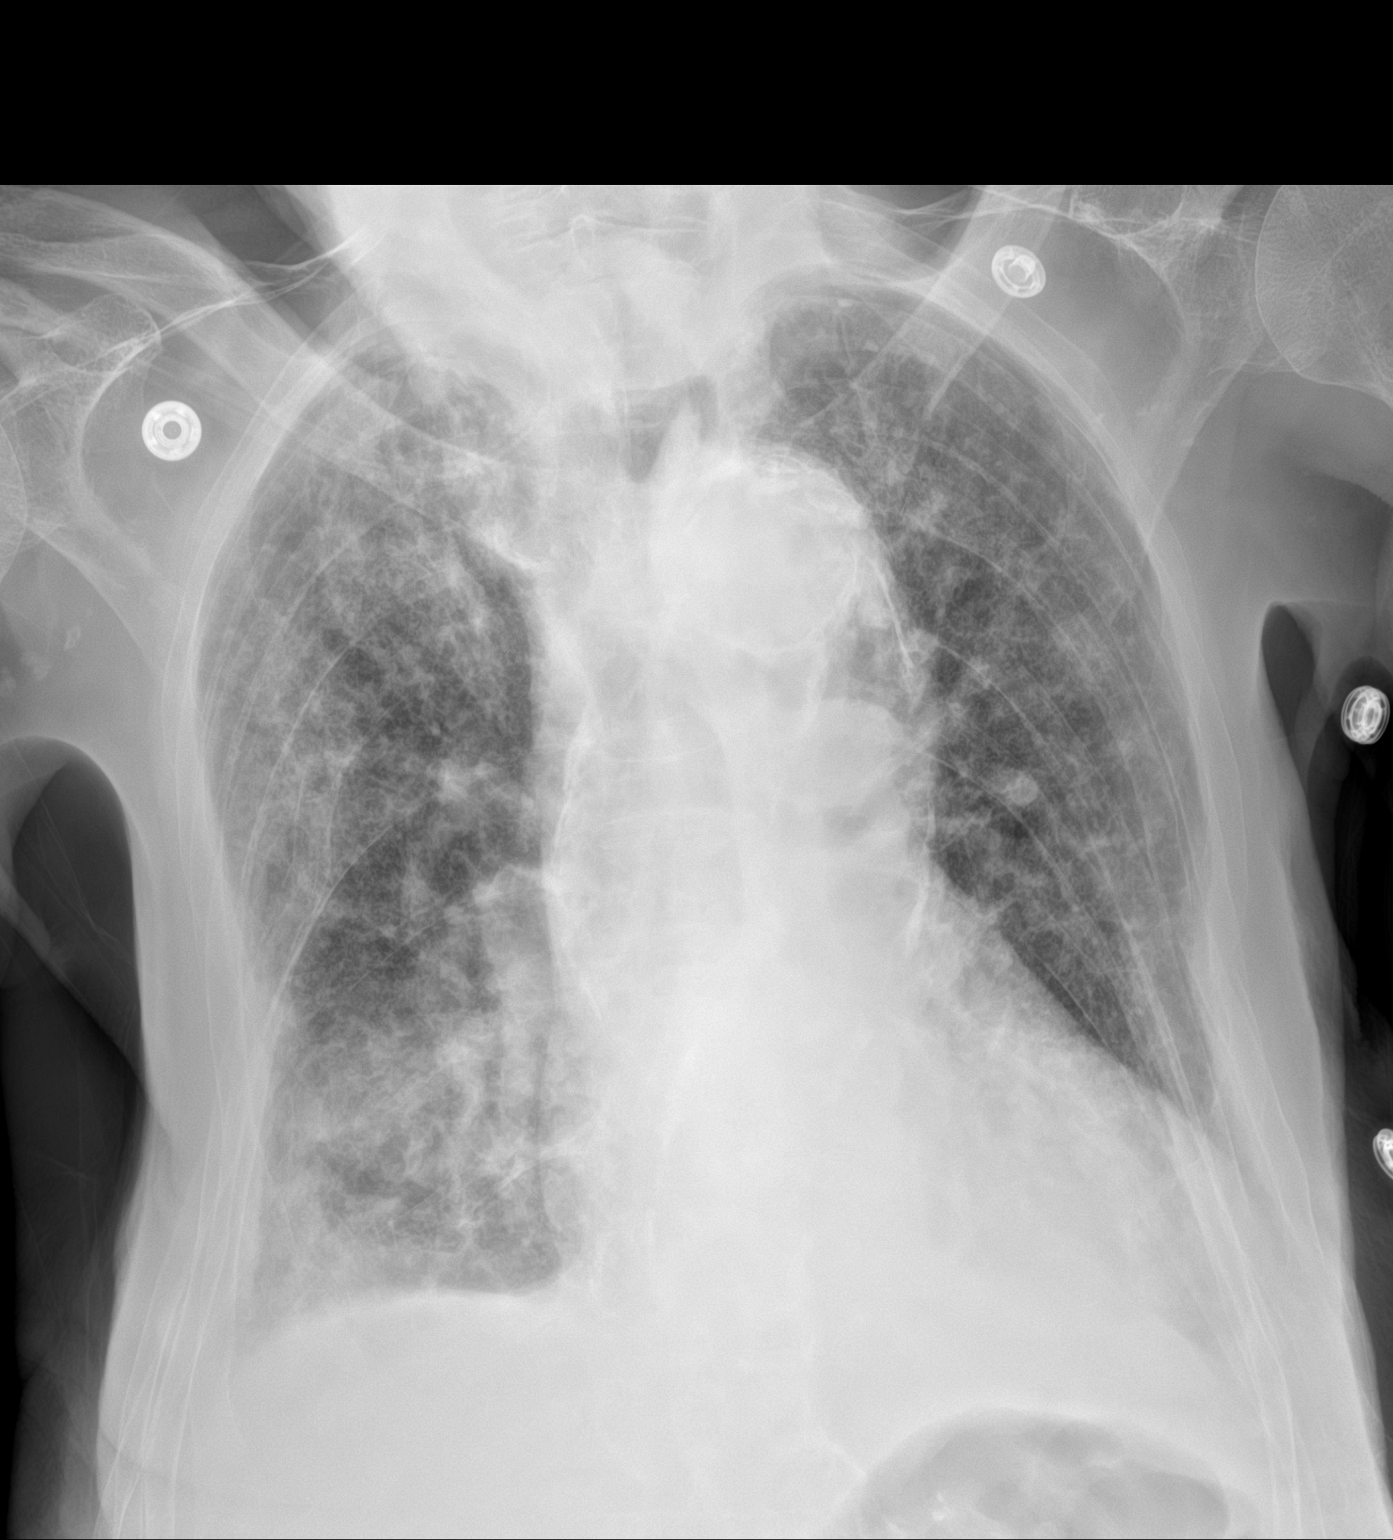

[1 of 1 positions shown; findings below may reference images not displayed]

FINDINGS: Increased patchy interstitial and airspace opacities in the right
greater than left lungs. No visible pleural effusions or
pneumothorax. When accounting for differences in technique, similar
enlarged cardiomediastinal silhouette with aortic aneurysm better
characterized on recent CT chest. Extensive calcific
atherosclerosis. Known pulmonary nodule also better characterized on
recent CT chest. No visible pneumothorax or large pleural effusions.
IMPRESSION: Increased patchy interstitial and airspace opacities in the right
greater than left lungs, concerning for multifocal pneumonia and/or
pulmonary edema.
